# Patient Record
Sex: Female | Born: 1970 | Race: White | Hispanic: No | Marital: Single | Smoking: Never smoker
Health system: Southern US, Community
[De-identification: ages and names within clinical notes are randomized; demographics above are authoritative.]

## PROBLEM LIST (undated history)

## (undated) DIAGNOSIS — F419 Anxiety disorder, unspecified: Secondary | ICD-10-CM

## (undated) DIAGNOSIS — I739 Peripheral vascular disease, unspecified: Secondary | ICD-10-CM

## (undated) DIAGNOSIS — Z9289 Personal history of other medical treatment: Secondary | ICD-10-CM

## (undated) DIAGNOSIS — E781 Pure hyperglyceridemia: Secondary | ICD-10-CM

## (undated) DIAGNOSIS — F329 Major depressive disorder, single episode, unspecified: Secondary | ICD-10-CM

## (undated) DIAGNOSIS — N200 Calculus of kidney: Secondary | ICD-10-CM

## (undated) DIAGNOSIS — E7800 Pure hypercholesterolemia, unspecified: Secondary | ICD-10-CM

## (undated) DIAGNOSIS — K760 Fatty (change of) liver, not elsewhere classified: Secondary | ICD-10-CM

## (undated) HISTORY — DX: Peripheral vascular disease, unspecified: I73.9

## (undated) HISTORY — DX: Pure hyperglyceridemia: E78.1

## (undated) HISTORY — DX: Pure hypercholesterolemia, unspecified: E78.00

## (undated) HISTORY — DX: Calculus of kidney: N20.0

## (undated) HISTORY — DX: Anxiety disorder, unspecified: F41.9

## (undated) HISTORY — DX: Major depressive disorder, single episode, unspecified: F32.9

## (undated) HISTORY — DX: Personal history of other medical treatment: Z92.89

---

## 2006-08-16 ENCOUNTER — Ambulatory Visit: Payer: Self-pay | Admitting: Internal Medicine

## 2008-09-08 ENCOUNTER — Ambulatory Visit: Payer: Self-pay

## 2014-01-07 DIAGNOSIS — Z9289 Personal history of other medical treatment: Secondary | ICD-10-CM

## 2014-01-07 HISTORY — DX: Personal history of other medical treatment: Z92.89

## 2014-01-07 LAB — HM PAP SMEAR: HM Pap smear: NEGATIVE

## 2014-02-08 DIAGNOSIS — F32A Depression, unspecified: Secondary | ICD-10-CM

## 2014-02-08 HISTORY — DX: Depression, unspecified: F32.A

## 2015-02-17 DIAGNOSIS — N200 Calculus of kidney: Secondary | ICD-10-CM

## 2015-02-17 HISTORY — DX: Calculus of kidney: N20.0

## 2015-02-21 ENCOUNTER — Ambulatory Visit (INDEPENDENT_AMBULATORY_CARE_PROVIDER_SITE_OTHER): Payer: 59

## 2015-02-21 ENCOUNTER — Ambulatory Visit
Admission: EM | Admit: 2015-02-21 | Discharge: 2015-02-21 | Disposition: A | Discharge status: Home / Self Care | Payer: 59 | Attending: Family Medicine | Admitting: Family Medicine

## 2015-02-21 DIAGNOSIS — N200 Calculus of kidney: Secondary | ICD-10-CM

## 2015-02-21 DIAGNOSIS — R109 Unspecified abdominal pain: Secondary | ICD-10-CM

## 2015-02-21 LAB — CBC WITH DIFFERENTIAL/PLATELET
BASOS ABS: 0.1 10*3/uL (ref 0–0.1)
Basophils Relative: 1 %
Eosinophils Absolute: 0.1 10*3/uL (ref 0–0.7)
Eosinophils Relative: 1 %
HEMATOCRIT: 39.3 % (ref 35.0–47.0)
Hemoglobin: 13.8 g/dL (ref 12.0–16.0)
LYMPHS ABS: 2.5 10*3/uL (ref 1.0–3.6)
LYMPHS PCT: 18 %
MCH: 29.7 pg (ref 26.0–34.0)
MCHC: 35 g/dL (ref 32.0–36.0)
MCV: 85.1 fL (ref 80.0–100.0)
MONO ABS: 0.5 10*3/uL (ref 0.2–0.9)
Monocytes Relative: 4 %
NEUTROS ABS: 10.7 10*3/uL — AB (ref 1.4–6.5)
Neutrophils Relative %: 76 %
Platelets: 271 10*3/uL (ref 150–440)
RBC: 4.63 MIL/uL (ref 3.80–5.20)
RDW: 13.5 % (ref 11.5–14.5)
WBC: 13.9 10*3/uL — ABNORMAL HIGH (ref 3.6–11.0)

## 2015-02-21 LAB — URINALYSIS COMPLETE WITH MICROSCOPIC (ARMC ONLY)
BACTERIA UA: NONE SEEN
BILIRUBIN URINE: NEGATIVE
GLUCOSE, UA: NEGATIVE mg/dL
Ketones, ur: NEGATIVE mg/dL
LEUKOCYTES UA: NEGATIVE
NITRITE: NEGATIVE
Protein, ur: NEGATIVE mg/dL
SPECIFIC GRAVITY, URINE: 1.02 (ref 1.005–1.030)
WBC, UA: NONE SEEN WBC/hpf (ref 0–5)
pH: 5.5 (ref 5.0–8.0)

## 2015-02-21 LAB — BASIC METABOLIC PANEL
ANION GAP: 10 (ref 5–15)
BUN: 13 mg/dL (ref 6–20)
CO2: 26 mmol/L (ref 22–32)
Calcium: 9.7 mg/dL (ref 8.9–10.3)
Chloride: 98 mmol/L — ABNORMAL LOW (ref 101–111)
Creatinine, Ser: 0.77 mg/dL (ref 0.44–1.00)
GFR calc Af Amer: >60 mL/min (ref >60)
GFR calc non Af Amer: >60 mL/min (ref >60)
GLUCOSE: 105 mg/dL — AB (ref 65–99)
POTASSIUM: 4.4 mmol/L (ref 3.5–5.1)
Sodium: 134 mmol/L — ABNORMAL LOW (ref 135–145)

## 2015-02-21 LAB — PREGNANCY, URINE: PREG TEST UR: NEGATIVE

## 2015-02-21 MED ORDER — KETOROLAC TROMETHAMINE 60 MG/2ML IM SOLN
60.0000 mg | Freq: Once | INTRAMUSCULAR | Status: AC
  Administered 2015-02-21: 60 mg via INTRAMUSCULAR

## 2015-02-21 MED ORDER — TAMSULOSIN HCL 0.4 MG PO CAPS: 0.4000 mg | ORAL_CAPSULE | Freq: Every day | ORAL | Status: DC

## 2015-02-21 MED ORDER — OXYCODONE-ACETAMINOPHEN 5-325 MG PO TABS: 1.0000 | ORAL_TABLET | Freq: Three times a day (TID) | ORAL | Status: DC | PRN

## 2015-02-21 NOTE — Discharge Instructions (Signed)
Take medication as prescribed. Rest. Drink plenty of fluids. Use urine strainer.  Follow-up the primary care physician tomorrow as scheduled.  Return to urgent care proceed to ER for fever, inability to eat or drink, increased pain, difficulty urinating, new or worsening concerns.  Flank Pain Flank pain refers to pain that is located on the side of the body between the upper abdomen and the back. The pain may occur over a short period of time (acute) or may be long-term or reoccurring (chronic). It may be mild or severe. Flank pain can be caused by many things. CAUSES  Some of the more common causes of flank pain include:  Muscle strains.   Muscle spasms.   A disease of your spine (vertebral disk disease).   A lung infection (pneumonia).   Fluid around your lungs (pulmonary edema).   A kidney infection.   Kidney stones.   A very painful skin rash caused by the chickenpox virus (shingles).   Gallbladder disease.  HOME CARE INSTRUCTIONS  Home care will depend on the cause of your pain. In general,  Rest as directed by your caregiver.  Drink enough fluids to keep your urine clear or pale yellow.  Only take over-the-counter or prescription medicines as directed by your caregiver. Some medicines may help relieve the pain.  Tell your caregiver about any changes in your pain.  Follow up with your caregiver as directed. SEEK IMMEDIATE MEDICAL CARE IF:   Your pain is not controlled with medicine.   You have new or worsening symptoms.  Your pain increases.   You have abdominal pain.   You have shortness of breath.   You have persistent nausea or vomiting.   You have swelling in your abdomen.   You feel faint or pass out.   You have blood in your urine.  You have a fever or persistent symptoms for more than 2-3 days.  You have a fever and your symptoms suddenly get worse. MAKE SURE YOU:   Understand these instructions.  Will watch your  condition.  Will get help right away if you are not doing well or get worse.   This information is not intended to replace advice given to you by your health care provider. Make sure you discuss any questions you have with your health care provider.   Document Released: 04/26/2005 Document Revised: 11/28/2011 Document Reviewed: 10/18/2011 Elsevier Interactive Patient Education 2016 Elsevier Inc.  Kidney Stones Kidney stones (urolithiasis) are deposits that form inside your kidneys. The intense pain is caused by the stone moving through the urinary tract. When the stone moves, the ureter goes into spasm around the stone. The stone is usually passed in the urine.  CAUSES   A disorder that makes certain neck glands produce too much parathyroid hormone (primary hyperparathyroidism).  A buildup of uric acid crystals, similar to gout in your joints.  Narrowing (stricture) of the ureter.  A kidney obstruction present at birth (congenital obstruction).  Previous surgery on the kidney or ureters.  Numerous kidney infections. SYMPTOMS   Feeling sick to your stomach (nauseous).  Throwing up (vomiting).  Blood in the urine (hematuria).  Pain that usually spreads (radiates) to the groin.  Frequency or urgency of urination. DIAGNOSIS   Taking a history and physical exam.  Blood or urine tests.  CT scan.  Occasionally, an examination of the inside of the urinary bladder (cystoscopy) is performed. TREATMENT   Observation.  Increasing your fluid intake.  Extracorporeal shock wave lithotripsy--This is a  noninvasive procedure that uses shock waves to break up kidney stones.  Surgery may be needed if you have severe pain or persistent obstruction. There are various surgical procedures. Most of the procedures are performed with the use of small instruments. Only small incisions are needed to accommodate these instruments, so recovery time is minimized. The size, location, and  chemical composition are all important variables that will determine the proper choice of action for you. Talk to your health care provider to better understand your situation so that you will minimize the risk of injury to yourself and your kidney.  HOME CARE INSTRUCTIONS   Drink enough water and fluids to keep your urine clear or pale yellow. This will help you to pass the stone or stone fragments.  Strain all urine through the provided strainer. Keep all particulate matter and stones for your health care provider to see. The stone causing the pain may be as small as a grain of salt. It is very important to use the strainer each and every time you pass your urine. The collection of your stone will allow your health care provider to analyze it and verify that a stone has actually passed. The stone analysis will often identify what you can do to reduce the incidence of recurrences.  Only take over-the-counter or prescription medicines for pain, discomfort, or fever as directed by your health care provider.  Keep all follow-up visits as told by your health care provider. This is important.  Get follow-up X-rays if required. The absence of pain does not always mean that the stone has passed. It may have only stopped moving. If the urine remains completely obstructed, it can cause loss of kidney function or even complete destruction of the kidney. It is your responsibility to make sure X-rays and follow-ups are completed. Ultrasounds of the kidney can show blockages and the status of the kidney. Ultrasounds are not associated with any radiation and can be performed easily in a matter of minutes.  Make changes to your daily diet as told by your health care provider. You may be told to:  Limit the amount of salt that you eat.  Eat 5 or more servings of fruits and vegetables each day.  Limit the amount of meat, poultry, fish, and eggs that you eat.  Collect a 24-hour urine sample as told by your  health care provider.You may need to collect another urine sample every 6-12 months. SEEK MEDICAL CARE IF:  You experience pain that is progressive and unresponsive to any pain medicine you have been prescribed. SEEK IMMEDIATE MEDICAL CARE IF:   Pain cannot be controlled with the prescribed medicine.  You have a fever or shaking chills.  The severity or intensity of pain increases over 18 hours and is not relieved by pain medicine.  You develop a new onset of abdominal pain.  You feel faint or pass out.  You are unable to urinate.   This information is not intended to replace advice given to you by your health care provider. Make sure you discuss any questions you have with your health care provider.   Document Released: 03/05/2005 Document Revised: 11/24/2014 Document Reviewed: 08/06/2012 Elsevier Interactive Patient Education [employer]2016 Elsevier Inc.

## 2015-02-21 NOTE — ED Provider Notes (Signed)
[facility]  ____________________________________________  Time seen: Approximately 2035 PM  I have reviewed the triage vital signs and the nursing notes.   HISTORY  Chief Complaint Urinary Frequency   HPI Monica Long is a 44 y.o. femalepresents for the complaint of one day of urinary frequency. Patient reports that she's been feeling that she frequently needs to void, and states that when she does urinate she feels like it's smaller amounts. Patient states that this afternoon she then went to the drugstore to get over-the-counter AZO and states prior to taking that, on the way out of the store she began to have sharp stabbing right flank pain. States that that lasted for about 30 minutes to an hour and then eased up. Patient states that she does have some mild right flank pain at this time at 4 out of 10. States it is more of an aching pain at this time. Denies aggravating factors except for increased pain when palpated in that area. Denies changes in pain with range of motion or movements. Denies fall or trauma.  Denies vaginal discharge, vaginal complaints. Denies blood in urine. Denies burning with urination. Patient reports a history of one urinary tract infection many many years ago. Patient denies history of kidney stones. States that she did have some nausea with increased pain but denies nausea at this time. Denies vomiting or diarrhea. Denies other changes or other complaints.   No past medical history on file.  There are no active problems to display for this patient.   Past Surgical History  Procedure Laterality Date  . No past surgeries      Current Outpatient Rx  Name  Route  Sig  Dispense  Refill  . amoxicillin (AMOXIL) 500 MG capsule   Oral   Take 500 mg by mouth 3 (three) times daily.         .           .             Allergies Review of patient's allergies indicates no known allergies.  No family history on file.  Social History Social  History  Substance Use Topics  . Smoking status: Never Smoker   . Smokeless tobacco: None  . Alcohol Use: No    Review of Systems Constitutional: No fever/chills Eyes: No visual changes. ENT: No sore throat. Cardiovascular: Denies chest pain. Respiratory: Denies shortness of breath. Gastrointestinal: No abdominal pain.  No nausea, no vomiting.  No diarrhea.  No constipation. Genitourinary: positive for dysuria. Musculoskeletal: Negative for back pain. Right flank pain.  Skin: Negative for rash. Neurological: Negative for headaches, focal weakness or numbness.  10-point ROS otherwise negative.  ____________________________________________   PHYSICAL EXAM:  VITAL SIGNS: ED Triage Vitals  Enc Vitals Group     BP 02/21/15 1944 131/79 mmHg     Pulse Rate 02/21/15 1944 74     Resp 02/21/15 1944 16     Temp 02/21/15 1944 97.4 F (36.3 C)     Temp Source 02/21/15 1944 Oral     SpO2 02/21/15 1944 100 %     Weight 02/21/15 1944 138 lb (62.596 kg)     Height 02/21/15 1944  (1.702 m)     Head Cir --      Peak Flow --      Pain Score 02/21/15 1947 10     Pain Loc --      Pain Edu? --      Excl. in GC? --  Constitutional: Alert and oriented. Well appearing and in no acute distress. Eyes: Conjunctivae are normal. PERRL. EOMI. Head: Atraumatic.  Ears: no erythema, normal TMs bilaterally.   Nose: No congestion/rhinnorhea.  Mouth/Throat: Mucous membranes are moist.  Oropharynx non-erythematous. Neck: No stridor.  No cervical spine tenderness to palpation. Hematological/Lymphatic/Immunilogical: No cervical lymphadenopathy. Cardiovascular: Normal rate, regular rhythm. Grossly normal heart sounds.  Good peripheral circulation. Respiratory: Normal respiratory effort.  No retractions. Lungs CTAB. Gastrointestinal: Soft and nontender. No distention. Normal Bowel sounds. No left CVA tenderness. Moderate tenderness to palpation right CVA. Musculoskeletal: No lower or upper  extremity tenderness nor edema.  . Bilateral pedal pulses equal and easily palpated. No pain with straight leg raises.  Neurologic:  Normal speech and language. No gross focal neurologic deficits are appreciated. No gait instability. Skin:  Skin is warm, dry and intact. No rash noted. Psychiatric: Mood and affect are normal. Speech and behavior are normal.  ____________________________________________   LABS (all labs ordered are listed, but only abnormal results are displayed)  Labs Reviewed  URINALYSIS COMPLETEWITH MICROSCOPIC ([facility] ONLY) - Abnormal; Notable for the following:    Hgb urine dipstick 3+ (*)    Squamous Epithelial / LPF 0-5 (*)    All other components within normal limits  CBC WITH DIFFERENTIAL/PLATELET - Abnormal; Notable for the following:    WBC 13.9 (*)    Neutro Abs 10.7 (*)    All other components within normal limits  BASIC METABOLIC PANEL - Abnormal; Notable for the following:    Sodium 134 (*)    Chloride 98 (*)    Glucose, Bld 105 (*)    All other components within normal limits  URINE CULTURE  PREGNANCY, URINE    RADIOLOGY  EXAM: ABDOMEN - 1 VIEW  COMPARISON: None.  FINDINGS: Scattered large and small bowel gas is noted. Calcifications are noted within the pelvis. These likely represent phleboliths although a larger right-sided pelvic stone is noted which could represent a distal ureteral stone. No other focal abnormality is seen. No bony abnormality is noted.  IMPRESSION: Calcifications within the pelvis, 1 of which could represent a distal right ureteral stone.   Electronically Signed By: Alcide CleverMark Lukens M.D. On: 02/21/2015 20:45  I, Renford DillsLindsey Kimyetta Flott, personally viewed and evaluated these images (plain radiographs) as part of my medical decision making.   ____________________________________________  INITIAL IMPRESSION / ASSESSMENT AND PLAN / ED COURSE  Pertinent labs & imaging results that were available during my care of the  patient were reviewed by me and considered in my medical decision making (see chart for details).  Very well-appearing patient. No acute distress. Sitting comfortably on exam table. Changes position from lying to sitting without distress or discomfort. Patient presents for 2 days history of urinary frequency as well as some right flank pain. Positive right CVA tenderness. Will evaluate urine. Suspect urinary tract infection versus right nephrolithiasis. Will also evaluate KUB.  Urinalysis positive for 3+ hemoglobin and 6-30 RBCs, negative for bacteria, will culture urine. Patient KUB reviewed.per radiologist, KUB with calcifications within the pelvis, one of which could represent a distal right ureteral stone. Labs reviewed. Patient tolerating oral fluids well in room. 60 mg IM Toradol 1 in urgent care.  Patient reports that post IM Toradol pain is now 2 out of 10. Continues to tolerate fluids well. Suspect right nephrolithiasis. Urine strainer given to patient. Will treat with oral when necessary Percocet as well as Flomax. Patient reports that she has a yearly physical with her primary care physician  in the morning and will follow up with PCP tomorrow. Patient stable and well appearing and will have close PCP follow up.   Discussed follow up with Primary care physician this week. Discussed follow up and return parameters including fever, increased pain, inability to tolerate food or fluids, no resolution or any worsening concerns. Patient verbalized understanding and agreed to plan.   ____________________________________________   FINAL CLINICAL IMPRESSION(S) / ED DIAGNOSES  Final diagnoses:  Kidney stone  Right flank pain       Renford Dills, NP 02/21/15 2207

## 2015-02-21 NOTE — ED Notes (Signed)
Patient complains of urinary frequency that started around 10am. She states that she went to [facility] to get AZO then she had a sharp stabbing pain that happened in her right flank that radiated to her back. She states that pain has subsided now but, is still having the frequency with decreased output.

## 2015-02-23 LAB — URINE CULTURE
CULTURE: NO GROWTH
Special Requests: NORMAL

## 2015-02-24 NOTE — ED Notes (Signed)
Final report of urine C&S negative for pathogens

## 2015-05-09 LAB — HM MAMMOGRAPHY

## 2016-06-19 ENCOUNTER — Ambulatory Visit (INDEPENDENT_AMBULATORY_CARE_PROVIDER_SITE_OTHER): Payer: 59 | Admitting: Vascular Surgery

## 2016-06-19 ENCOUNTER — Encounter (INDEPENDENT_AMBULATORY_CARE_PROVIDER_SITE_OTHER): Payer: Self-pay | Admitting: Vascular Surgery

## 2016-06-19 VITALS — BP 123/72 | HR 91 | Resp 16 | Ht 67.0 in | Wt 147.0 lb

## 2016-06-19 DIAGNOSIS — I83813 Varicose veins of bilateral lower extremities with pain: Secondary | ICD-10-CM | POA: Insufficient documentation

## 2016-06-19 DIAGNOSIS — M7989 Other specified soft tissue disorders: Secondary | ICD-10-CM | POA: Diagnosis not present

## 2016-06-19 NOTE — Progress Notes (Signed)
Patient ID: Monica Long, female   DOB: 02/21/1971, 46 y.o.   MRN: [id number]  Chief Complaint  Patient presents with  . New Evaluation    Varicose veins    HPI Monica Long is a 46 y.o. female. The patient presents with complaints of symptomatic varicosities of the lower extremities. The patient reports a long standing history of varicosities and they have become painful over time. There was no clear inciting event or causative factor that started the symptoms.  The right leg is slightly more severly affected. The patient elevates the legs for relief. The pain is described as heaviness and aching in the legs. The symptoms are generally most severe in the evening, particularly when they have been on their feet for long periods of time. Elevation has been used to try to improve the symptoms with some success. The patient complains of nighttime swelling as an associated symptom. The patient has no previous history of deep venous thrombosis or superficial thrombophlebitis to their knowledge.     Past Medical History:  Diagnosis Date  . Peripheral vascular disease [facility])     Past Surgical History:  Procedure Laterality Date  . NO PAST SURGERIES      Family History No bleeding disorders, clotting disorders, autoimmune diseases, or porphyrias  Social History Social History  Substance Use Topics  . Smoking status: Never Smoker  . Smokeless tobacco: Never Used  . Alcohol use No  No IV drug use  No Known Allergies  Current Outpatient Prescriptions  Medication Sig Dispense Refill  . drospirenone-ethinyl estradiol (YAZ,GIANVI,LORYNA) 3-0.02 MG tablet Take 1 tablet by mouth daily.    . tamsulosin (FLOMAX) 0.4 MG CAPS capsule Take 1 capsule (0.4 mg total) by mouth daily. 10 capsule 0   No current facility-administered medications for this visit.       REVIEW OF SYSTEMS (Negative unless checked)  Constitutional: Weight loss  Fever  Chills Cardiac: Chest pain    Chest pressure   Palpitations   Shortness of breath when laying flat   Shortness of breath at rest   Shortness of breath with exertion. Vascular:  Pain in legs with walking   Pain in legs at rest   Pain in legs when laying flat   Claudication   Pain in feet when walking  Pain in feet at rest  Pain in feet when laying flat   History of DVT   Phlebitis   Swelling in legs   Varicose veins   Non-healing ulcers Pulmonary:   Uses home oxygen   Productive cough   Hemoptysis   Wheeze  COPD   Asthma Neurologic:  Dizziness  Blackouts   Seizures   History of stroke   History of TIA  Aphasia   Temporary blindness   Dysphagia   Weakness or numbness in arms   Weakness or numbness in legs Musculoskeletal:  Arthritis   Joint swelling   Joint pain   Low back pain Hematologic:  Easy bruising  Easy bleeding   Hypercoagulable state   Anemic  Hepatitis Gastrointestinal:  Blood in stool   Vomiting blood  Gastroesophageal reflux/heartburn   Abdominal pain Genitourinary:  Chronic kidney disease   Difficult urination  Frequent urination  Burning with urination   Hematuria Skin:  Rashes   Ulcers   Wounds Psychological:  History of anxiety    History of major depression.    Physical Exam BP 123/72 (BP Location: Right Arm)   Pulse 91   Resp 16  Ht  (1.702 m)   Wt 147 lb (66.7 kg)   BMI 23.02 kg/m  Gen:  WD/WN, NAD Head: [place]/AT, No temporalis wasting.  Ear/Nose/Throat: Hearing grossly intact, dentition good Eyes: Sclera non-icteric. Conjunctiva clear Neck: Supple, no nuchal rigidity. Trachea midline Pulmonary:  Good air movement, no use of accessory muscles, respirations not labored.  Cardiac: RRR, No JVD Vascular: Varicosities scattered and measuring up to 2 mm in the right lower extremity        Varicosities scattered and measuring up to 2 mm in the left lower extremity Vessel Right Left  Radial  Palpable Palpable  Ulnar Palpable Palpable  Brachial Palpable Palpable  Carotid Palpable, without bruit Palpable, without bruit  Aorta Not palpable N/A  Femoral Palpable Palpable  Popliteal Palpable Palpable  PT Palpable Palpable  DP Palpable Palpable   Gastrointestinal: soft, non-tender/non-distended. No guarding/reflex. No masses, surgical incisions, or scars. Musculoskeletal: M/S 5/5 throughout. No LE edema today Neurologic: Sensation grossly intact in extremities.  Symmetrical.  Speech is fluent.  Psychiatric: Judgment intact, Mood & affect appropriate for pt's clinical situation. Dermatologic: No rashes or ulcers noted.  No cellulitis or open wounds. Lymph : No Cervical, Axillary, or Inguinal lymphadenopathy.   Radiology No results found.  Labs No results found for this or any previous visit (from the past 2160 hour(s)).  Assessment/Plan:  Varicose veins of leg with pain, bilateral See below  Swelling of limb Possibly from venous disease. See treatment plan as below.    The patient has symptoms consistent with chronic venous insufficiency. We discussed the natural history and treatment options for venous disease. I recommended the regular use of 20 - 30 mm Hg compression stockings, and prescribed these today. I recommended leg elevation and anti-inflammatories as needed for pain. I have also recommended a complete venous duplex to assess the venous system for reflux or thrombotic issues. This can be done at the patient's convenience. I will see the patient back in 3 months to assess the response to conservative management, and determine further treatment options.     Festus Barren 06/19/2016, 9:56 AM   This note was created with Dragon medical transcription system.  Any errors from dictation are unintentional.

## 2016-06-19 NOTE — Assessment & Plan Note (Signed)
Possibly from venous disease. See treatment plan as below.

## 2016-06-19 NOTE — Assessment & Plan Note (Signed)
See below

## 2016-07-10 ENCOUNTER — Ambulatory Visit (INDEPENDENT_AMBULATORY_CARE_PROVIDER_SITE_OTHER): Payer: 59 | Admitting: Obstetrics and Gynecology

## 2016-07-10 ENCOUNTER — Encounter: Payer: Self-pay | Admitting: Obstetrics and Gynecology

## 2016-07-10 VITALS — BP 110/70 | HR 79 | Ht 67.0 in | Wt 143.0 lb

## 2016-07-10 DIAGNOSIS — F419 Anxiety disorder, unspecified: Secondary | ICD-10-CM

## 2016-07-10 DIAGNOSIS — F329 Major depressive disorder, single episode, unspecified: Secondary | ICD-10-CM | POA: Diagnosis not present

## 2016-07-10 DIAGNOSIS — Z1239 Encounter for other screening for malignant neoplasm of breast: Secondary | ICD-10-CM

## 2016-07-10 DIAGNOSIS — Z1151 Encounter for screening for human papillomavirus (HPV): Secondary | ICD-10-CM

## 2016-07-10 DIAGNOSIS — Z1231 Encounter for screening mammogram for malignant neoplasm of breast: Secondary | ICD-10-CM | POA: Diagnosis not present

## 2016-07-10 DIAGNOSIS — Z124 Encounter for screening for malignant neoplasm of cervix: Secondary | ICD-10-CM

## 2016-07-10 DIAGNOSIS — Z01419 Encounter for gynecological examination (general) (routine) without abnormal findings: Secondary | ICD-10-CM | POA: Diagnosis not present

## 2016-07-10 DIAGNOSIS — Z3041 Encounter for surveillance of contraceptive pills: Secondary | ICD-10-CM

## 2016-07-10 MED ORDER — BUPROPION HCL ER (SR) 150 MG PO TB12: ORAL_TABLET | ORAL | 1 refills | Status: DC

## 2016-07-10 MED ORDER — ALPRAZOLAM 0.25 MG PO TABS: 0.2500 mg | ORAL_TABLET | Freq: Two times a day (BID) | ORAL | 0 refills | Status: DC | PRN

## 2016-07-10 MED ORDER — DROSPIRENONE-ETHINYL ESTRADIOL 3-0.02 MG PO TABS: 1.0000 | ORAL_TABLET | Freq: Every day | ORAL | 0 refills | Status: DC

## 2016-07-10 MED ORDER — DROSPIRENONE-ETHINYL ESTRADIOL 3-0.02 MG PO TABS: 1.0000 | ORAL_TABLET | Freq: Every day | ORAL | 3 refills | Status: DC

## 2016-07-10 NOTE — Progress Notes (Signed)
Chief Complaint  Patient presents with  . Gynecologic Exam     HPI:      [name] is a 46 y.o. G1P0101 who LMP was Patient's last menstrual period was 07/06/2016 (exact date)., presents today for her annual examination.  Her menses are every 1-2 months on OCPs, lasting 2 days.  Dysmenorrhea none. She does not have intermenstrual bleeding.  Sex activity: not sexually active.  Last Pap: January 07, 2014  Results were: no abnormalities /neg HPV DNA  Hx of STDs: none  Last mammogram: May 09, 2015  Results were: normal--routine follow-up in 12 months There is no FH of breast cancer. There is no FH of ovarian cancer. The patient does do self-breast exams.  Tobacco use: The patient denies current or previous tobacco use. Alcohol use: none Exercise: moderately active  She does not get adequate calcium and Vitamin D in her diet.  She has a hx of anxeity and depression for which she took wellbutrin with occas xanax use with good sx control. She had increased life stressors at the time. She stopped the meds about 6 months ago but the anxiety/depression sx have recurrered. She changed jobs about 3 months ago and this has increased her worry/feeling overwhelmed/anxiety and depression sx. She denies any SI. She would like to restart meds.   Past Medical History:  Diagnosis Date  . Anxiety   . Depression   . Hypercholesteremia   . Hypertriglyceridemia   . Peripheral vascular disease [facility])     Past Surgical History:  Procedure Laterality Date  . CESAREAN SECTION  2003    Family History  Problem Relation Age of Onset  . Hypertension Father   . Colon cancer Maternal Grandmother 55    Social History   Social History  . Marital status: Single    Spouse name: N/A  . Number of children: N/A  . Years of education: N/A   Occupational History  . Not on file.   Social History Main Topics  . Smoking status: Never Smoker  . Smokeless tobacco: Never Used  . Alcohol use  No  . Drug use: No  . Sexual activity: Not Currently    Birth control/ protection: Pill   Other Topics Concern  . Not on file   Social History Narrative  . No narrative on file     Current Outpatient Prescriptions:  .  ALPRAZolam (XANAX) 0.25 MG tablet, Take 1 tablet (0.25 mg total) by mouth 2 (two) times daily as needed for anxiety., Disp: 30 tablet, Rfl: 0 .  buPROPion (WELLBUTRIN SR) 150 MG 12 hr tablet, Take 1 tablet daily in AM for 3 days, then 1 tablet BID, Disp: 60 tablet, Rfl: 1 .  drospirenone-ethinyl estradiol (YAZ) 3-0.02 MG tablet, Take 1 tablet by mouth daily., Disp: 3 Package, Rfl: 3 .  drospirenone-ethinyl estradiol (YAZ,GIANVI,LORYNA) 3-0.02 MG tablet, Take 1 tablet by mouth daily., Disp: 1 Package, Rfl: 0  ROS:  Review of Systems  Constitutional: Negative for fever, malaise/fatigue and weight loss.  HENT: Negative for congestion, ear pain and sinus pain.   Respiratory: Negative for cough, shortness of breath and wheezing.   Cardiovascular: Negative for chest pain, orthopnea and leg swelling.  Gastrointestinal: Negative for constipation, diarrhea, nausea and vomiting.  Genitourinary: Negative for dysuria, frequency, hematuria and urgency.       Breast ROS: negative   Musculoskeletal: Negative for back pain, joint pain and myalgias.  Skin: Negative for itching and rash.  Neurological: Negative for dizziness,  tingling, focal weakness and headaches.  Endo/Heme/Allergies: Negative for environmental allergies. Does not bruise/bleed easily.  Psychiatric/Behavioral: Positive for depression. Negative for suicidal ideas. The patient is nervous/anxious. The patient does not have insomnia.     Objective: BP 110/70   Pulse 79   Ht  (1.702 m)   Wt 143 lb (64.9 kg)   LMP 07/06/2016 (Exact Date)   BMI 22.40 kg/m    Physical Exam  Constitutional: She is oriented to person, place, and time. She appears well-developed and well-nourished.  Genitourinary: Vagina  normal and uterus normal. No erythema or tenderness in the vagina. No vaginal discharge found. Right adnexum does not display mass and does not display tenderness. Left adnexum does not display mass and does not display tenderness. Cervix does not exhibit motion tenderness or polyp. Uterus is not enlarged or tender.  Neck: Normal range of motion. No thyromegaly present.  Cardiovascular: Normal rate, regular rhythm and normal heart sounds.   No murmur heard. Pulmonary/Chest: Effort normal and breath sounds normal. Right breast exhibits no mass, no nipple discharge, no skin change and no tenderness. Left breast exhibits no mass, no nipple discharge, no skin change and no tenderness.  Abdominal: Soft. There is no tenderness. There is no guarding.  Musculoskeletal: Normal range of motion.  Neurological: She is alert and oriented to person, place, and time. No cranial nerve deficit.  Psychiatric: She has a normal mood and affect. Her behavior is normal.  Vitals reviewed.  Assessment/Plan: Encounter for annual routine gynecological examination  Cervical cancer screening - Plan: IGP, Aptima HPV  Screening for HPV (human papillomavirus) - Plan: IGP, Aptima HPV  Screening for breast cancer - Pt to sched mammo.  - Plan: MM DIGITAL SCREENING BILATERAL  Anxiety and depression - Restart wellbutrin and prn xanax. Rx sent [facility]. Pt to f/u via phone in 2 months with sx since did well with them in the past.   Encounter for surveillance of contraceptive pills - [facility] mo [facility].             GYN counsel mammography screening, adequate intake of calcium and vitamin D, diet and exercise     F/U  Return in about 1 year (around 07/10/2017).  Aylen Stradford B. Jae Skeet, PA-C 07/10/2016 4:32 PM

## 2016-07-13 LAB — IGP, APTIMA HPV
HPV Aptima: NEGATIVE
PAP Smear Comment: 0

## 2016-08-09 ENCOUNTER — Other Ambulatory Visit: Payer: Self-pay | Admitting: Obstetrics and Gynecology

## 2016-08-21 ENCOUNTER — Telehealth: Payer: Self-pay

## 2016-08-21 NOTE — Telephone Encounter (Signed)
Wellbutrin isn't that good for anxiety sx. I put her on zoloft in 2015 but can't see who/why she changed to wellbutrin. Pt needs appt. RN to discuss with pt.

## 2016-08-21 NOTE — Telephone Encounter (Signed)
Pt calling about welbutrin.  She needs something else b/c her panic attacks are worse and she takes 2-3 xanax a day and doesn't really want to continue to do that.  [phone]

## 2016-08-22 NOTE — Telephone Encounter (Signed)
Pt aware and will call at lunch to sched appt.

## 2016-09-08 ENCOUNTER — Other Ambulatory Visit: Payer: Self-pay | Admitting: Obstetrics and Gynecology

## 2016-09-09 ENCOUNTER — Other Ambulatory Visit: Payer: Self-pay | Admitting: Obstetrics and Gynecology

## 2016-09-12 ENCOUNTER — Telehealth: Payer: Self-pay

## 2016-09-12 NOTE — Telephone Encounter (Signed)
Adv pt on 07/11/16 bc rx was sent to [facility].

## 2016-09-12 NOTE — Telephone Encounter (Signed)
Pt calling to adv bc needs to be sent thru [facility] not local pharm.  Called to adv pt [facility] but mailbox was full.

## 2016-09-13 ENCOUNTER — Other Ambulatory Visit: Payer: Self-pay | Admitting: Obstetrics and Gynecology

## 2016-09-18 ENCOUNTER — Encounter (INDEPENDENT_AMBULATORY_CARE_PROVIDER_SITE_OTHER): Payer: 59

## 2016-09-18 ENCOUNTER — Ambulatory Visit (INDEPENDENT_AMBULATORY_CARE_PROVIDER_SITE_OTHER): Payer: 59 | Admitting: Vascular Surgery

## 2016-10-15 ENCOUNTER — Ambulatory Visit (INDEPENDENT_AMBULATORY_CARE_PROVIDER_SITE_OTHER): Payer: 59 | Admitting: Obstetrics and Gynecology

## 2016-10-15 ENCOUNTER — Encounter: Payer: Self-pay | Admitting: Obstetrics and Gynecology

## 2016-10-15 VITALS — BP 120/80 | HR 84 | Ht 67.0 in | Wt 147.0 lb

## 2016-10-15 DIAGNOSIS — F419 Anxiety disorder, unspecified: Secondary | ICD-10-CM | POA: Diagnosis not present

## 2016-10-15 DIAGNOSIS — F329 Major depressive disorder, single episode, unspecified: Secondary | ICD-10-CM

## 2016-10-15 MED ORDER — ALPRAZOLAM 0.25 MG PO TABS: 0.2500 mg | ORAL_TABLET | Freq: Two times a day (BID) | ORAL | 0 refills | Status: DC | PRN

## 2016-10-15 MED ORDER — SERTRALINE HCL 50 MG PO TABS: 50.0000 mg | ORAL_TABLET | Freq: Every day | ORAL | 1 refills | Status: DC

## 2016-10-15 NOTE — Progress Notes (Signed)
Chief Complaint  Patient presents with  . Follow-up    HPI:      [name] is a 46 y.o. G1P0101 who LMP was Patient's last menstrual period was 09/20/2016., presents today for anxiety/depression f/u. She was started on wellbutrin and prn xanax at her 4/18 annual for anxiety/depression sx. Pt had been on wellbutrin in the past for similar sx and did well. Pt states wellbutrin is not controlling anxiety sx and she is having 2-3 panic attacks daily, particularly triggered by work stress/job duties. She takes xanax with sx relief of panic attacks. She has talked to her supervisor who is going to try to get her into a new role at work. She has depression sx, little energy, excessive worry, trouble relaxing, and irritability. She denies SI. She did zoloft in the past but pt doesn't know why it was stopped. She denies any known side effects with it.      Past Medical History:  Diagnosis Date  . Anxiety    panic attack  . Depression 02/08/2014  . History of mammogram 01/07/2014; 05-09-15   birad 2; neg  . History of Papanicolaou smear of cervix 01/07/2014   -/-  . Hypercholesteremia   . Hypertriglyceridemia   . Kidney stone 02/2015  . Peripheral vascular disease [facility])     Past Surgical History:  Procedure Laterality Date  . CESAREAN SECTION  05/08/2001   x1    Family History  Problem Relation Age of Onset  . Hypertension Father   . Colon cancer Maternal Grandmother [id number]    Social History   Social History  . Marital status: Single    Spouse name: N/A  . Number of children: 1  . Years of education: 7412   Occupational History  .  Lab [employer]   Social History Main Topics  . Smoking status: Never Smoker  . Smokeless tobacco: Never Used  . Alcohol use No  . Drug use: No  . Sexual activity: Yes    Birth control/ protection: Pill   Other Topics Concern  . Not on file   Social History Narrative  . No narrative on file     Current Outpatient Prescriptions:  .   ALPRAZolam (XANAX) 0.25 MG tablet, Take 1 tablet (0.25 mg total) by mouth 2 (two) times daily as needed for anxiety., Disp: 30 tablet, Rfl: 0 .  drospirenone-ethinyl estradiol (YAZ) 3-0.02 MG tablet, Take 1 tablet by mouth daily., Disp: 3 Package, Rfl: 3 .  ibuprofen (ADVIL,MOTRIN) 800 MG tablet, Take 800 mg by mouth every 8 (eight) hours as needed., Disp: , Rfl:  .  LORYNA 3-0.02 MG tablet, TAKE 1 TABLET BY MOUTH DAILY., Disp: 28 tablet, Rfl: 0 .  sertraline (ZOLOFT) 50 MG tablet, Take 1 tablet (50 mg total) by mouth daily. Take 1/2 tab daily for 6 days, then 1 tab daily, Disp: 30 tablet, Rfl: 1   ROS:  Review of Systems  Constitutional: Negative for fever.  Gastrointestinal: Negative for blood in stool, constipation, diarrhea, nausea and vomiting.  Genitourinary: Negative for dyspareunia, dysuria, flank pain, frequency, hematuria, urgency, vaginal bleeding, vaginal discharge and vaginal pain.  Musculoskeletal: Negative for back pain.  Skin: Negative for rash.  Psychiatric/Behavioral: Positive for agitation and dysphoric mood. Negative for confusion, hallucinations, self-injury and suicidal ideas.     OBJECTIVE:   Vitals:  BP 120/80   Pulse 84   Ht 5\' 7"  (1.702 m)   Wt 147 lb (66.7 kg)   LMP 09/20/2016  BMI 23.02 kg/m   Physical Exam  Constitutional: She is oriented to person, place, and time and well-developed, well-nourished, and in no distress.  Neurological: She is alert and oriented to person, place, and time.  Psychiatric: Mood, memory, affect and judgment normal.  Vitals reviewed.   Results: GAD-7-13 PHQ-9=9  Assessment/Plan: Anxiety and depression - Wean off wellbutrin. Start zoloft. F/u via phone in 7 wks with sx/sooner prn. Rx RF xanax prn. Knows to use sparingly and need for it should decrease. - Plan: sertraline (ZOLOFT) 50 MG tablet, ALPRAZolam (XANAX) 0.25 MG tablet    Meds ordered this encounter  Medications  . sertraline (ZOLOFT) 50 MG tablet    Sig:  Take 1 tablet (50 mg total) by mouth daily. Take 1/2 tab daily for 6 days, then 1 tab daily    Dispense:  30 tablet    Refill:  1  . ALPRAZolam (XANAX) 0.25 MG tablet    Sig: Take 1 tablet (0.25 mg total) by mouth 2 (two) times daily as needed for anxiety.    Dispense:  30 tablet    Refill:  0      Return if symptoms worsen or fail to improve.  Monica Cline B. Leylanie Woodmansee, PA-C 10/15/2016 5:05 PM

## 2016-10-19 ENCOUNTER — Telehealth: Payer: Self-pay

## 2016-10-19 NOTE — Telephone Encounter (Signed)
ACCOMMODATION MEDICAL ASSESSMENT FORM filled out for [employer] and given to [place] for processing.  This form is neither FMLA or DISABILITY.  It is for pt to get a new role at work d/t anxiety/depression c current role per [employer].

## 2016-11-13 ENCOUNTER — Ambulatory Visit (INDEPENDENT_AMBULATORY_CARE_PROVIDER_SITE_OTHER): Payer: 59 | Admitting: Vascular Surgery

## 2016-11-13 ENCOUNTER — Encounter (INDEPENDENT_AMBULATORY_CARE_PROVIDER_SITE_OTHER): Payer: Self-pay | Admitting: Vascular Surgery

## 2016-11-13 ENCOUNTER — Ambulatory Visit (INDEPENDENT_AMBULATORY_CARE_PROVIDER_SITE_OTHER): Payer: 59

## 2016-11-13 VITALS — BP 126/84 | HR 76 | Resp 17 | Wt 150.0 lb

## 2016-11-13 DIAGNOSIS — M7989 Other specified soft tissue disorders: Secondary | ICD-10-CM

## 2016-11-13 DIAGNOSIS — I83813 Varicose veins of bilateral lower extremities with pain: Secondary | ICD-10-CM

## 2016-11-13 NOTE — Assessment & Plan Note (Signed)
Stocking have helped some

## 2016-11-13 NOTE — Patient Instructions (Signed)
Endovenous Ablation Endovenous ablation is a procedure that seals off an abnormally enlarged leg vein (varicose vein). This procedure uses heat from radiofrequency waves or a laser to close off the affected vein. This procedure may be done if the vein is causing pain, swelling, sores on the skin (ulcers), or skin discoloration. Tell a health care provider about:  Any allergies you have.  All medicines you are taking, including vitamins, herbs, eye drops, creams, and over-the-counter medicines.  Any problems you or family members have had with anesthetic medicines.  Any blood disorders you have.  Any surgeries you have had.  Any medical conditions you have.  Whether you are pregnant or may be pregnant. What are the risks? Generally, this is a safe procedure. However, problems may occur, including:  Infection.  Bleeding.  Allergic reactions to medicines.  Damage to other structures.  Numbness or tingling along the leg. This is uncommon, and it is usually temporary.  Vein swelling. This is usually temporary.  Blood clots that form in a deep vein of the leg (deep vein thrombosis, or DVT) and can travel to the lungs (pulmonary embolism, or PE). This is very rare.  What happens before the procedure?  You may have blood tests to make sure that your blood can clot normally.  Ask your health care provider about: ? Changing or stopping your regular medicines. This is especially important if you are taking diabetes medicines or blood thinners. ? Taking medicines such as aspirin and ibuprofen. These medicines can thin your blood. Do not take these medicines before your procedure if your health care provider instructs you not to.  Follow instructions from your health care provider about eating or drinking restrictions.  Plan to have someone take you home after the procedure. What happens during the procedure?  You will lie on an exam table.  To reduce your risk of  infection: ? Your health care team will wash or sanitize their hands. ? Your skin will be washed with soap.  Hair may be removed from the surgical area.  Your health care provider will use an imaging tool that uses sound waves (ultrasonogram) to show images of your leg veins.  You will be given medicine to numb the area (local anesthetic).  A small cut (incision) will be made near the area that will be treated. A narrow tube (catheter) will be slipped through the incision and into the vein.  Small sensors (electrodes or laser fibers) will be passed through the catheter and into the vein.  Radiofrequency or laser energy will be sent through the sensors to burn the vein. This seals off the vein.  The electrodes, laser fibers, and catheter will be removed from the vein.  A bandage (dressing) will be placed over the incision. The procedure may vary among health care providers and hospitals. What happens after the procedure?  You may have to wear compression stockings. These stockings help to prevent blood clots and reduce swelling in your legs.  You will be encouraged to walk around immediately after the procedure. This information is not intended to replace advice given to you by your health care provider. Make sure you discuss any questions you have with your health care provider. Document Released: 02/22/2011 Document Revised: 08/11/2015 Document Reviewed: 06/08/2014 Elsevier Interactive Patient Education  2018 Elsevier Inc.  

## 2016-11-13 NOTE — Progress Notes (Signed)
Patient ID: Monica Long, female   DOB: February 27, 1971, 46 y.o.   MRN: [id number]  Chief Complaint  Patient presents with  . ultrasound follow up    HPI Monica Long is a 46 y.o. female.  Patient returns in follow up of their venous disease.  They have done their best to comply with the prescribed conservative therapies of compression stockings, leg elevation, exercise, and still requires anti-inflammatories for discomfort and has symptoms that are persistent and bothersome on a daily basis, affecting their activities of daily living and normal activities. She does think the stockings helped the swelling, but not really the pain. The venous reflux study demonstrates bilateral great saphenous vein and small saphenous vein reflux. No DVT or superficial thrombophlebitis was identified..         Past Medical History:  Diagnosis Date  . Peripheral vascular disease [facility])          Past Surgical History:  Procedure Laterality Date  . NO PAST SURGERIES      Family History No bleeding disorders, clotting disorders, autoimmune diseases, or porphyrias  Social History     Social History  Substance Use Topics  . Smoking status: Never Smoker  . Smokeless tobacco: Never Used  . Alcohol use No  No IV drug use  No Known Allergies        Current Outpatient Prescriptions  Medication Sig Dispense Refill  . drospirenone-ethinyl estradiol (YAZ,GIANVI,LORYNA) 3-0.02 MG tablet Take 1 tablet by mouth daily.    . tamsulosin (FLOMAX) 0.4 MG CAPS capsule Take 1 capsule (0.4 mg total) by mouth daily. 10 capsule 0   No current facility-administered medications for this visit.       REVIEW OF SYSTEMS (Negative unless checked)  Constitutional: [] Weight loss  [] Fever  [] Chills Cardiac: [] Chest pain   [] Chest pressure   [] Palpitations   [] Shortness of breath when laying flat   [] Shortness of breath at rest   [] Shortness of breath with exertion. Vascular:  [] Pain in legs with  walking   [] Pain in legs at rest   [] Pain in legs when laying flat   [] Claudication   [] Pain in feet when walking  [] Pain in feet at rest  [] Pain in feet when laying flat   [] History of DVT   [] Phlebitis   [x] Swelling in legs   [x] Varicose veins   [] Non-healing ulcers Pulmonary:   [] Uses home oxygen   [] Productive cough   [] Hemoptysis   [] Wheeze  [] COPD   [] Asthma Neurologic:  [] Dizziness  [] Blackouts   [] Seizures   [] History of stroke   [] History of TIA  [] Aphasia   [] Temporary blindness   [] Dysphagia   [] Weakness or numbness in arms   [] Weakness or numbness in legs Musculoskeletal:  [] Arthritis   [] Joint swelling   [] Joint pain   [] Low back pain Hematologic:  [] Easy bruising  [] Easy bleeding   [] Hypercoagulable state   [] Anemic  [] Hepatitis Gastrointestinal:  [] Blood in stool   [] Vomiting blood  [] Gastroesophageal reflux/heartburn   [] Abdominal pain Genitourinary:  [] Chronic kidney disease   [] Difficult urination  [] Frequent urination  [] Burning with urination   [] Hematuria Skin:  [] Rashes   [] Ulcers   [] Wounds Psychological:  [] History of anxiety   []  History of major depression.     Physical Exam BP 126/84   Pulse 76   Resp 17   Wt 68 kg (150 lb)   BMI 23.49 kg/m  Gen:  WD/WN, NAD Head: [place]/AT, No temporalis wasting.  Ear/Nose/Throat: Hearing grossly intact, dentition good  Eyes: Sclera non-icteric. Conjunctiva clear Neck: Supple. Trachea midline Pulmonary:  Good air movement, no use of accessory muscles, respirations not labored.  Cardiac: RRR, No JVD Vascular: Varicosities diffuse and measuring up to 2 mm in the right lower extremity        Varicosities diffuse and measuring up to 2-3 mm in the left lower extremity Vessel Right Left  Radial Palpable Palpable                          PT Palpable Palpable  DP Palpable Palpable    Musculoskeletal: M/S 5/5 throughout.   No LE edema seen today Neurologic: Sensation grossly intact in extremities.  Symmetrical.  Speech is  fluent.  Psychiatric: Judgment intact, Mood & affect appropriate for pt's clinical situation. Dermatologic: No rashes or ulcers noted.  No cellulitis or open wounds.    Radiology No results found.  Labs No results found for this or any previous visit (from the past 2160 hour(s)).  Assessment/Plan:  Swelling of limb Stocking have helped some  Varicose veins of leg with pain, bilateral     The patient has done their best to comply with conservative therapy of 20-30 mm Hg compression stockings, leg elevation, exercise, and anti-inflammatories as needed for discomfort.  Despite this, they continue to have daily and persistent symptoms from their venous disease.  A venous reflux study demonstrates bilateral great saphenous vein and small saphenous vein reflux. No DVT or superficial thrombophlebitis was identified.  As such, the patient is likely to benefit from endovenous laser ablation of the great and small saphenous vein bilaterally.  Risks and benefits of the procedure including bleeding, infection, recanalization, DVT, and need for further therapy for residual varicosities were discussed.  The patient voices their understanding and is agreeable to proceed with bilateral great and small saphenous vein laser ablation.     Festus Barren 11/13/2016, 1:19 PM

## 2016-11-16 ENCOUNTER — Ambulatory Visit (INDEPENDENT_AMBULATORY_CARE_PROVIDER_SITE_OTHER): Payer: 59 | Admitting: Vascular Surgery

## 2016-11-16 ENCOUNTER — Encounter (INDEPENDENT_AMBULATORY_CARE_PROVIDER_SITE_OTHER): Payer: 59

## 2016-12-16 ENCOUNTER — Other Ambulatory Visit: Payer: Self-pay | Admitting: Obstetrics and Gynecology

## 2016-12-16 DIAGNOSIS — F419 Anxiety disorder, unspecified: Principal | ICD-10-CM

## 2016-12-16 DIAGNOSIS — F329 Major depressive disorder, single episode, unspecified: Secondary | ICD-10-CM

## 2016-12-17 NOTE — Telephone Encounter (Signed)
RN to find out from pt how she is doing on sertraline. She was supposed to call me back re: sx, side effects. If doing better, will send in RF. Thx.

## 2016-12-17 NOTE — Telephone Encounter (Signed)
Pt states she is still doing about the same but the medication is working fine and dosage is good where it is. Reports no side effects. Requests refills.

## 2016-12-17 NOTE — Telephone Encounter (Signed)
Please advise for refill. Thank you.  

## 2016-12-26 ENCOUNTER — Ambulatory Visit (INDEPENDENT_AMBULATORY_CARE_PROVIDER_SITE_OTHER): Payer: 59 | Admitting: Obstetrics and Gynecology

## 2016-12-26 ENCOUNTER — Encounter: Payer: Self-pay | Admitting: Obstetrics and Gynecology

## 2016-12-26 DIAGNOSIS — F419 Anxiety disorder, unspecified: Secondary | ICD-10-CM | POA: Diagnosis not present

## 2016-12-26 DIAGNOSIS — F329 Major depressive disorder, single episode, unspecified: Secondary | ICD-10-CM | POA: Diagnosis not present

## 2016-12-26 MED ORDER — ALPRAZOLAM 0.25 MG PO TABS: 0.2500 mg | ORAL_TABLET | Freq: Two times a day (BID) | ORAL | 0 refills | Status: DC | PRN

## 2016-12-26 MED ORDER — SERTRALINE HCL 100 MG PO TABS: 100.0000 mg | ORAL_TABLET | Freq: Every day | ORAL | 0 refills | Status: DC

## 2016-12-26 NOTE — Progress Notes (Signed)
Chief Complaint  Patient presents with  . Follow-up    Medication/Bad Headaches (SE?)    HPI:      Monica Long is a 46 y.o. G1P0101 who LMP was Patient's last menstrual period was 12/12/2016., presents today for anxiety/depression f/u from 7/18. She had been on wellbutrin but it wasn't controlling anxiety sx. We stopped wellbutrin and started zoloft and prn xanax. Pt states depression sx are fine but still having anxiety/panic attacks 2-3 times wkly. No side effects of zoloft. Pt takes xanax for panic attacks with relief. Anxiety triggered by work stress and we completed paperwork so pt could have job change, but it wasn't approved. Pt now wants to take 4 wk leave of absence. She is going to try to find new job. No SI  She also notes almost daily tension headaches for the past 3 wks. Sx start as the day goes on at work, less sx when not at work. Pt takes ibup almost daily for sx with relief of pain. Occas exercise.    Past Medical History:  Diagnosis Date  . Anxiety    panic attack  . Depression 02/08/2014  . History of mammogram 01/07/2014; 05-09-15   birad 2; neg  . History of Papanicolaou smear of cervix 01/07/2014   -/-  . Hypercholesteremia   . Hypertriglyceridemia   . Kidney stone 02/2015  . Peripheral vascular disease [facility])     Past Surgical History:  Procedure Laterality Date  . CESAREAN SECTION  05/08/2001   x1    Family History  Problem Relation Age of Onset  . Hypertension Father   . Colon cancer Maternal Grandmother 41    Social History   Social History  . Marital status: Single    Spouse name: N/A  . Number of children: 1  . Years of education: 38   Occupational History  .  Lab [employer]   Social History Main Topics  . Smoking status: Never Smoker  . Smokeless tobacco: Never Used  . Alcohol use No  . Drug use: No  . Sexual activity: Yes    Birth control/ protection: Pill   Other Topics Concern  . Not on file   Social History Narrative  .  No narrative on file     Current Outpatient Prescriptions:  .  ALPRAZolam (XANAX) 0.25 MG tablet, Take 1 tablet (0.25 mg total) by mouth 2 (two) times daily as needed for anxiety., Disp: 30 tablet, Rfl: 0 .  drospirenone-ethinyl estradiol (YAZ) 3-0.02 MG tablet, Take 1 tablet by mouth daily., Disp: 3 Package, Rfl: 3 .  ibuprofen (ADVIL,MOTRIN) 800 MG tablet, Take 800 mg by mouth every 8 (eight) hours as needed., Disp: , Rfl:  .  LORYNA 3-0.02 MG tablet, TAKE 1 TABLET BY MOUTH DAILY. (Patient not taking: Reported on 11/13/2016), Disp: 28 tablet, Rfl: 0 .  sertraline (ZOLOFT) 100 MG tablet, Take 1 tablet (100 mg total) by mouth daily., Disp: 30 tablet, Rfl: 0   ROS:  Review of Systems  Constitutional: Negative for fever.  Gastrointestinal: Negative for blood in stool, constipation, diarrhea, nausea and vomiting.  Genitourinary: Negative for dyspareunia, dysuria, flank pain, frequency, hematuria, urgency, vaginal bleeding, vaginal discharge and vaginal pain.  Musculoskeletal: Negative for back pain.  Skin: Negative for rash.  Psychiatric/Behavioral: Negative for dysphoric mood, self-injury and suicidal ideas. The patient is not nervous/anxious.      OBJECTIVE:   Vitals:  BP 120/80 (BP Location: Left Arm, Patient Position: Sitting, Cuff Size: Normal)  Pulse 88   Ht  (1.702 m)   Wt 152 lb (68.9 kg)   LMP 12/12/2016   BMI 23.81 kg/m   Physical Exam  Constitutional: She is oriented to person, place, and time and well-developed, well-nourished, and in no distress.  Neurological: She is alert and oriented to person, place, and time.  Psychiatric: Memory, affect and judgment normal.  Vitals reviewed.   Assessment/Plan: Anxiety and depression - Sx improved on zoloft but still panic attacks 2-3 times wkly. Increase zoloft to 100 mg. Rx RF. Rx RF xanax prn. Knows to use sparingly. F/u via phone 4 wks - Plan: ALPRAZolam (XANAX) 0.25 MG tablet, sertraline (ZOLOFT) 100 MG  tablet    Meds ordered this encounter  Medications  . ALPRAZolam (XANAX) 0.25 MG tablet    Sig: Take 1 tablet (0.25 mg total) by mouth 2 (two) times daily as needed for anxiety.    Dispense:  30 tablet    Refill:  0  . sertraline (ZOLOFT) 100 MG tablet    Sig: Take 1 tablet (100 mg total) by mouth daily.    Dispense:  30 tablet    Refill:  0      Return if symptoms worsen or fail to improve.  Alicia B. Copland, PA-C 12/26/2016 5:12 PM

## 2017-01-01 ENCOUNTER — Telehealth: Payer: Self-pay

## 2017-01-01 NOTE — Telephone Encounter (Signed)
FMLA/DISABILITY form for [employer] filled out on 12/28/16 and given to [place] today for processing.

## 2017-01-01 NOTE — Telephone Encounter (Signed)
Second FMLA/DISABILITY form for [employer] filled out and given to [facility] to approve then will give to [place] for processing.

## 2017-01-02 ENCOUNTER — Ambulatory Visit (INDEPENDENT_AMBULATORY_CARE_PROVIDER_SITE_OTHER): Payer: 59

## 2017-01-02 ENCOUNTER — Encounter: Payer: Self-pay | Admitting: *Deleted

## 2017-01-02 ENCOUNTER — Ambulatory Visit
Admission: EM | Admit: 2017-01-02 | Discharge: 2017-01-02 | Disposition: A | Discharge status: Home / Self Care | Payer: 59 | Attending: Family Medicine | Admitting: Family Medicine

## 2017-01-02 DIAGNOSIS — M940 Chondrocostal junction syndrome [Tietze]: Secondary | ICD-10-CM | POA: Insufficient documentation

## 2017-01-02 DIAGNOSIS — Z79899 Other long term (current) drug therapy: Secondary | ICD-10-CM | POA: Insufficient documentation

## 2017-01-02 DIAGNOSIS — R002 Palpitations: Secondary | ICD-10-CM | POA: Diagnosis not present

## 2017-01-02 DIAGNOSIS — R079 Chest pain, unspecified: Secondary | ICD-10-CM | POA: Diagnosis not present

## 2017-01-02 DIAGNOSIS — K219 Gastro-esophageal reflux disease without esophagitis: Secondary | ICD-10-CM | POA: Diagnosis not present

## 2017-01-02 DIAGNOSIS — R0789 Other chest pain: Secondary | ICD-10-CM

## 2017-01-02 NOTE — ED Provider Notes (Signed)
[facility]    CSN: [id number] Arrival date & time: 01/02/17  1606     History   Chief Complaint Chief Complaint  Patient presents with  . Chest Pain    HPI Monica Long is a 46 y.o. female.   The history is provided by the patient.  Chest Pain  Pain location:  Substernal area Pain quality: aching   Pain radiates to:  Does not radiate Pain severity:  Mild Onset quality:  Sudden Duration:  4 days Timing:  Intermittent Progression:  Unchanged Chronicity:  New Relieved by:  Nothing Worsened by:  Certain positions and coughing Ineffective treatments:  Antacids Associated symptoms: cough, heartburn, palpitations and shortness of breath   Associated symptoms: no abdominal pain, no AICD problem, no altered mental status, no anorexia, no anxiety, no back pain, no claudication, no diaphoresis, no dizziness, no dysphagia, no fatigue, no fever, no headache, no lower extremity edema, no nausea, no near-syncope, no numbness, no orthopnea, no PND, no syncope, no vomiting and no weakness   Risk factors: no aortic disease, no birth control, no coronary artery disease, no diabetes mellitus, no Ehlers-Danlos syndrome, no high cholesterol, no hypertension, no immobilization, not female, no Marfan's syndrome, not obese, not pregnant, no prior DVT/PE, no smoking and no surgery     Past Medical History:  Diagnosis Date  . Anxiety    panic attack  . Depression 02/08/2014  . History of mammogram 01/07/2014; 05-09-15   birad 2; neg  . History of Papanicolaou smear of cervix 01/07/2014   -/-  . Hypercholesteremia   . Hypertriglyceridemia   . Kidney stone 02/2015  . Peripheral vascular disease [facility])     Patient Active Problem List   Diagnosis Date Noted  . Varicose veins of leg with pain, bilateral 06/19/2016  . Swelling of limb 06/19/2016    Past Surgical History:  Procedure Laterality Date  . CESAREAN SECTION  05/08/2001   x1    OB History    Gravida Para Term  Preterm AB Living   1 1   1   1    SAB TAB Ectopic Multiple Live Births           1       Home Medications    Prior to Admission medications   Medication Sig Start Date End Date Taking? Authorizing Provider  ALPRAZolam (XANAX) 0.25 MG tablet Take 1 tablet (0.25 mg total) by mouth 2 (two) times daily as needed for anxiety. 12/26/16  Yes Copland, Ilona Sorrel, PA-C  drospirenone-ethinyl estradiol (YAZ) 3-0.02 MG tablet Take 1 tablet by mouth daily. 07/10/16  Yes Copland, Ilona Sorrel, PA-C  ibuprofen (ADVIL,MOTRIN) 800 MG tablet Take 800 mg by mouth every 8 (eight) hours as needed.    [provider]  LORYNA 3-0.02 MG tablet TAKE 1 TABLET BY MOUTH DAILY. Patient not taking: Reported on 11/13/2016 08/09/16   Copland, Helmut Muster B, PA-C  sertraline (ZOLOFT) 100 MG tablet Take 1 tablet (100 mg total) by mouth daily. 12/26/16   Copland, Ilona Sorrel, PA-C    Family History Family History  Problem Relation Age of Onset  . Hypertension Father   . Colon cancer Maternal Grandmother 56    Social History Social History  Substance Use Topics  . Smoking status: Never Smoker  . Smokeless tobacco: Never Used  . Alcohol use No     Allergies   Patient has no known allergies.   Review of Systems Review of Systems  Constitutional: Negative for diaphoresis, fatigue  and fever.  HENT: Negative for trouble swallowing.   Respiratory: Positive for cough and shortness of breath.   Cardiovascular: Positive for chest pain and palpitations. Negative for orthopnea, claudication, syncope, PND and near-syncope.  Gastrointestinal: Positive for heartburn. Negative for abdominal pain, anorexia, nausea and vomiting.  Musculoskeletal: Negative for back pain.  Neurological: Negative for dizziness, weakness, numbness and headaches.     Physical Exam Triage Vital Signs ED Triage Vitals  Enc Vitals Group     BP 01/02/17 1614 127/77     Pulse Rate 01/02/17 1614 79     Resp 01/02/17 1614 16     Temp 01/02/17  1614 98.7 F (37.1 C)     Temp Source 01/02/17 1614 Oral     SpO2 01/02/17 1614 98 %     Weight --      Height --      Head Circumference --      Peak Flow --      Pain Score 01/02/17 1616 8     Pain Loc --      Pain Edu? --      Excl. in GC? --    No data found.   Updated Vital Signs BP 127/77 (BP Location: Left Arm)   Pulse 79   Temp 98.7 F (37.1 C) (Oral)   Resp 16   LMP 12/18/2016   SpO2 98%   Visual Acuity Right Eye Distance:   Left Eye Distance:   Bilateral Distance:    Right Eye Near:   Left Eye Near:    Bilateral Near:     Physical Exam  Constitutional: She appears well-developed and well-nourished. No distress.  HENT:  Head: Normocephalic and atraumatic.  Right Ear: Tympanic membrane, external ear and ear canal normal.  Left Ear: Tympanic membrane, external ear and ear canal normal.  Nose: No mucosal edema, rhinorrhea, nose lacerations, sinus tenderness, nasal deformity, septal deviation or nasal septal hematoma. No epistaxis.  No foreign bodies. Right sinus exhibits no maxillary sinus tenderness and no frontal sinus tenderness. Left sinus exhibits no maxillary sinus tenderness and no frontal sinus tenderness.  Mouth/Throat: Uvula is midline, oropharynx is clear and moist and mucous membranes are normal. No oropharyngeal exudate.  Eyes: Pupils are equal, round, and reactive to light. Conjunctivae and EOM are normal. Right eye exhibits no discharge. Left eye exhibits no discharge. No scleral icterus.  Neck: Normal range of motion. Neck supple. No thyromegaly present.  Cardiovascular: Normal rate, regular rhythm and normal heart sounds.   Pulmonary/Chest: Effort normal and breath sounds normal. No respiratory distress. She has no wheezes. She has no rales. She exhibits tenderness (reproducible mid and lower sternum).  Abdominal: Soft. Bowel sounds are normal. She exhibits no distension and no mass. There is no tenderness. There is no rebound and no guarding. No  hernia.  Lymphadenopathy:    She has no cervical adenopathy.  Skin: She is not diaphoretic.  Nursing note and vitals reviewed.    UC Treatments / Results  Labs (all labs ordered are listed, but only abnormal results are displayed) Labs Reviewed - No data to display  EKG  EKG Interpretation None       Radiology Dg Chest 2 View  Result Date: 01/02/2017 CLINICAL DATA:  Chest pain EXAM: CHEST  2 VIEW COMPARISON:  None. FINDINGS: Lungs are clear. Heart size and pulmonary vascularity are normal. No adenopathy. No pneumothorax. There is midthoracic dextroscoliosis. IMPRESSION: No edema or consolidation. Electronically Signed   By: Bretta Bang  III M.D.   On: 01/02/2017 17:05    Procedures .EKG Date/Time: 01/02/2017 7:35 PM Performed by: Payton MccallumONTY, Kema Santaella Authorized by: Payton MccallumONTY, Kandise Riehle   ECG reviewed by ED Physician in the absence of a cardiologist: yes   Previous ECG:    Previous ECG:  Unavailable Interpretation:    Interpretation: normal   Rate:    ECG rate assessment: normal   Rhythm:    Rhythm: sinus rhythm   Ectopy:    Ectopy: none   QRS:    QRS axis:  Normal   QRS intervals:  Normal Conduction:    Conduction: normal   ST segments:    ST segments:  Normal T waves:    T waves: normal     (including critical care time)  Medications Ordered in UC Medications - No data to display   Initial Impression / Assessment and Plan / UC Course  I have reviewed the triage vital signs and the nursing notes.  Pertinent labs & imaging results that were available during my care of the patient were reviewed by me and considered in my medical decision making (see chart for details).       Final Clinical Impressions(s) / UC Diagnoses   Final diagnoses:  Costochondritis  Gastroesophageal reflux disease, esophagitis presence not specified    New Prescriptions Discharge Medication List as of 01/02/2017  5:17 PM     1. ekg/x-ray results and diagnosis reviewed with  patient 2. rx as per orders above; reviewed possible side effects, interactions, risks and benefits  3. Recommend supportive treatment with otc analgesics, otc omeprazole 4. Follow-up prn if symptoms worsen or don't improve  Controlled Substance Prescriptions [place] Controlled Substance Registry consulted? Not Applicable   Payton Mccallumonty, Kay Ricciuti, MD 01/02/17 [phone]

## 2017-01-02 NOTE — ED Triage Notes (Signed)
Patient started having center chest pain / pressure 2 days ago. Patient reports occasional skip in her heart beat and SOB. No previous cardiac history.

## 2017-01-02 NOTE — Discharge Instructions (Signed)
Over the counter tylenol Over the counter omeprazole 20mg  tab one daily

## 2017-01-07 ENCOUNTER — Telehealth: Payer: Self-pay

## 2017-01-07 NOTE — Telephone Encounter (Signed)
Pt aware.

## 2017-01-07 NOTE — Telephone Encounter (Signed)
Pt was told to call if problems.  [phone].  Pt states her panic attacks and headaches are worse.  Went to Urgent Care for chest pain and dx'd c costochondritis d/t stress.  Adv would let ABC know.

## 2017-01-07 NOTE — Telephone Encounter (Signed)
Pt needs to find PCP to manage anxiety/panic attacks since we have tried several meds without success. Cont 100 mg zoloft use since will take her a few wks to get in with someone.

## 2017-01-08 ENCOUNTER — Telehealth: Payer: Self-pay

## 2017-01-08 NOTE — Telephone Encounter (Signed)
Pt states paper work from [employer] [facility]. Pt inquiring if it has been received. If so, she needs it faxed back ASAP. Cb#[phone]

## 2017-01-14 NOTE — Telephone Encounter (Signed)
[facility]

## 2017-01-14 NOTE — Telephone Encounter (Signed)
Form [facility] in case [facility] calls back. If she doesn't call back tomorrow, I would just go ahead and fax it at the end of the day. Thx.

## 2017-01-17 NOTE — Telephone Encounter (Signed)
FMLA form given to [place] for processing.

## 2017-02-01 ENCOUNTER — Other Ambulatory Visit: Payer: Self-pay | Admitting: Obstetrics and Gynecology

## 2017-02-01 DIAGNOSIS — F419 Anxiety disorder, unspecified: Principal | ICD-10-CM

## 2017-02-01 DIAGNOSIS — F329 Major depressive disorder, single episode, unspecified: Secondary | ICD-10-CM

## 2017-06-10 ENCOUNTER — Ambulatory Visit (INDEPENDENT_AMBULATORY_CARE_PROVIDER_SITE_OTHER): Payer: 59 | Admitting: Vascular Surgery

## 2017-07-14 ENCOUNTER — Other Ambulatory Visit: Payer: Self-pay | Admitting: Obstetrics and Gynecology

## 2017-08-16 ENCOUNTER — Encounter: Payer: Self-pay | Admitting: Physician Assistant

## 2017-08-16 ENCOUNTER — Ambulatory Visit (INDEPENDENT_AMBULATORY_CARE_PROVIDER_SITE_OTHER): Payer: 59 | Admitting: Physician Assistant

## 2017-08-16 VITALS — BP 114/72 | HR 76 | Temp 98.6°F | Resp 16 | Ht 67.0 in | Wt 152.0 lb

## 2017-08-16 DIAGNOSIS — I83893 Varicose veins of bilateral lower extremities with other complications: Secondary | ICD-10-CM | POA: Diagnosis not present

## 2017-08-16 DIAGNOSIS — Z13220 Encounter for screening for lipoid disorders: Secondary | ICD-10-CM

## 2017-08-16 NOTE — Patient Instructions (Addendum)
Varicose Veins Varicose veins are veins that have become enlarged and twisted. They are usually seen in the legs but can occur in other parts of the body as well. What are the causes? This condition is the result of valves in the veins not working properly. Valves in the veins help to return blood from the leg to the heart. If these valves are damaged, blood flows backward and backs up into the veins in the leg near the skin. This causes the veins to become larger. What increases the risk? People who are on their feet a lot, who are pregnant, or who are overweight are more likely to develop varicose veins. What are the signs or symptoms?  Bulging, twisted-appearing, bluish veins, most commonly found on the legs.  Leg pain or a feeling of heaviness. These symptoms may be worse at the end of the day.  Leg swelling.  Changes in skin color. How is this diagnosed? A health care provider can usually diagnose varicose veins by examining your legs. Your health care provider may also recommend an ultrasound of your leg veins. How is this treated? Most varicose veins can be treated at home.However, other treatments are available for people who have persistent symptoms or want to improve the cosmetic appearance of the varicose veins. These treatment options include:  Sclerotherapy. A solution is injected into the vein to close it off.  Laser treatment. A laser is used to heat the vein to close it off.  Radiofrequency vein ablation. An electrical current produced by radio waves is used to close off the vein.  Phlebectomy. The vein is surgically removed through small incisions made over the varicose vein.  Vein ligation and stripping. The vein is surgically removed through incisions made over the varicose vein after the vein has been tied (ligated).  Follow these instructions at home:  Do not stand or sit in one position for long periods of time. Do not sit with your legs crossed. Rest with your  legs raised during the day.  Wear compression stockings as directed by your health care provider. These stockings help to prevent blood clots and reduce swelling in your legs.  Do not wear other tight, encircling garments around your legs, pelvis, or waist.  Walk as much as possible to increase blood flow.  Raise the foot of your bed at night with 2-inch blocks.  If you get a cut in the skin over the vein and the vein bleeds, lie down with your leg raised and press on it with a clean cloth until the bleeding stops. Then place a bandage (dressing) on the cut. See your health care provider if it continues to bleed. Contact a health care provider if:  The skin around your ankle starts to break down.  You have pain, redness, tenderness, or hard swelling in your leg over a vein.  You are uncomfortable because of leg pain. This information is not intended to replace advice given to you by your health care provider. Make sure you discuss any questions you have with your health care provider. Document Released: 12/13/2004 Document Revised: 08/11/2015 Document Reviewed: 09/06/2015 Elsevier Interactive Patient Education  2017 [employer] j

## 2017-08-16 NOTE — Progress Notes (Signed)
Patient: Monica Long Female    DOB: 01/25/1971   47 y.o.   MRN: [id number] Visit Date: 08/21/2017  Today's Provider: Trey Sailors, PA-C   Chief Complaint  Patient presents with  . Establish Care  . Edema    Bilateral feet and legs.    Subjective:    HPI   Monica Long is a 47 y/o woman presenting today to establish care. She is living in [place] with her parents and her only child, a 36 year old daughter. 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 Previously seen at [facility].   She has been struggling with varicose veins for years. She reports varicose veins on both lower extremities that itch and are painful. She also reports lower extremity edema. She has worn compression stockings which help some with edema but not with painful symptoms. Venous reflux study shows bilateral great saphenous vein and small saphenous vein reflux. She was recommended for endovenous laser ablation surgery but this was apparently deemed cosmetic by her insurance. She is wondering what she can do about her symptoms.  Takes zoloft 100 mg daily, PRN xanax for anxiety, prescribed OBGYN. UTD on PAP. Currently taking Yaz OCP.      Edema: Patient complains of edema. The location of the edema is lower leg(s) bilateral.  The edema has been moderate.  Onset of symptoms was 1 year ago, gradually worsening since that time. The edema is present in the evening. The patient states the problem is long-standing.  The swelling has been aggravated by nothing, relieved by support stockings, elevation of involved area, and been associated with nothing. Cardiac risk factors include none.  No Known Allergies   Current Outpatient Medications:  .  drospirenone-ethinyl estradiol (YAZ) 3-0.02 MG tablet, Take 1 tablet by mouth daily., Disp: 3 Package, Rfl: 3 .  ALPRAZolam (XANAX) 0.25 MG tablet, Take 1 tablet (0.25 mg total) by mouth 2 (two) times daily as needed for anxiety., Disp: 30  tablet, Rfl: 0 .  hydrochlorothiazide (MICROZIDE) 12.5 MG capsule, Take 1 capsule (12.5 mg total) by mouth daily., Disp: 30 capsule, Rfl: 0 .  ibuprofen (ADVIL,MOTRIN) 800 MG tablet, Take 800 mg by mouth every 8 (eight) hours as needed., Disp: , Rfl:  .  sertraline (ZOLOFT) 100 MG tablet, Take 1 tablet (100 mg total) by mouth daily., Disp: 30 tablet, Rfl: 0  Review of Systems  Constitutional: Positive for fatigue. Negative for activity change, appetite change, chills, diaphoresis and unexpected weight change.  HENT: Negative.   Eyes: Negative.   Respiratory: Negative.   Cardiovascular: Positive for leg swelling. Negative for chest pain and palpitations.  Gastrointestinal: Negative.   Endocrine: Negative.   Genitourinary: Negative.   Musculoskeletal: Negative.   Skin: Negative.   Allergic/Immunologic: Negative.   Neurological: Negative.   Hematological: Negative.   Psychiatric/Behavioral: Negative.     Social History   Tobacco Use  . Smoking status: Never Smoker  . Smokeless tobacco: Never Used  Substance Use Topics  . Alcohol use: No    Alcohol/week: 0.0 oz   Objective:   BP 114/72 (BP Location: Right Arm, Patient Position: Sitting, Cuff Size: Normal)   Pulse 76   Temp 98.6 F (37 C) (Oral)   Resp 16   Ht 5\' 7"  (1.702 m)   Wt 152 lb (68.9 kg)   LMP 07/17/2017   BMI 23.81 kg/m  Vitals:   08/16/17 1521  BP: 114/72  Pulse: 76  Resp: 16  Temp: 98.6 F (37 C)  TempSrc: Oral  Weight: 152 lb (68.9 kg)  Height: 5\' 7"  (1.702 m)     Physical Exam  Constitutional: She appears well-developed and well-nourished.  Cardiovascular: Normal rate and regular rhythm.  Pulmonary/Chest: Effort normal and breath sounds normal.  Abdominal: Soft. Bowel sounds are normal.  Musculoskeletal: She exhibits edema.  Mild 1+ nonpitting edema of both lower extremities. Varicose veins visible bilateral lower extremities.   Skin: Skin is warm and dry.        Assessment & Plan:     1.  Lipid screening  - Lipid Profile - TSH - HgB A1c - Comprehensive Metabolic Panel (CMET) - HIV antibody (with reflex) - CBC with Differential  2. Varicose veins of both legs with edema  Needs to see vascular again and try to resubmit procedure or file appeal. Will try HCTZ in mean time for LE edema but cautioned that this may not be very effective for painful symptoms. See her back 1 mo.  Return in about 1 month (around 09/15/2017) for HCTZ.  The entirety of the information documented in the History of Present Illness, Review of Systems and Physical Exam were personally obtained by me. Portions of this information were initially documented by Kavin LeechLaura Walsh, CMA and reviewed by me for thoroughness and accuracy.          Trey SailorsAdriana M Amberli Ruegg, PA-C  [facility]

## 2017-08-17 LAB — HEMOGLOBIN A1C
Est. average glucose Bld gHb Est-mCnc: 97 mg/dL
Hgb A1c MFr Bld: 5 % (ref 4.8–5.6)

## 2017-08-17 LAB — COMPREHENSIVE METABOLIC PANEL
ALT: 32 IU/L (ref 0–32)
AST: 27 IU/L (ref 0–40)
Albumin/Globulin Ratio: 1.5 (ref 1.2–2.2)
Albumin: 4.5 g/dL (ref 3.5–5.5)
Alkaline Phosphatase: 65 IU/L (ref 39–117)
BUN/Creatinine Ratio: 22 (ref 9–23)
BUN: 16 mg/dL (ref 6–24)
Bilirubin Total: 0.3 mg/dL (ref 0.0–1.2)
CO2: 26 mmol/L (ref 20–29)
Calcium: 10.3 mg/dL — ABNORMAL HIGH (ref 8.7–10.2)
Chloride: 98 mmol/L (ref 96–106)
Creatinine, Ser: 0.74 mg/dL (ref 0.57–1.00)
GFR calc Af Amer: 112 mL/min/{1.73_m2} (ref >59)
GFR calc non Af Amer: 97 mL/min/{1.73_m2} (ref >59)
Globulin, Total: 3 g/dL (ref 1.5–4.5)
Glucose: 83 mg/dL (ref 65–99)
Potassium: 4.4 mmol/L (ref 3.5–5.2)
Sodium: 140 mmol/L (ref 134–144)
Total Protein: 7.5 g/dL (ref 6.0–8.5)

## 2017-08-17 LAB — LIPID PANEL
Chol/HDL Ratio: 5.8 ratio — ABNORMAL HIGH (ref 0.0–4.4)
Cholesterol, Total: 232 mg/dL — ABNORMAL HIGH (ref 100–199)
HDL: 40 mg/dL (ref >39)
LDL Calculated: 115 mg/dL — ABNORMAL HIGH (ref 0–99)
Triglycerides: 387 mg/dL — ABNORMAL HIGH (ref 0–149)
VLDL Cholesterol Cal: 77 mg/dL — ABNORMAL HIGH (ref 5–40)

## 2017-08-17 LAB — CBC WITH DIFFERENTIAL/PLATELET
Basophils Absolute: 0.1 10*3/uL (ref 0.0–0.2)
Basos: 1 %
EOS (ABSOLUTE): 0.3 10*3/uL (ref 0.0–0.4)
Eos: 3 %
Hematocrit: 37.8 % (ref 34.0–46.6)
Hemoglobin: 13.2 g/dL (ref 11.1–15.9)
Immature Grans (Abs): 0 10*3/uL (ref 0.0–0.1)
Immature Granulocytes: 0 %
Lymphocytes Absolute: 2.9 10*3/uL (ref 0.7–3.1)
Lymphs: 31 %
MCH: 28.9 pg (ref 26.6–33.0)
MCHC: 34.9 g/dL (ref 31.5–35.7)
MCV: 83 fL (ref 79–97)
Monocytes Absolute: 0.6 10*3/uL (ref 0.1–0.9)
Monocytes: 7 %
Neutrophils Absolute: 5.4 10*3/uL (ref 1.4–7.0)
Neutrophils: 58 %
Platelets: 291 10*3/uL (ref 150–450)
RBC: 4.56 x10E6/uL (ref 3.77–5.28)
RDW: 14.4 % (ref 12.3–15.4)
WBC: 9.2 10*3/uL (ref 3.4–10.8)

## 2017-08-17 LAB — TSH: TSH: 3.15 u[IU]/mL (ref 0.450–4.500)

## 2017-08-17 LAB — HIV ANTIBODY (ROUTINE TESTING W REFLEX): HIV Screen 4th Generation wRfx: NONREACTIVE

## 2017-08-19 ENCOUNTER — Telehealth: Payer: Self-pay

## 2017-08-19 ENCOUNTER — Other Ambulatory Visit: Payer: Self-pay | Admitting: Physician Assistant

## 2017-08-19 DIAGNOSIS — M7989 Other specified soft tissue disorders: Secondary | ICD-10-CM

## 2017-08-19 MED ORDER — HYDROCHLOROTHIAZIDE 12.5 MG PO CAPS: 12.5000 mg | ORAL_CAPSULE | Freq: Every day | ORAL | 0 refills | Status: DC

## 2017-08-19 NOTE — Progress Notes (Signed)
Sent HCTZ to [facility].

## 2017-08-19 NOTE — Telephone Encounter (Signed)
-----   Message from Trey SailorsAdriana M Long, [place]PA-C sent at 08/19/2017  3:10 PM EDT ----- Cholesterol elevated but not requiring treatment. TSH normal. A1C shows no diabetes. CMET normal. HIV negative. CBC normal. Can send in 12.5 mg HCTZ for lower extremity edema and see her back in one month for follow up.

## 2017-08-19 NOTE — Telephone Encounter (Signed)
LMTCB 08/19/2017  Thanks,   -Nikeya Maxim  

## 2017-08-19 NOTE — Telephone Encounter (Signed)
Pt advised; [facility].  Thanks,   -Vernona RiegerLaura

## 2017-09-10 ENCOUNTER — Other Ambulatory Visit: Payer: Self-pay | Admitting: Physician Assistant

## 2017-09-10 DIAGNOSIS — M7989 Other specified soft tissue disorders: Secondary | ICD-10-CM

## 2017-10-01 ENCOUNTER — Ambulatory Visit (INDEPENDENT_AMBULATORY_CARE_PROVIDER_SITE_OTHER): Payer: 59 | Admitting: Physician Assistant

## 2017-10-01 ENCOUNTER — Encounter: Payer: Self-pay | Admitting: Physician Assistant

## 2017-10-01 VITALS — BP 110/80 | HR 71 | Temp 98.2°F | Resp 16 | Wt 147.4 lb

## 2017-10-01 DIAGNOSIS — Z79899 Other long term (current) drug therapy: Secondary | ICD-10-CM | POA: Diagnosis not present

## 2017-10-01 DIAGNOSIS — M7989 Other specified soft tissue disorders: Secondary | ICD-10-CM

## 2017-10-01 DIAGNOSIS — I83813 Varicose veins of bilateral lower extremities with pain: Secondary | ICD-10-CM

## 2017-10-01 DIAGNOSIS — H9202 Otalgia, left ear: Secondary | ICD-10-CM

## 2017-10-01 MED ORDER — HYDROCHLOROTHIAZIDE 12.5 MG PO CAPS: 12.5000 mg | ORAL_CAPSULE | Freq: Every day | ORAL | 0 refills | Status: DC

## 2017-10-01 NOTE — Progress Notes (Signed)
Patient: Monica Long Female    DOB: 07/31/1970   47 y.o.   MRN: [id number] Visit Date: 10/03/2017  Today's Provider: Trey Sailors, PA-C   Chief Complaint  Patient presents with  . Follow-up    Varicose veins of both legs with edema.   Subjective:    HPI Varicose veins of both legs with edema: Last office visit HCTZ was prescribed for LE edema. Patient reports that the HCTZ is helping.Patient reports feeling better on the medication. Reports less swelling   Ear Pain: Patient presents with left ear pain.  Symptoms include headache. Symptoms began 1 week ago and are gradually worsening since that time. Patient denies chills, fever, nasal congestion, productive cough, sneezing, sore throat and wheezing. She reports pain with chewing but denies headache and eye pain. She does chew [place] kids frequently on that side.        No Known Allergies   Current Outpatient Medications:  .  drospirenone-ethinyl estradiol (YAZ) 3-0.02 MG tablet, Take 1 tablet by mouth daily., Disp: 3 Package, Rfl: 3 .  hydrochlorothiazide (MICROZIDE) 12.5 MG capsule, Take 1 capsule (12.5 mg total) by mouth daily., Disp: 90 capsule, Rfl: 0 .  ALPRAZolam (XANAX) 0.25 MG tablet, Take 1 tablet (0.25 mg total) by mouth 2 (two) times daily as needed for anxiety. (Patient not taking: Reported on 10/01/2017), Disp: 30 tablet, Rfl: 0 .  sertraline (ZOLOFT) 100 MG tablet, Take 1 tablet (100 mg total) by mouth daily. (Patient not taking: Reported on 10/01/2017), Disp: 30 tablet, Rfl: 0  Review of Systems  HENT: Positive for ear pain.   Cardiovascular: Negative for chest pain and palpitations.  Neurological: Positive for headaches.    Social History   Tobacco Use  . Smoking status: Never Smoker  . Smokeless tobacco: Never Used  Substance Use Topics  . Alcohol use: No    Alcohol/week: 0.0 oz   Objective:   BP 110/80 (BP Location: Right Arm, Patient Position: Sitting, Cuff Size: Normal)   Pulse 71    Temp 98.2 F (36.8 C) (Oral)   Resp 16   Wt 147 lb 6.4 oz (66.9 kg)   LMP 08/05/2017 (Approximate)   SpO2 99%   BMI 23.09 kg/m  Vitals:   10/01/17 1621  BP: 110/80  Pulse: 71  Resp: 16  Temp: 98.2 F (36.8 C)  TempSrc: Oral  SpO2: 99%  Weight: 147 lb 6.4 oz (66.9 kg)     Physical Exam  Constitutional: She is oriented to person, place, and time.  HENT:  Right Ear: External ear normal.  Left Ear: External ear normal.  Mouth/Throat: Oropharynx is clear and moist. No oropharyngeal exudate.  Some pain along her left TMJ  Neck: Neck supple.  Musculoskeletal:  Bilateral lower extremity edema improved.   Lymphadenopathy:    She has no cervical adenopathy.  Neurological: She is alert and oriented to person, place, and time.  Skin: Skin is warm and dry.  Psychiatric: She has a normal mood and affect. Her behavior is normal.        Assessment & Plan:     1. Varicose veins of leg with pain, bilateral   2. Long term current use of diuretic  - Comprehensive Metabolic Panel (CMET)  3. Swelling of limb  Improved, patient wishes to continue HCTZ. Check lack to make sure kidney function and electrolytes are stable.  - hydrochlorothiazide (MICROZIDE) 12.5 MG capsule; Take 1 capsule (12.5 mg total) by mouth daily.  Dispense: 90 capsule; Refill: 0  4. Left ear pain  Suspect this is some TMJ dysfunction from chewing gummies frequently. Advised to switch sides when chewing, anti-inflammatories, etc.  Return in about 1 month (around 11/01/2017) for CPE .  The entirety of the information documented in the History of Present Illness, Review of Systems and Physical Exam were personally obtained by me. Portions of this information were initially documented by Sheliah HatchKathleen Wolford, CMA and reviewed by me for thoroughness and accuracy.          Trey SailorsAdriana M Therman Hughlett, PA-C  [facility]

## 2017-10-01 NOTE — Patient Instructions (Signed)
Varicose Veins Varicose veins are veins that have become enlarged and twisted. They are usually seen in the legs but can occur in other parts of the body as well. What are the causes? This condition is the result of valves in the veins not working properly. Valves in the veins help to return blood from the leg to the heart. If these valves are damaged, blood flows backward and backs up into the veins in the leg near the skin. This causes the veins to become larger. What increases the risk? People who are on their feet a lot, who are pregnant, or who are overweight are more likely to develop varicose veins. What are the signs or symptoms?  Bulging, twisted-appearing, bluish veins, most commonly found on the legs.  Leg pain or a feeling of heaviness. These symptoms may be worse at the end of the day.  Leg swelling.  Changes in skin color. How is this diagnosed? A health care provider can usually diagnose varicose veins by examining your legs. Your health care provider may also recommend an ultrasound of your leg veins. How is this treated? Most varicose veins can be treated at home.However, other treatments are available for people who have persistent symptoms or want to improve the cosmetic appearance of the varicose veins. These treatment options include:  Sclerotherapy. A solution is injected into the vein to close it off.  Laser treatment. A laser is used to heat the vein to close it off.  Radiofrequency vein ablation. An electrical current produced by radio waves is used to close off the vein.  Phlebectomy. The vein is surgically removed through small incisions made over the varicose vein.  Vein ligation and stripping. The vein is surgically removed through incisions made over the varicose vein after the vein has been tied (ligated). Follow these instructions at home:   Do not stand or sit in one position for long periods of time. Do not sit with your legs crossed. Rest with your  legs raised during the day.  Wear compression stockings as directed by your health care provider. These stockings help to prevent blood clots and reduce swelling in your legs.  Do not wear other tight, encircling garments around your legs, pelvis, or waist.  Walk as much as possible to increase blood flow.  Raise the foot of your bed at night with 2-inch blocks.  If you get a cut in the skin over the vein and the vein bleeds, lie down with your leg raised and press on it with a clean cloth until the bleeding stops. Then place a bandage (dressing) on the cut. See your health care provider if it continues to bleed. Contact a health care provider if:  The skin around your ankle starts to break down.  You have pain, redness, tenderness, or hard swelling in your leg over a vein.  You are uncomfortable because of leg pain. This information is not intended to replace advice given to you by your health care provider. Make sure you discuss any questions you have with your health care provider. Document Released: 12/13/2004 Document Revised: 08/11/2015 Document Reviewed: 09/06/2015 Elsevier Interactive Patient Education  2017 Elsevier Inc.  

## 2017-11-01 ENCOUNTER — Encounter: Payer: Self-pay | Admitting: Physician Assistant

## 2017-11-01 ENCOUNTER — Ambulatory Visit (INDEPENDENT_AMBULATORY_CARE_PROVIDER_SITE_OTHER): Payer: 59 | Admitting: Physician Assistant

## 2017-11-01 VITALS — BP 102/80 | HR 81 | Resp 16 | Wt 145.8 lb

## 2017-11-01 DIAGNOSIS — R51 Headache: Secondary | ICD-10-CM | POA: Diagnosis not present

## 2017-11-01 DIAGNOSIS — N912 Amenorrhea, unspecified: Secondary | ICD-10-CM

## 2017-11-01 DIAGNOSIS — R519 Headache, unspecified: Secondary | ICD-10-CM

## 2017-11-01 DIAGNOSIS — Z0000 Encounter for general adult medical examination without abnormal findings: Secondary | ICD-10-CM

## 2017-11-01 DIAGNOSIS — M7989 Other specified soft tissue disorders: Secondary | ICD-10-CM | POA: Diagnosis not present

## 2017-11-01 MED ORDER — DROSPIRENONE-ETHINYL ESTRADIOL 3-0.02 MG PO TABS: 1.0000 | ORAL_TABLET | Freq: Every day | ORAL | 3 refills | Status: DC

## 2017-11-01 MED ORDER — HYDROCHLOROTHIAZIDE 12.5 MG PO CAPS: 12.5000 mg | ORAL_CAPSULE | Freq: Every day | ORAL | 1 refills | Status: DC

## 2017-11-01 NOTE — Progress Notes (Deleted)
Patient: Monica Long, Female    DOB: Mar 14, 1971, 47 y.o.   MRN: [id number] Visit Date: 11/01/2017  Today's Provider: Trey SailorsAdriana M Pollak, PA-C   Chief Complaint  Patient presents with  . Annual Exam   Subjective:    Annual physical exam Monica Long is a 47 y.o. female who presents today for health maintenance and complete physical. She feels well. She reports exercising yes/walking. She reports she is sleeping well.  Family history of colon cancer in grandmother at age 46. No other affected family members. No family history of breast cancer. PAP smear 07/10/2016 normal and HPV negative. Last mammogram 2017.  She is currently taking Yaz. She reports not having periods for 3 months. She has some bloating, cramping, but denies vasomotor symptoms. She wonders if she is going through menopause. Her mother went through men  ---------------------------------------------------------------   Review of Systems  Constitutional: Positive for fatigue.  Neurological: Positive for headaches.  All other systems reviewed and are negative.   Social History      She  reports that she has never smoked. She has never used smokeless tobacco. She reports that she does not drink alcohol or use drugs.       Social History   Socioeconomic History  . Marital status: Single    Spouse name: Not on file  . Number of children: 1  . Years of education: 5312  . Highest education level: Not on file  Occupational History    Employer: [employer]  Social Needs  . Financial resource strain: Not on file  . Food insecurity:    Worry: Not on file    Inability: Not on file  . Transportation needs:    Medical: Not on file    Non-medical: Not on file  Tobacco Use  . Smoking status: Never Smoker  . Smokeless tobacco: Never Used  Substance and Sexual Activity  . Alcohol use: No    Alcohol/week: 0.0 standard drinks  . Drug use: No  . Sexual activity: Yes    Birth control/protection: Pill    Lifestyle  . Physical activity:    Days per week: Not on file    Minutes per session: Not on file  . Stress: Not on file  Relationships  . Social connections:    Talks on phone: Not on file    Gets together: Not on file    Attends religious service: Not on file    Active member of club or organization: Not on file    Attends meetings of clubs or organizations: Not on file    Relationship status: Not on file  Other Topics Concern  . Not on file  Social History Narrative  . Not on file    Past Medical History:  Diagnosis Date  . Anxiety    panic attack  . Depression 02/08/2014  . History of mammogram 01/07/2014; 05-09-15   birad 2; neg  . History of Papanicolaou smear of cervix 01/07/2014   -/-  . Hypercholesteremia   . Hypertriglyceridemia   . Kidney stone 02/2015  . Peripheral vascular disease [facility])      Patient Active Problem List   Diagnosis Date Noted  . Varicose veins of leg with pain, bilateral 06/19/2016  . Swelling of limb 06/19/2016    Past Surgical History:  Procedure Laterality Date  . CESAREAN SECTION  05/08/2001   x1    Family History        Family Status  Relation Name Status  . Mother  Alive  . Father  Alive  . MGM  Alive        Her family history includes Colon cancer (age of onset: 7054) in her maternal grandmother; Healthy in her mother; Hypertension in her father.      No Known Allergies   Current Outpatient Medications:  .  drospirenone-ethinyl estradiol (YAZ) 3-0.02 MG tablet, Take 1 tablet by mouth daily., Disp: 3 Package, Rfl: 3 .  hydrochlorothiazide (MICROZIDE) 12.5 MG capsule, Take 1 capsule (12.5 mg total) by mouth daily., Disp: 90 capsule, Rfl: 0 .  ALPRAZolam (XANAX) 0.25 MG tablet, Take 1 tablet (0.25 mg total) by mouth 2 (two) times daily as needed for anxiety. (Patient not taking: Reported on 10/01/2017), Disp: 30 tablet, Rfl: 0 .  sertraline (ZOLOFT) 100 MG tablet, Take 1 tablet (100 mg total) by mouth daily. (Patient not  taking: Reported on 10/01/2017), Disp: 30 tablet, Rfl: 0   Patient Care Team: Maryella ShiversPollak, Adriana M, PA-C as PCP - General (Physician Assistant)      Objective:   Vitals: BP 102/80 (BP Location: Right Arm, Patient Position: Sitting, Cuff Size: Normal)   Pulse 81   Resp 16   Wt 145 lb 12.8 oz (66.1 kg)   LMP 07/17/2017   SpO2 99%   BMI 22.84 kg/m    Vitals:   11/01/17 1020  BP: 102/80  Pulse: 81  Resp: 16  SpO2: 99%  Weight: 145 lb 12.8 oz (66.1 kg)     Physical Exam   Depression Screen PHQ 2/9 Scores 11/01/2017 08/19/2017  PHQ - 2 Score 0 0  PHQ- 9 Score 2 3      Assessment & Plan:     Routine Health Maintenance and Physical Exam  Exercise Activities and Dietary recommendations Goals   None     Immunization History  Administered Date(s) Administered  . Tdap 01/28/2008    Health Maintenance  Topic Date Due  . INFLUENZA VACCINE  10/17/2017  . TETANUS/TDAP  01/27/2018  . PAP SMEAR  07/11/2019  . HIV Screening  Completed     Discussed health benefits of physical activity, and encouraged her to engage in regular exercise appropriate for her age and condition.    1. Annual physical exam   2. Swelling of limb  Continue, doing well.  - hydrochlorothiazide (MICROZIDE) 12.5 MG capsule; Take 1 capsule (12.5 mg total) by mouth daily.  Dispense: 90 capsule; Refill: 1 - drospirenone-ethinyl estradiol (YAZ) 3-0.02 MG tablet; Take 1 tablet by mouth daily.  Dispense: 3 Package; Refill: 3  3. Chronic nonintractable headache, unspecified headache type  Wonders if amenorrhea is menopause, she is not sexually active x several years. May be light menses due to OCP, suggest trial off OCP to see if menses return.  - drospirenone-ethinyl estradiol (YAZ) 3-0.02 MG tablet; Take 1 tablet by mouth daily.  Dispense: 3 Package; Refill: 3  4. Amenorrhea  See #3.   Return in about 6 months (around 05/04/2018).  The entirety of the information documented in the History of  Present Illness, Review of Systems and Physical Exam were personally obtained by me. Portions of this information were initially documented by Kavin LeechLaura Walsh, CMA and reviewed by me for thoroughness and accuracy.    --------------------------------------------------------------------    Trey SailorsAdriana M Pollak, PA-C  [facility]

## 2018-01-06 NOTE — Progress Notes (Signed)
Subjective:    Patient ID: Monica Long, female    DOB: [birth year], 47 y.o.   MRN: [id number]  Monica Long is a 47 y.o. female presenting on 11/01/2017 for Annual Exam   HPI   PAP: 07/10/2016 normal, HPV negative Mammogram: no family history, not indicated  Lower Extremity Edema: Has varicosities. Doing well on HCTZ, feels this has improved her symptoms of lower extremity cramping.  Amenorrhea: She is currently taking Yaz and has been for many years. Hasn't had menstrual cycle in several months. She has not been sexually active in some years. Continued taking Yaz for menstrual migraines.   Anxiety/Depression: Doing well on zoloft 100 mg, takes this daily without issues.    Past Medical History:  Diagnosis Date  . Anxiety    panic attack  . Depression 02/08/2014  . History of mammogram 01/07/2014; 05-09-15   birad 2; neg  . History of Papanicolaou smear of cervix 01/07/2014   -/-  . Hypercholesteremia   . Hypertriglyceridemia   . Kidney stone 02/2015  . Peripheral vascular disease [facility])    Past Surgical History:  Procedure Laterality Date  . CESAREAN SECTION  05/08/2001   x1   Social History   Socioeconomic History  . Marital status: Single    Spouse name: Not on file  . Number of children: 1  . Years of education: 68  . Highest education level: Not on file  Occupational History    Employer: [employer]  Social Needs  . Financial resource strain: Not on file  . Food insecurity:    Worry: Not on file    Inability: Not on file  . Transportation needs:    Medical: Not on file    Non-medical: Not on file  Tobacco Use  . Smoking status: Never Smoker  . Smokeless tobacco: Never Used  Substance and Sexual Activity  . Alcohol use: No    Alcohol/week: 0.0 standard drinks  . Drug use: No  . Sexual activity: Yes    Birth control/protection: Pill  Lifestyle  . Physical activity:    Days per week: Not on file    Minutes per session: Not on file  . Stress:  Not on file  Relationships  . Social connections:    Talks on phone: Not on file    Gets together: Not on file    Attends religious service: Not on file    Active member of club or organization: Not on file    Attends meetings of clubs or organizations: Not on file    Relationship status: Not on file  . Intimate partner violence:    Fear of current or ex partner: Not on file    Emotionally abused: Not on file    Physically abused: Not on file    Forced sexual activity: Not on file  Other Topics Concern  . Not on file  Social History Narrative  . Not on file   Family History  Problem Relation Age of Onset  . Healthy Mother   . Hypertension Father   . Colon cancer Maternal Grandmother 53   Current Outpatient Medications on File Prior to Visit  Medication Sig  . ALPRAZolam (XANAX) 0.25 MG tablet Take 1 tablet (0.25 mg total) by mouth 2 (two) times daily as needed for anxiety. (Patient not taking: Reported on 10/01/2017)  . sertraline (ZOLOFT) 100 MG tablet Take 1 tablet (100 mg total) by mouth daily. (Patient not taking: Reported on 10/01/2017)   No current  facility-administered medications on file prior to visit.     Review of Systems Per HPI unless specifically indicated above     Objective:    BP 102/80 (BP Location: Right Arm, Patient Position: Sitting, Cuff Size: Normal)   Pulse 81   Resp 16   Wt 145 lb 12.8 oz (66.1 kg)   LMP 07/17/2017   SpO2 99%   BMI 22.84 kg/m   Wt Readings from Last 3 Encounters:  11/01/17 145 lb 12.8 oz (66.1 kg)  10/01/17 147 lb 6.4 oz (66.9 kg)  08/16/17 152 lb (68.9 kg)    Physical Exam  Constitutional: She is oriented to person, place, and time. She appears well-developed and well-nourished.  Cardiovascular: Normal rate and regular rhythm.  Pulmonary/Chest: Effort normal and breath sounds normal. Right breast exhibits no inverted nipple, no mass, no nipple discharge, no skin change and no tenderness. Left breast exhibits no inverted  nipple, no mass, no nipple discharge, no skin change and no tenderness. No breast swelling, tenderness, discharge or bleeding. Breasts are symmetrical.  Abdominal: Soft. Bowel sounds are normal.  Neurological: She is alert and oriented to person, place, and time.  Skin: Skin is warm and dry.  Psychiatric: She has a normal mood and affect. Her behavior is normal.   Results for orders placed or performed in visit on 08/16/17  Lipid Profile  Result Value Ref Range   Cholesterol, Total 232 (H) 100 - 199 mg/dL   Triglycerides 188 (H) 0 - 149 mg/dL   HDL 40 >41 mg/dL   VLDL Cholesterol Cal 77 (H) 5 - 40 mg/dL   LDL Calculated 660 (H) 0 - 99 mg/dL   Chol/HDL Ratio 5.8 (H) 0.0 - 4.4 ratio  TSH  Result Value Ref Range   TSH 3.150 0.450 - 4.500 uIU/mL  HgB A1c  Result Value Ref Range   Hgb A1c MFr Bld 5.0 4.8 - 5.6 %   Est. average glucose Bld gHb Est-mCnc 97 mg/dL  Comprehensive Metabolic Panel (CMET)  Result Value Ref Range   Glucose 83 65 - 99 mg/dL   BUN 16 6 - 24 mg/dL   Creatinine, Ser 6.30 0.57 - 1.00 mg/dL   GFR calc non Af Amer 97 >59 mL/min/1.73   GFR calc Af Amer 112 >59 mL/min/1.73   BUN/Creatinine Ratio 22 9 - 23   Sodium 140 134 - 144 mmol/L   Potassium 4.4 3.5 - 5.2 mmol/L   Chloride 98 96 - 106 mmol/L   CO2 26 20 - 29 mmol/L   Calcium 10.3 (H) 8.7 - 10.2 mg/dL   Total Protein 7.5 6.0 - 8.5 g/dL   Albumin 4.5 3.5 - 5.5 g/dL   Globulin, Total 3.0 1.5 - 4.5 g/dL   Albumin/Globulin Ratio 1.5 1.2 - 2.2   Bilirubin Total 0.3 0.0 - 1.2 mg/dL   Alkaline Phosphatase 65 39 - 117 IU/L   AST 27 0 - 40 IU/L   ALT 32 0 - 32 IU/L  HIV antibody (with reflex)  Result Value Ref Range   HIV Screen 4th Generation wRfx Non Reactive Non Reactive  CBC with Differential/Platelet  Result Value Ref Range   WBC 9.2 3.4 - 10.8 x10E3/uL   RBC 4.56 3.77 - 5.28 x10E6/uL   Hemoglobin 13.2 11.1 - 15.9 g/dL   Hematocrit 16.0 10.9 - 46.6 %   MCV 83 79 - 97 fL   MCH 28.9 26.6 - 33.0 pg   MCHC  34.9 31.5 - 35.7 g/dL   RDW  14.4 12.3 - 15.4 %   Platelets 291 150 - 450 x10E3/uL   Neutrophils 58 Not Estab. %   Lymphs 31 Not Estab. %   Monocytes 7 Not Estab. %   Eos 3 Not Estab. %   Basos 1 Not Estab. %   Neutrophils Absolute 5.4 1.4 - 7.0 x10E3/uL   Lymphocytes Absolute 2.9 0.7 - 3.1 x10E3/uL   Monocytes Absolute 0.6 0.1 - 0.9 x10E3/uL   EOS (ABSOLUTE) 0.3 0.0 - 0.4 x10E3/uL   Basophils Absolute 0.1 0.0 - 0.2 x10E3/uL   Immature Granulocytes 0 Not Estab. %   Immature Grans (Abs) 0.0 0.0 - 0.1 x10E3/uL      Assessment & Plan:  1. Annual physical exam  Screenings up to date.  2. Swelling of limb  Continue HCTZ.  - hydrochlorothiazide (MICROZIDE) 12.5 MG capsule; Take 1 capsule (12.5 mg total) by mouth daily.  Dispense: 90 capsule; Refill: 1 - drospirenone-ethinyl estradiol (YAZ) 3-0.02 MG tablet; Take 1 tablet by mouth daily.  Dispense: 3 Package; Refill: 3  3. Chronic nonintractable headache, unspecified headache type  She is currently taking Yaz. May be amenorrhea due to Yaz or perimenopause. Suggest trial off of Yaz to see if menstrual cycle returns.   - drospirenone-ethinyl estradiol (YAZ) 3-0.02 MG tablet; Take 1 tablet by mouth daily.  Dispense: 3 Package; Refill: 3  4. Amenorrhea  See #3.    Follow up plan: Return in about 6 months (around 05/04/2018).  Osvaldo Angst, PA-C [facility] 01/15/2018, 9:00 AM

## 2018-01-22 ENCOUNTER — Ambulatory Visit: Payer: 59 | Admitting: Physician Assistant

## 2018-03-18 ENCOUNTER — Encounter: Payer: Self-pay | Admitting: Physician Assistant

## 2018-04-20 ENCOUNTER — Encounter: Payer: Self-pay | Admitting: Physician Assistant

## 2018-04-22 ENCOUNTER — Encounter: Payer: Self-pay | Admitting: Physician Assistant

## 2018-04-22 ENCOUNTER — Ambulatory Visit: Payer: Managed Care, Other (non HMO) | Admitting: Physician Assistant

## 2018-04-22 VITALS — BP 122/80 | HR 66 | Temp 98.4°F | Wt 155.8 lb

## 2018-04-22 DIAGNOSIS — J0110 Acute frontal sinusitis, unspecified: Secondary | ICD-10-CM

## 2018-04-22 DIAGNOSIS — J00 Acute nasopharyngitis [common cold]: Secondary | ICD-10-CM | POA: Diagnosis not present

## 2018-04-22 MED ORDER — AMOXICILLIN-POT CLAVULANATE 875-125 MG PO TABS: 1.0000 | ORAL_TABLET | Freq: Two times a day (BID) | ORAL | 0 refills | Status: AC

## 2018-04-22 MED ORDER — AZELASTINE-FLUTICASONE 137-50 MCG/ACT NA SUSP: 1.0000 | Freq: Two times a day (BID) | NASAL | 3 refills | Status: DC

## 2018-04-22 NOTE — Patient Instructions (Addendum)
Allegra/Claritin/Zyrtec or Xyzal DAILY ( I tend to find xyzal works best for people) Dymista nasal spray   Eustachian Tube Dysfunction  Eustachian tube dysfunction refers to a condition in which a blockage develops in the narrow passage that connects the middle ear to the back of the nose (eustachian tube). The eustachian tube regulates air pressure in the middle ear by letting air move between the ear and nose. It also helps to drain fluid from the middle ear space. Eustachian tube dysfunction can affect one or both ears. When the eustachian tube does not function properly, air pressure, fluid, or both can build up in the middle ear. What are the causes? This condition occurs when the eustachian tube becomes blocked or cannot open normally. Common causes of this condition include:  Ear infections.  Colds and other infections that affect the nose, mouth, and throat (upper respiratory tract).  Allergies.  Irritation from cigarette smoke.  Irritation from stomach acid coming up into the esophagus (gastroesophageal reflux). The esophagus is the tube that carries food from the mouth to the stomach.  Sudden changes in air pressure, such as from descending in an airplane or scuba diving.  Abnormal growths in the nose or throat, such as: ? Growths that line the nose (nasal polyps). ? Abnormal growth of cells (tumors). ? Enlarged tissue at the back of the throat (adenoids). What increases the risk? You are more likely to develop this condition if:  You smoke.  You are overweight.  You are a child who has: ? Certain birth defects of the mouth, such as cleft palate. ? Large tonsils or adenoids. What are the signs or symptoms? Common symptoms of this condition include:  A feeling of fullness in the ear.  Ear pain.  Clicking or popping noises in the ear.  Ringing in the ear.  Hearing loss.  Loss of balance.  Dizziness. Symptoms may get worse when the air pressure around you  changes, such as when you travel to an area of high elevation, fly on an airplane, or go scuba diving. How is this diagnosed? This condition may be diagnosed based on:  Your symptoms.  A physical exam of your ears, nose, and throat.  Tests, such as those that measure: ? The movement of your eardrum (tympanogram). ? Your hearing (audiometry). How is this treated? Treatment depends on the cause and severity of your condition.  In mild cases, you may relieve your symptoms by moving air into your ears. This is called "popping the ears."  In more severe cases, or if you have symptoms of fluid in your ears, treatment may include: ? Medicines to relieve congestion (decongestants). ? Medicines that treat allergies (antihistamines). ? Nasal sprays or ear drops that contain medicines that reduce swelling (steroids). ? A procedure to drain the fluid in your eardrum (myringotomy). In this procedure, a small tube is placed in the eardrum to:  Drain the fluid.  Restore the air in the middle ear space. ? A procedure to insert a balloon device through the nose to inflate the opening of the eustachian tube (balloon dilation). Follow these instructions at home: Lifestyle  Do not do any of the following until your health care provider approves: ? Travel to high altitudes. ? Fly in airplanes. ? Work in a Estate agent or room. ? Scuba dive.  Do not use any products that contain nicotine or tobacco, such as cigarettes and e-cigarettes. If you need help quitting, ask your health care provider.  Keep your  ears dry. Wear fitted earplugs during showering and bathing. Dry your ears completely after. General instructions  Take over-the-counter and prescription medicines only as told by your health care provider.  Use techniques to help pop your ears as recommended by your health care provider. These may include: ? Chewing gum. ? Yawning. ? Frequent, forceful swallowing. ? Closing your mouth,  holding your nose closed, and gently blowing as if you are trying to blow air out of your nose.  Keep all follow-up visits as told by your health care provider. This is important. Contact a health care provider if:  Your symptoms do not go away after treatment.  Your symptoms come back after treatment.  You are unable to pop your ears.  You have: ? A fever. ? Pain in your ear. ? Pain in your head or neck. ? Fluid draining from your ear.  Your hearing suddenly changes.  You become very dizzy.  You lose your balance. Summary  Eustachian tube dysfunction refers to a condition in which a blockage develops in the eustachian tube.  It can be caused by ear infections, allergies, inhaled irritants, or abnormal growths in the nose or throat.  Symptoms include ear pain, hearing loss, or ringing in the ears.  Mild cases are treated with maneuvers to unblock the ears, such as yawning or ear popping.  Severe cases are treated with medicines. Surgery may also be done (rare). This information is not intended to replace advice given to you by your health care provider. Make sure you discuss any questions you have with your health care provider. Document Released: 04/01/2015 Document Revised: 06/25/2017 Document Reviewed: 06/25/2017 Elsevier Interactive Patient Education  2019 [employer].

## 2018-04-22 NOTE — Progress Notes (Signed)
Patient: Monica MaesSherry L Metivier Female    DOB: July 02, 1970   48 y.o.   MRN: [id number] Visit Date: 04/24/2018  Today's Provider: Trey SailorsAdriana M Jojuan Champney, PA-C   Chief Complaint  Patient presents with  . URI   Subjective:     HPI  Upper Respiratory Infection: Patient complains of symptoms of a URI. Symptoms include congestion, plugged sensation in both ears and sore throat. Onset of symptoms was 2 weeks ago, gradually worsening since that time. She also c/o facial pain, headache described as pressure , nasal congestion, no  fever, post nasal drip, sinus pressure and sneezing for the past 2 weeks .  She is drinking plenty of fluids. Evaluation to date: nonegt to date: decongestants Mucinex Sudafed. She describes a lot of pain and pressure behind her ears.     No Known Allergies   Current Outpatient Medications:  .  drospirenone-ethinyl estradiol (YAZ) 3-0.02 MG tablet, Take 1 tablet by mouth daily., Disp: 3 Package, Rfl: 3 .  ALPRAZolam (XANAX) 0.25 MG tablet, Take 1 tablet (0.25 mg total) by mouth 2 (two) times daily as needed for anxiety. (Patient not taking: Reported on 10/01/2017), Disp: 30 tablet, Rfl: 0 .  amoxicillin-clavulanate (AUGMENTIN) 875-125 MG tablet, Take 1 tablet by mouth 2 (two) times daily for 7 days., Disp: 14 tablet, Rfl: 0 .  azelastine (ASTELIN) 0.1 % nasal spray, Place 1 spray into both nostrils 2 (two) times daily. Use in each nostril as directed, Disp: 30 mL, Rfl: 12 .  Azelastine-Fluticasone 137-50 MCG/ACT SUSP, Place 1 spray into the nose 2 (two) times daily., Disp: 23 g, Rfl: 3 .  fluticasone (FLONASE) 50 MCG/ACT nasal spray, Place 2 sprays into both nostrils daily., Disp: 16 g, Rfl: 6 .  hydrochlorothiazide (MICROZIDE) 12.5 MG capsule, Take 1 capsule (12.5 mg total) by mouth daily., Disp: 90 capsule, Rfl: 1 .  sertraline (ZOLOFT) 100 MG tablet, Take 1 tablet (100 mg total) by mouth daily. (Patient not taking: Reported on 10/01/2017), Disp: 30 tablet, Rfl: 0  Review  of Systems  HENT: Positive for congestion, ear pain, postnasal drip, sinus pressure, sinus pain and sneezing.   Neurological: Positive for headaches.    Social History   Tobacco Use  . Smoking status: Never Smoker  . Smokeless tobacco: Never Used  Substance Use Topics  . Alcohol use: No    Alcohol/week: 0.0 standard drinks      Objective:   BP 122/80 (BP Location: Left Arm, Patient Position: Sitting, Cuff Size: Normal)   Pulse 66   Temp 98.4 F (36.9 C) (Oral)   Wt 155 lb 12.8 oz (70.7 kg)   SpO2 97%   BMI 24.40 kg/m  Vitals:   04/22/18 1233  BP: 122/80  Pulse: 66  Temp: 98.4 F (36.9 C)  TempSrc: Oral  SpO2: 97%  Weight: 155 lb 12.8 oz (70.7 kg)     Physical Exam Constitutional:      Appearance: She is well-developed.  HENT:     Head:     Comments: TMs opaque bilaterally .    Right Ear: External ear normal.     Left Ear: External ear normal.     Mouth/Throat:     Pharynx: No oropharyngeal exudate.  Eyes:     General:        Right eye: Discharge present.        Left eye: Discharge present. Neck:     Musculoskeletal: Neck supple.  Cardiovascular:     Rate  and Rhythm: Normal rate and regular rhythm.     Heart sounds: Normal heart sounds.  Pulmonary:     Effort: Pulmonary effort is normal. No respiratory distress.     Breath sounds: Normal breath sounds. No wheezing or rales.  Lymphadenopathy:     Cervical: No cervical adenopathy.  Skin:    General: Skin is warm and dry.  Psychiatric:        Behavior: Behavior normal.         Assessment & Plan    1. Acute rhinitis  Exam benign. Some fluid behind her ears though not infected. Needs to take daily 2nd gen antihistamine and nasal spray as below. Do think her symptoms are more related to congestion rather than bacterial infection. Antibiotic given if symptoms worsen.   - Azelastine-Fluticasone 137-50 MCG/ACT SUSP; Place 1 spray into the nose 2 (two) times daily.  Dispense: 23 g; Refill: 3  2. Acute  non-recurrent frontal sinusitis  - amoxicillin-clavulanate (AUGMENTIN) 875-125 MG tablet; Take 1 tablet by mouth 2 (two) times daily for 7 days.  Dispense: 14 tablet; Refill: 0  The entirety of the information documented in the History of Present Illness, Review of Systems and Physical Exam were personally obtained by me. Portions of this information were initially documented by Rondel BatonSulibeya Dimas, CMA and reviewed by me for thoroughness and accuracy.   Return if symptoms worsen or fail to improve.      Trey SailorsAdriana M Alizzon Dioguardi, PA-C  [facility]

## 2018-04-24 ENCOUNTER — Ambulatory Visit: Payer: 59 | Admitting: Physician Assistant

## 2018-04-24 MED ORDER — AZELASTINE HCL 0.1 % NA SOLN: 1.0000 | Freq: Two times a day (BID) | NASAL | 12 refills | Status: DC

## 2018-04-24 MED ORDER — FLUTICASONE PROPIONATE 50 MCG/ACT NA SUSP: 2.0000 | Freq: Every day | NASAL | 6 refills | Status: DC

## 2018-05-05 ENCOUNTER — Ambulatory Visit: Payer: Self-pay | Admitting: Physician Assistant

## 2018-05-26 ENCOUNTER — Other Ambulatory Visit: Payer: Self-pay

## 2018-05-26 ENCOUNTER — Ambulatory Visit
Admission: EM | Admit: 2018-05-26 | Discharge: 2018-05-26 | Disposition: A | Discharge status: Home / Self Care | Payer: Managed Care, Other (non HMO) | Attending: Family Medicine | Admitting: Family Medicine

## 2018-05-26 ENCOUNTER — Ambulatory Visit: Payer: Managed Care, Other (non HMO) | Admitting: Physician Assistant

## 2018-05-26 DIAGNOSIS — J019 Acute sinusitis, unspecified: Secondary | ICD-10-CM | POA: Diagnosis not present

## 2018-05-26 DIAGNOSIS — J069 Acute upper respiratory infection, unspecified: Secondary | ICD-10-CM

## 2018-05-26 DIAGNOSIS — B9789 Other viral agents as the cause of diseases classified elsewhere: Secondary | ICD-10-CM | POA: Diagnosis not present

## 2018-05-26 MED ORDER — PREDNISONE 20 MG PO TABS: 20.0000 mg | ORAL_TABLET | Freq: Every day | ORAL | 0 refills | Status: DC

## 2018-05-26 NOTE — ED Provider Notes (Signed)
[facility]    CSN: [id number] Arrival date & time: 05/26/18  6269     History   Chief Complaint Chief Complaint  Patient presents with  . Sinus Problem    HPI Monica Long is a 48 y.o. female.   The history is provided by the patient.  Sinus Problem   URI  Presenting symptoms: congestion, facial pain and rhinorrhea   Severity:  Moderate Onset quality:  Sudden Duration:  4 days Timing:  Constant Progression:  Unchanged Chronicity:  New Relieved by:  Nothing Ineffective treatments:  OTC medications Associated symptoms: sinus pain   Associated symptoms: no wheezing   Risk factors: sick contacts     Past Medical History:  Diagnosis Date  . Anxiety    panic attack  . Depression 02/08/2014  . History of mammogram 01/07/2014; 05-09-15   birad 2; neg  . History of Papanicolaou smear of cervix 01/07/2014   -/-  . Hypercholesteremia   . Hypertriglyceridemia   . Kidney stone 02/2015  . Peripheral vascular disease [facility])     Patient Active Problem List   Diagnosis Date Noted  . Varicose veins of leg with pain, bilateral 06/19/2016  . Swelling of limb 06/19/2016    Past Surgical History:  Procedure Laterality Date  . CESAREAN SECTION  05/08/2001   x1    OB History    Gravida  1   Para  1   Term      Preterm  1   AB      Living  1     SAB      TAB      Ectopic      Multiple      Live Births  1            Home Medications    Prior to Admission medications   Medication Sig Start Date End Date Taking? Authorizing Provider  Azelastine-Fluticasone 137-50 MCG/ACT SUSP Place 1 spray into the nose 2 (two) times daily. 04/22/18  Yes Osvaldo Angst M, PA-C  drospirenone-ethinyl estradiol (YAZ) 3-0.02 MG tablet Take 1 tablet by mouth daily. 11/01/17  Yes Osvaldo Angst M, PA-C  ALPRAZolam Prudy Feeler) 0.25 MG tablet Take 1 tablet (0.25 mg total) by mouth 2 (two) times daily as needed for anxiety. Patient not taking: Reported on  10/01/2017 12/26/16   Copland, Alicia B, PA-C  azelastine (ASTELIN) 0.1 % nasal spray Place 1 spray into both nostrils 2 (two) times daily. Use in each nostril as directed 04/24/18   Trey Sailors, PA-C  fluticasone [facility]) 50 MCG/ACT nasal spray Place 2 sprays into both nostrils daily. 04/24/18   Trey Sailors, PA-C  hydrochlorothiazide (MICROZIDE) 12.5 MG capsule Take 1 capsule (12.5 mg total) by mouth daily. 11/01/17 01/30/18  Trey Sailors, PA-C  predniSONE (DELTASONE) 20 MG tablet Take 1 tablet (20 mg total) by mouth daily. 05/26/18   Payton Mccallum, MD  sertraline (ZOLOFT) 100 MG tablet Take 1 tablet (100 mg total) by mouth daily. Patient not taking: Reported on 10/01/2017 12/26/16   Copland, Ilona Sorrel, PA-C    Family History Family History  Problem Relation Age of Onset  . Healthy Mother   . Hypertension Father   . Colon cancer Maternal Grandmother 5    Social History Social History   Tobacco Use  . Smoking status: Never Smoker  . Smokeless tobacco: Never Used  Substance Use Topics  . Alcohol use: No    Alcohol/week: 0.0 standard drinks  .  Drug use: No     Allergies   Amoxicillin   Review of Systems Review of Systems  HENT: Positive for congestion, rhinorrhea and sinus pain.   Respiratory: Negative for wheezing.      Physical Exam Triage Vital Signs ED Triage Vitals  Enc Vitals Group     BP 05/26/18 0855 132/82     Pulse Rate 05/26/18 0855 90     Resp 05/26/18 0855 16     Temp 05/26/18 0855 98.2 F (36.8 C)     Temp Source 05/26/18 0855 Oral     SpO2 05/26/18 0855 100 %     Weight 05/26/18 0853 155 lb (70.3 kg)     Height 05/26/18 0853  (1.702 m)     Head Circumference --      Peak Flow --      Pain Score 05/26/18 0853 7     Pain Loc --      Pain Edu? --      Excl. in GC? --    No data found.  Updated Vital Signs BP 132/82 (BP Location: Right Arm)   Pulse 90   Temp 98.2 F (36.8 C) (Oral)   Resp 16   Ht  (1.702 m)   Wt 70.3  kg   LMP 04/28/2018   SpO2 100%   BMI 24.28 kg/m   Visual Acuity Right Eye Distance:   Left Eye Distance:   Bilateral Distance:    Right Eye Near:   Left Eye Near:    Bilateral Near:     Physical Exam Vitals signs and nursing note reviewed.  Constitutional:      General: She is not in acute distress.    Appearance: She is well-developed. She is not toxic-appearing or diaphoretic.  HENT:     Head: Normocephalic and atraumatic.     Right Ear: Tympanic membrane, ear canal and external ear normal.     Left Ear: Tympanic membrane, ear canal and external ear normal.     Nose: Mucosal edema and rhinorrhea present. No nasal deformity, septal deviation or laceration.     Mouth/Throat:     Pharynx: Uvula midline. No oropharyngeal exudate.  Eyes:     General: No scleral icterus.       Right eye: No discharge.        Left eye: No discharge.  Neck:     Musculoskeletal: Normal range of motion and neck supple.     Thyroid: No thyromegaly.  Cardiovascular:     Rate and Rhythm: Normal rate and regular rhythm.     Heart sounds: Normal heart sounds.  Pulmonary:     Effort: Pulmonary effort is normal. No respiratory distress.     Breath sounds: Normal breath sounds. No stridor. No wheezing, rhonchi or rales.  Lymphadenopathy:     Cervical: No cervical adenopathy.  Neurological:     Mental Status: She is alert.      UC Treatments / Results  Labs (all labs ordered are listed, but only abnormal results are displayed) Labs Reviewed - No data to display  EKG None  Radiology No results found.  Procedures Procedures (including critical care time)  Medications Ordered in UC Medications - No data to display  Initial Impression / Assessment and Plan / UC Course  I have reviewed the triage vital signs and the nursing notes.  Pertinent labs & imaging results that were available during my care of the patient were reviewed by me and considered in  my medical decision making (see  chart for details).      Final Clinical Impressions(s) / UC Diagnoses   Final diagnoses:  Viral URI  Acute viral sinusitis     Discharge Instructions     Sudafed 12 hour twice daily Afrin nasal spray 15-20 prior to [facility]     ED Prescriptions    Medication Sig Dispense Auth. Provider   predniSONE (DELTASONE) 20 MG tablet Take 1 tablet (20 mg total) by mouth daily. 5 tablet Payton Mccallum, MD     1.  diagnosis reviewed with patient 2. rx as per orders above; reviewed possible side effects, interactions, risks and benefits  3. Recommend supportive treatment as above 4. Follow-up prn if symptoms worsen or don't improve  Controlled Substance Prescriptions [place] Controlled Substance Registry consulted? Not Applicable   Payton Mccallum, MD 05/26/18 1200

## 2018-05-26 NOTE — ED Triage Notes (Signed)
Patient complains of sinus pain and pressure, drainage that started on Friday and has remained constant. Patient states that she has tried multiple OTC without relief.

## 2018-05-26 NOTE — Progress Notes (Deleted)
       Patient: Monica Long Female    DOB: March 03, 1971   48 y.o.   MRN: [id number] Visit Date: 05/26/2018  Today's Provider: Trey Sailors, PA-C   No chief complaint on file.  Subjective:     URI      Allergies  Allergen Reactions  . Amoxicillin     Severe vomiting      Current Outpatient Medications:  .  ALPRAZolam (XANAX) 0.25 MG tablet, Take 1 tablet (0.25 mg total) by mouth 2 (two) times daily as needed for anxiety. (Patient not taking: Reported on 10/01/2017), Disp: 30 tablet, Rfl: 0 .  azelastine (ASTELIN) 0.1 % nasal spray, Place 1 spray into both nostrils 2 (two) times daily. Use in each nostril as directed, Disp: 30 mL, Rfl: 12 .  Azelastine-Fluticasone 137-50 MCG/ACT SUSP, Place 1 spray into the nose 2 (two) times daily., Disp: 23 g, Rfl: 3 .  drospirenone-ethinyl estradiol (YAZ) 3-0.02 MG tablet, Take 1 tablet by mouth daily., Disp: 3 Package, Rfl: 3 .  fluticasone (FLONASE) 50 MCG/ACT nasal spray, Place 2 sprays into both nostrils daily., Disp: 16 g, Rfl: 6 .  hydrochlorothiazide (MICROZIDE) 12.5 MG capsule, Take 1 capsule (12.5 mg total) by mouth daily., Disp: 90 capsule, Rfl: 1 .  predniSONE (DELTASONE) 20 MG tablet, Take 1 tablet (20 mg total) by mouth daily., Disp: 5 tablet, Rfl: 0 .  sertraline (ZOLOFT) 100 MG tablet, Take 1 tablet (100 mg total) by mouth daily. (Patient not taking: Reported on 10/01/2017), Disp: 30 tablet, Rfl: 0  Review of Systems  Social History   Tobacco Use  . Smoking status: Never Smoker  . Smokeless tobacco: Never Used  Substance Use Topics  . Alcohol use: No    Alcohol/week: 0.0 standard drinks      Objective:   LMP 04/28/2018  There were no vitals filed for this visit.   Physical Exam      Assessment & Plan        Trey Sailors, PA-C  [facility]

## 2018-05-26 NOTE — Discharge Instructions (Signed)
Sudafed 12 hour twice daily Afrin nasal spray 15-20 prior to [facility]

## 2018-08-14 ENCOUNTER — Other Ambulatory Visit: Payer: Self-pay | Admitting: Physician Assistant

## 2018-08-14 DIAGNOSIS — M7989 Other specified soft tissue disorders: Secondary | ICD-10-CM

## 2018-08-14 DIAGNOSIS — G8929 Other chronic pain: Secondary | ICD-10-CM

## 2018-08-19 ENCOUNTER — Other Ambulatory Visit: Payer: Self-pay | Admitting: Physician Assistant

## 2018-08-19 DIAGNOSIS — G8929 Other chronic pain: Secondary | ICD-10-CM

## 2018-08-19 DIAGNOSIS — M7989 Other specified soft tissue disorders: Secondary | ICD-10-CM

## 2018-08-19 MED ORDER — DROSPIRENONE-ETHINYL ESTRADIOL 3-0.02 MG PO TABS: 1.0000 | ORAL_TABLET | Freq: Every day | ORAL | 3 refills | Status: DC

## 2018-08-19 NOTE — Telephone Encounter (Signed)
[facility] faxed refill request for the following medications:  drospirenone-ethinyl estradiol (YAZ) 3-0.02 MG tablet    Please advise.

## 2018-08-31 IMAGING — CR DG CHEST 2V
2 series · 2 of 2 positions shown · non-contrast
Comparison: None.

CLINICAL DATA: Chest pain

EXAM:
CHEST  2 VIEW

[chest pa]
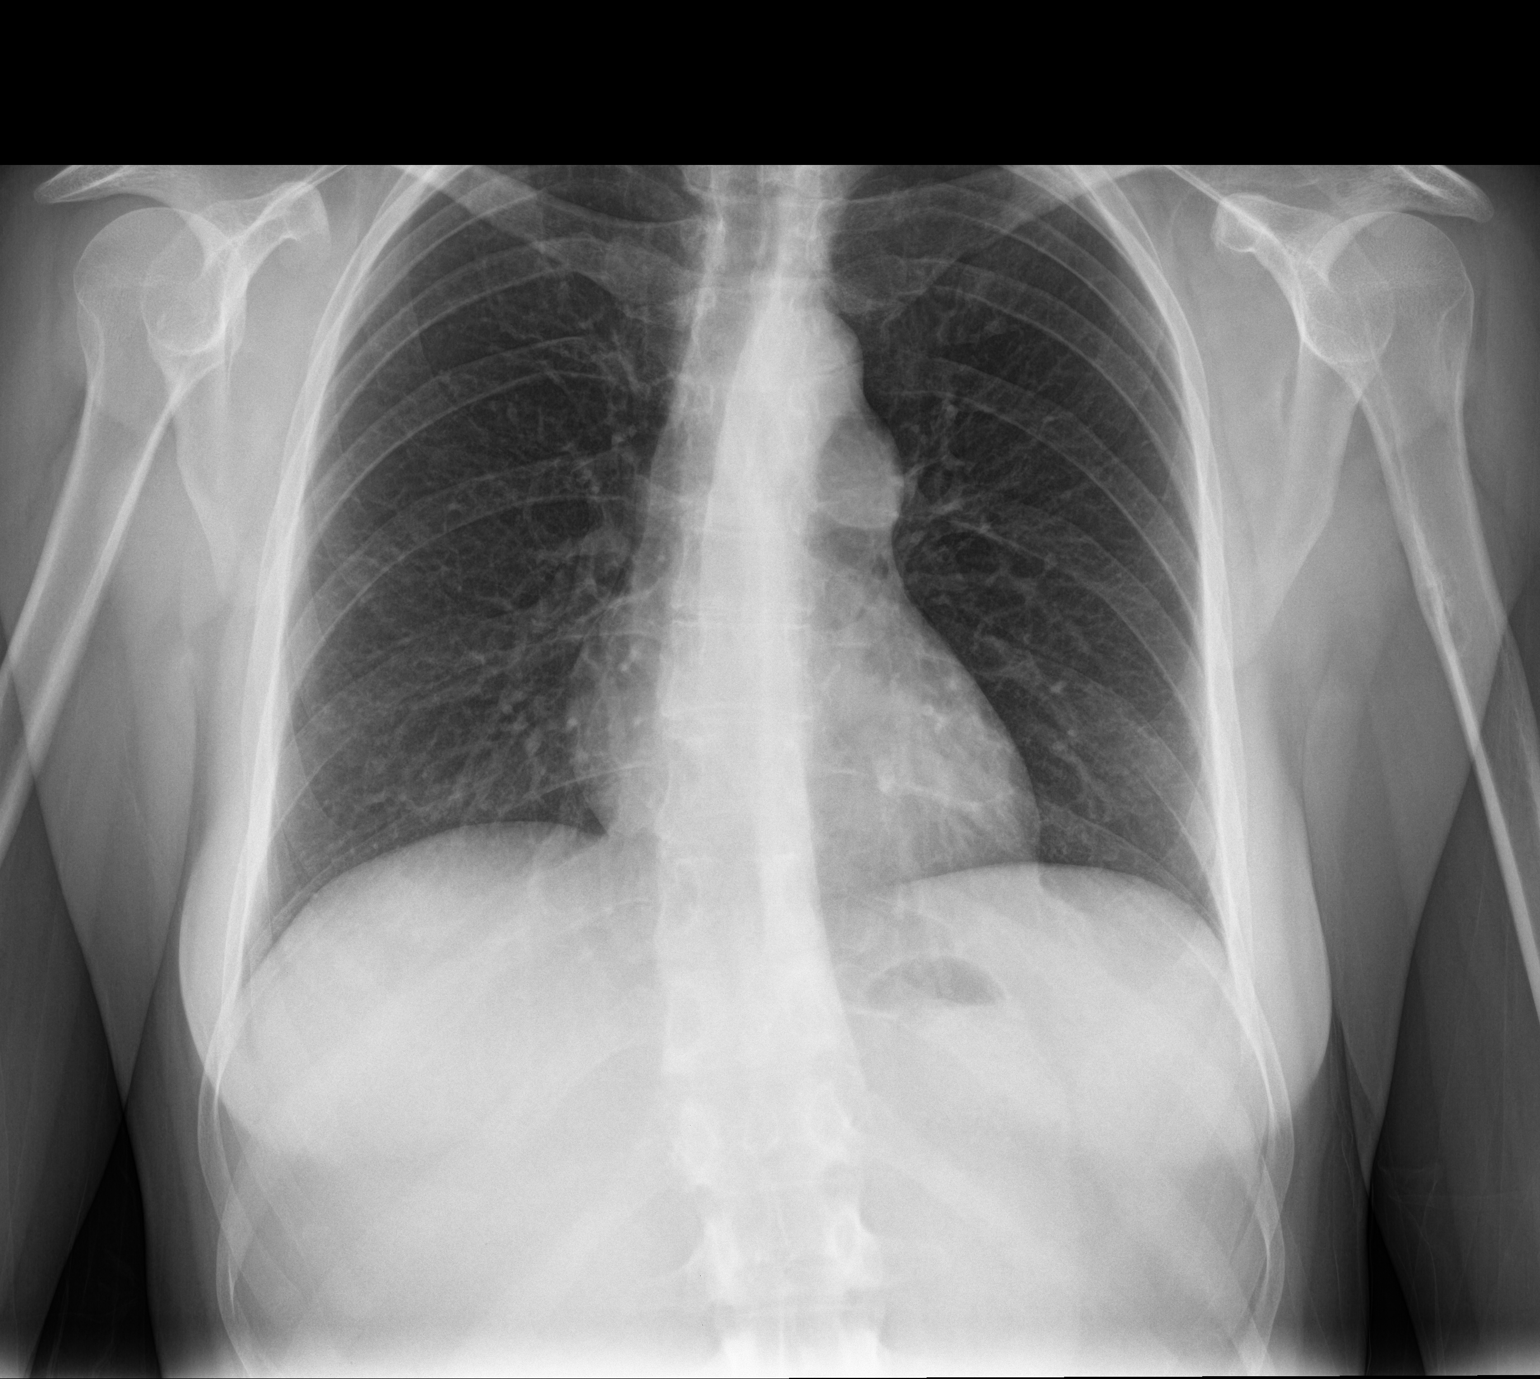

[chest lat]
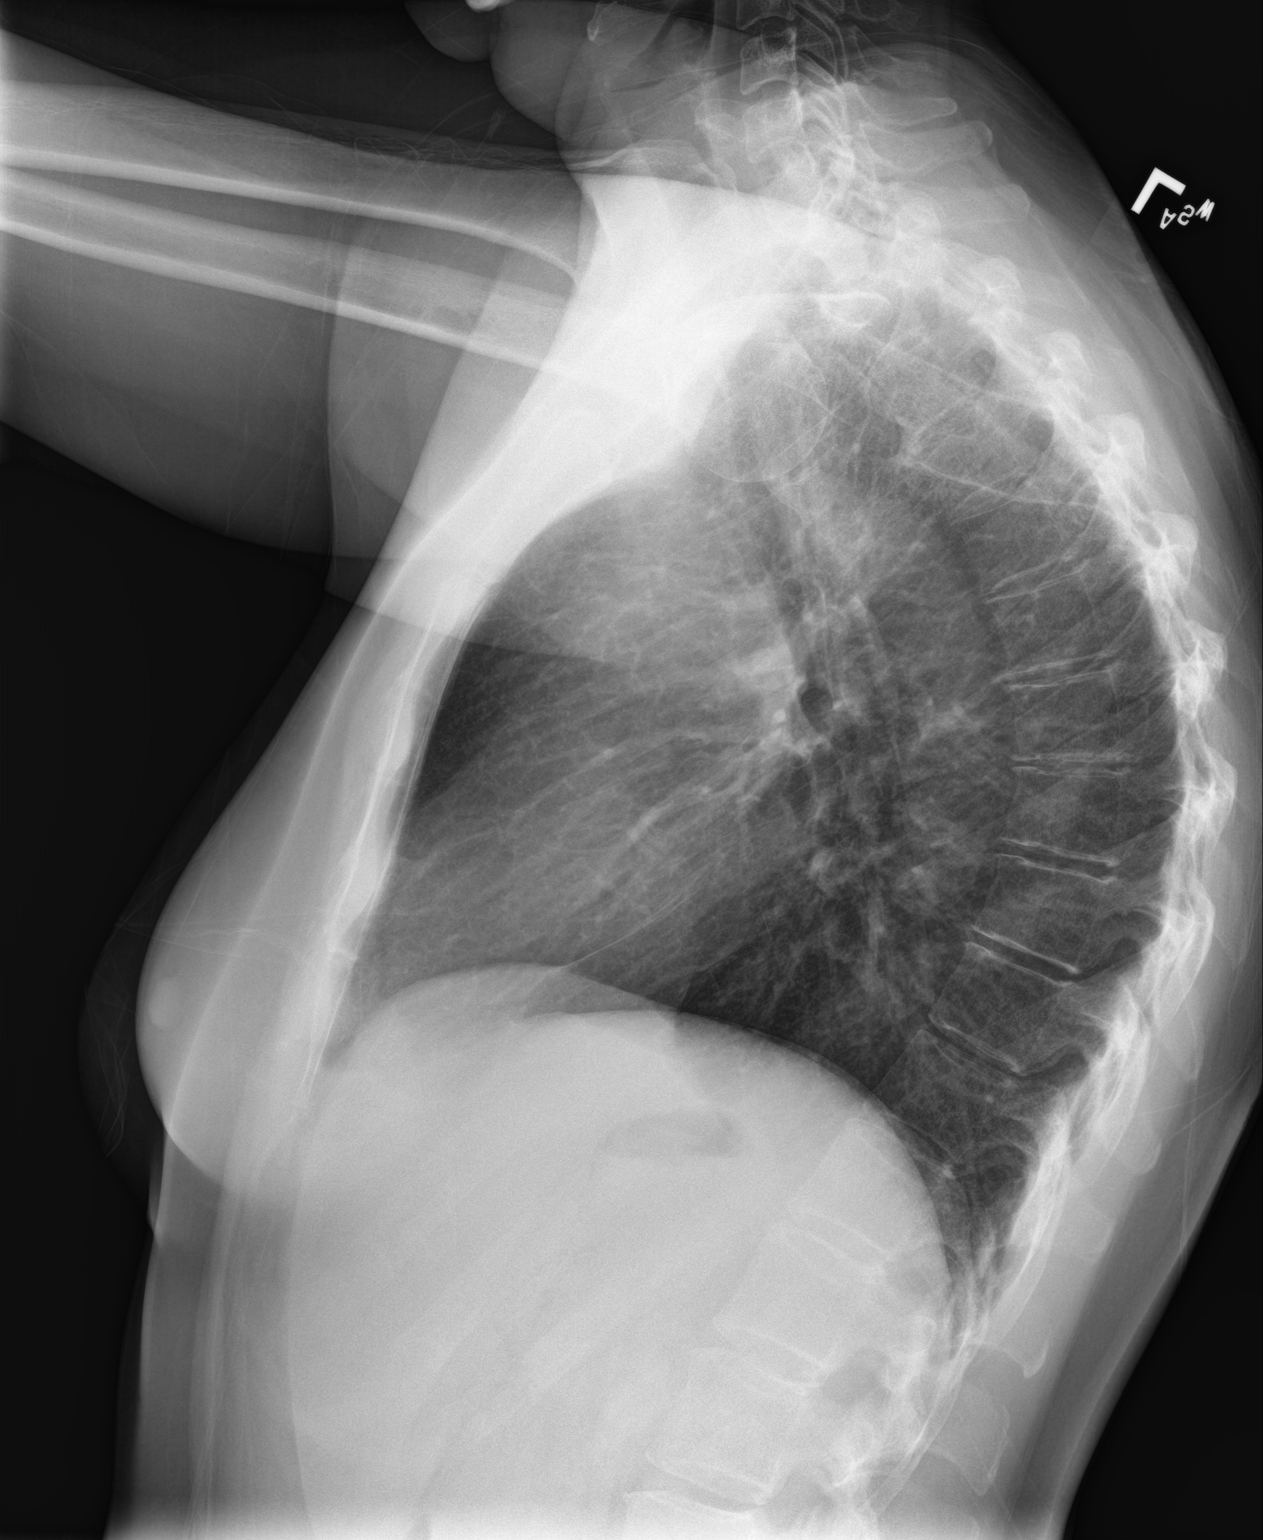

[2 of 2 positions shown; findings below may reference images not displayed]

FINDINGS: Lungs are clear. Heart size and pulmonary vascularity are normal. No
adenopathy. No pneumothorax. There is midthoracic dextroscoliosis.
IMPRESSION: No edema or consolidation.

## 2019-03-08 ENCOUNTER — Telehealth: Payer: Managed Care, Other (non HMO) | Admitting: Family

## 2019-03-08 DIAGNOSIS — J069 Acute upper respiratory infection, unspecified: Secondary | ICD-10-CM

## 2019-03-08 MED ORDER — FLUTICASONE PROPIONATE 50 MCG/ACT NA SUSP: 2.0000 | Freq: Every day | NASAL | 6 refills | Status: DC

## 2019-03-08 NOTE — Progress Notes (Signed)
E-Visit for Corona Virus Screening   Your current symptoms could be consistent with the coronavirus.  Many health care providers can now test patients at their office but not all are.  [facility] has multiple testing sites. For information on our COVID testing locations and hours go to [email or web address]  We are enrolling you in our MyChart Home Monitoring for COVID19 . Daily you will receive a questionnaire within the MyChart website. Our COVID 19 response team will be monitoring your responses daily.  Testing Information: The COVID-19 Community Testing sites will begin testing BY APPOINTMENT ONLY.  You can schedule online at [email or web address]  If you do not have access to a smart phone or computer you may call [phone] for an appointment.  Testing Locations: Appointment schedule is 8 am to 3:30 pm at all sites  [facility] indoors at [address] [place] [address] [facility]  indoors at [facility] [address], [place], [place] [address] [place] indoors at [address], [place] [address]  Additional testing sites in the Community:  . For [facility] Testing sites in [facility]  FarmerBuys.com.au  . For Pop-up testing sites in [place]  https://morgan-vargas.[email or web address]  . For Testing sites with regular hours https://onsms.org/[email or web address]  . For [facility] [email or web address]  . For [facility] EternalVitamin.dk  . For [facility] testing in [place] and [employer] EternalVitamin.dk  . For [facility] testing in [facility]   [email or web address].care/[email or web address]  For  more  information about community testing call [phone]   We are enrolling you in our MyChart Home Monitoring for COVID19 . Daily you will receive a questionnaire within the MyChart website. Our COVID 19 response team will be monitoring your responses daily.  Please quarantine yourself while awaiting your test results. If you develop fever/cough/breathlessness, please stay home for 10 days with improving symptoms and until you have had 24 hours of no fever (without taking a fever reducer).  You should wear a mask or cloth face covering over your nose and mouth if you must be around other people or animals, including pets (even at home). Try to stay at least 6 feet away from other people. This will protect the people around you.  Please continue good preventive care measures, including:  frequent hand-washing, avoid touching your face, cover coughs/sneezes, stay out of crowds and keep a 6 foot distance from others.  COVID-19 is a respiratory illness with symptoms that are similar to the flu. Symptoms are typically mild to moderate, but there have been cases of severe illness and death due to the virus.   The following symptoms may appear 2-14 days after exposure: . Fever . Cough . Shortness of breath or difficulty breathing . Chills . Repeated shaking with chills . Muscle pain . Headache . Sore throat . New loss of taste or smell . Fatigue . Congestion or runny nose . Nausea or vomiting . Diarrhea  Go to the nearest hospital ED for assessment if fever/cough/breathlessness are severe or illness seems like a threat to life.  It is vitally important that if you feel that you have an infection such as this virus or any other virus that you stay home and away from places where you may spread it to others.  You should avoid contact with people age 28 and older.   You can use medication such as For your congestion, I have prescribed Fluticasone nasal spray one spray in each nostril twice a day.  You  may also take acetaminophen (Tylenol) as needed for fever.  Reduce your risk of any infection by using the same precautions used for avoiding the common cold or flu:  Marland Kitchen Wash your hands often with soap and warm water for at least 20 seconds.  If soap and water are not readily available, use an alcohol-based hand sanitizer with at least 60% alcohol.  . If coughing or sneezing, cover your mouth and nose by coughing or sneezing into the elbow areas of your shirt or coat, into a tissue or into your sleeve (not your hands). . Avoid shaking hands with others and consider head nods or verbal greetings only. . Avoid touching your eyes, nose, or mouth with unwashed hands.  . Avoid close contact with people who are sick. . Avoid places or events with large numbers of people in one location, like concerts or sporting events. . Carefully consider travel plans you have or are making. . If you are planning any travel outside or inside the [place], visit the CDC's Travelers' Health webpage for the latest health notices. . If you have some symptoms but not all symptoms, continue to monitor at home and seek medical attention if your symptoms worsen. . If you are having a medical emergency, call 911.  HOME CARE . Only take medications as instructed by your medical team. . Drink plenty of fluids and get plenty of rest. . A steam or ultrasonic humidifier can help if you have congestion.   GET HELP RIGHT AWAY IF YOU HAVE EMERGENCY WARNING SIGNS** FOR COVID-19. If you or someone is showing any of these signs seek emergency medical care immediately. Call 911 or proceed to your closest emergency facility if: . You develop worsening high fever. . Trouble breathing . Bluish lips or face . Persistent pain or pressure in the chest . New confusion . Inability to wake or stay awake . You cough up blood. . Your symptoms become more severe  **This list is not all possible symptoms. Contact your medical provider for any  symptoms that are sever or concerning to you.  MAKE SURE YOU   Understand these instructions.  Will watch your condition.  Will get help right away if you are not doing well or get worse.  Your e-visit answers were reviewed by a board certified advanced clinical practitioner to complete your personal care plan.  Depending on the condition, your plan could have included both over the counter or prescription medications.  If there is a problem please reply once you have received a response from your provider.  Your safety is important to [place].  If you have drug allergies check your prescription carefully.    You can use MyChart to ask questions about today's visit, request a non-urgent call back, or ask for a work or school excuse for 24 hours related to this e-Visit. If it has been greater than 24 hours you will need to follow up with your provider, or enter a new e-Visit to address those concerns. You will get an e-mail in the next two days asking about your experience.  I hope that your e-visit has been valuable and will speed your recovery. Thank you for using e-visits.   Approximately 5 minutes was spent documenting and reviewing patient's chart.

## 2019-03-09 ENCOUNTER — Ambulatory Visit
Admission: EM | Admit: 2019-03-09 | Discharge: 2019-03-09 | Disposition: A | Discharge status: Home / Self Care | Payer: Managed Care, Other (non HMO) | Attending: Family Medicine | Admitting: Family Medicine

## 2019-03-09 ENCOUNTER — Other Ambulatory Visit: Payer: Self-pay

## 2019-03-09 DIAGNOSIS — Z20828 Contact with and (suspected) exposure to other viral communicable diseases: Secondary | ICD-10-CM | POA: Diagnosis not present

## 2019-03-09 DIAGNOSIS — J069 Acute upper respiratory infection, unspecified: Secondary | ICD-10-CM

## 2019-03-09 DIAGNOSIS — Z20822 Contact with and (suspected) exposure to covid-19: Secondary | ICD-10-CM

## 2019-03-09 MED ORDER — IPRATROPIUM BROMIDE 0.06 % NA SOLN: 2.0000 | Freq: Four times a day (QID) | NASAL | 0 refills | Status: DC | PRN

## 2019-03-09 MED ORDER — CETIRIZINE-PSEUDOEPHEDRINE ER 5-120 MG PO TB12: 1.0000 | ORAL_TABLET | Freq: Two times a day (BID) | ORAL | 0 refills | Status: DC

## 2019-03-09 NOTE — ED Triage Notes (Signed)
Pt reports sinus pain and pressure, upper teeth pain, mild headache and scratchy throat. Feels like previous sinus infections.

## 2019-03-09 NOTE — ED Provider Notes (Signed)
[facility]    CSN: [id number] Arrival date & time: 03/09/19  0815   History   Chief Complaint Chief Complaint  Patient presents with  . Facial Pain   HPI  48 year old female presents with URI symptoms.  Patient reports that she has not been feeling well since Saturday.  She reports sinus pain and pressure.  Also reports scratchy throat, headache.  Denies fever.  No cough.  No reported sick contacts.  No known exposures to COVID-19.  She has been using Motrin and Flonase without resolution.  Additionally, patient reports some upper dental pain.  Patient believes that she has a sinus infection.  No known exacerbating factors.  No other associated symptoms.  No other complaints.  PMH, Surgical Hx, Family Hx, Social History reviewed and updated as below.  Past Medical History:  Diagnosis Date  . Anxiety    panic attack  . Depression 02/08/2014  . History of mammogram 01/07/2014; 05-09-15   birad 2; neg  . History of Papanicolaou smear of cervix 01/07/2014   -/-  . Hypercholesteremia   . Hypertriglyceridemia   . Kidney stone 02/2015  . Peripheral vascular disease [facility](HCC)     Patient Active Problem List   Diagnosis Date Noted  . Varicose veins of leg with pain, bilateral 06/19/2016  . Swelling of limb 06/19/2016    Past Surgical History:  Procedure Laterality Date  . CESAREAN SECTION  05/08/2001   x1    OB History    Gravida  1   Para  1   Term      Preterm  1   AB      Living  1     SAB      TAB      Ectopic      Multiple      Live Births  1            Home Medications    Prior to Admission medications   Medication Sig Start Date End Date Taking? Authorizing Provider  cetirizine-pseudoephedrine (ZYRTEC-D) 5-120 MG tablet Take 1 tablet by mouth 2 (two) times daily. 03/09/19   Tommie Samsook, Ailanie Ruttan G, DO  drospirenone-ethinyl estradiol (YAZ) 3-0.02 MG tablet Take 1 tablet by mouth daily. 08/19/18   Trey SailorsPollak, Adriana M, PA-C  ipratropium  (ATROVENT) 0.06 % nasal spray Place 2 sprays into both nostrils 4 (four) times daily as needed for rhinitis. 03/09/19   Tommie Samsook, Shakur Lembo G, DO  azelastine (ASTELIN) 0.1 % nasal spray Place 1 spray into both nostrils 2 (two) times daily. Use in each nostril as directed 04/24/18 03/09/19  Trey SailorsPollak, Adriana M, PA-C  Azelastine-Fluticasone 137-50 MCG/ACT SUSP Place 1 spray into the nose 2 (two) times daily. 04/22/18 03/09/19  Trey SailorsPollak, Adriana M, PA-C  fluticasone (FLONASE) 50 MCG/ACT nasal spray Place 2 sprays into both nostrils daily. 03/08/19 03/09/19  Jannifer RodneyHawks, Christy A, FNP  hydrochlorothiazide (MICROZIDE) 12.5 MG capsule Take 1 capsule (12.5 mg total) by mouth daily. 11/01/17 03/09/19  Trey SailorsPollak, Adriana M, PA-C  sertraline (ZOLOFT) 100 MG tablet Take 1 tablet (100 mg total) by mouth daily. Patient not taking: Reported on 10/01/2017 12/26/16 03/09/19  Copland, Ilona SorrelAlicia B, PA-C    Family History Family History  Problem Relation Age of Onset  . Healthy Mother   . Hypertension Father   . Colon cancer Maternal Grandmother 3654    Social History Social History   Tobacco Use  . Smoking status: Never Smoker  . Smokeless tobacco: Never Used  Substance  Use Topics  . Alcohol use: No    Alcohol/week: 0.0 standard drinks  . Drug use: No     Allergies   Amoxicillin   Review of Systems Review of Systems  Constitutional: Negative for fever.  HENT: Positive for sinus pressure, sinus pain and sore throat.   Respiratory: Negative for cough.    Physical Exam Triage Vital Signs ED Triage Vitals  Enc Vitals Group     BP 03/09/19 0832 (!) 143/72     Pulse Rate 03/09/19 0832 (!) 108     Resp 03/09/19 0832 17     Temp 03/09/19 0832 98.1 F (36.7 C)     Temp src --      SpO2 03/09/19 0832 100 %     Weight 03/09/19 0834 150 lb (68 kg)     Height 03/09/19 0834 5\' 7"  (1.702 m)     Head Circumference --      Peak Flow --      Pain Score 03/09/19 0834 7     Pain Loc --      Pain Edu? --      Excl. in GC? --      Updated Vital Signs BP (!) 143/72 (BP Location: Left Arm)   Pulse (!) 108   Temp 98.1 F (36.7 C)   Resp 17   Ht 5\' 7"  (1.702 m)   Wt 68 kg   LMP 02/19/2019   SpO2 100%   BMI 23.49 kg/m   Visual Acuity Right Eye Distance:   Left Eye Distance:   Bilateral Distance:    Right Eye Near:   Left Eye Near:    Bilateral Near:     Physical Exam Vitals and nursing note reviewed.  Constitutional:      General: She is not in acute distress.    Appearance: Normal appearance. She is not ill-appearing.  HENT:     Head: Normocephalic and atraumatic.     Right Ear: Tympanic membrane normal.     Left Ear: Tympanic membrane normal.     Nose:     Comments: Mucus noted in the nose. Maxillary sinus tenderness to palpation bilaterally. Eyes:     General:        Right eye: No discharge.        Left eye: No discharge.     Conjunctiva/sclera: Conjunctivae normal.  Cardiovascular:     Rate and Rhythm: Regular rhythm. Tachycardia present.     Heart sounds: No murmur.  Pulmonary:     Effort: Pulmonary effort is normal.     Breath sounds: Normal breath sounds. No wheezing, rhonchi or rales.  Musculoskeletal:     Cervical back: Neck supple.  Lymphadenopathy:     Cervical: No cervical adenopathy.  Neurological:     Mental Status: She is alert.  Psychiatric:        Mood and Affect: Mood normal.        Behavior: Behavior normal.    UC Treatments / Results  Labs (all labs ordered are listed, but only abnormal results are displayed) Labs Reviewed  NOVEL CORONAVIRUS, NAA (HOSP ORDER, SEND-OUT TO REF LAB; TAT 18-24 HRS)    EKG   Radiology No results found.  Procedures Procedures (including critical care time)  Medications Ordered in UC Medications - No data to display  Initial Impression / Assessment and Plan / UC Course  I have reviewed the triage vital signs and the nursing notes.  Pertinent labs & imaging results that were available  during my care of the patient were  reviewed by me and considered in my medical decision making (see chart for details).    48 year old female presents with a viral URI.  Possible COVID-19.  Awaiting test results.  Treating with Atrovent nasal spray and Zyrtec-D.  Too early to diagnose bacterial sinusitis given only 2 days of illness.  Final Clinical Impressions(s) / UC Diagnoses   Final diagnoses:  Viral URI  Encounter for laboratory testing for COVID-19 virus     Discharge Instructions     Rest.  Fluids.  Medications as needed.  Awaiting COVID test results - 24-48 hours.  Take care  Dr. Lacinda Axon    ED Prescriptions    Medication Sig Dispense Auth. Provider   ipratropium (ATROVENT) 0.06 % nasal spray Place 2 sprays into both nostrils 4 (four) times daily as needed for rhinitis. 15 mL Whitney Bingaman G, DO   cetirizine-pseudoephedrine (ZYRTEC-D) 5-120 MG tablet Take 1 tablet by mouth 2 (two) times daily. 30 tablet Coral Spikes, DO     PDMP not reviewed this encounter.   Thersa Salt [place], [place] 03/09/19 [phone]

## 2019-03-09 NOTE — Discharge Instructions (Signed)
Rest.  Fluids.  Medications as needed.  Awaiting COVID test results - 24-48 hours.  Take care  Dr. Lacinda Axon

## 2019-03-10 ENCOUNTER — Encounter: Payer: Self-pay | Admitting: Physician Assistant

## 2019-03-10 LAB — NOVEL CORONAVIRUS, NAA (HOSP ORDER, SEND-OUT TO REF LAB; TAT 18-24 HRS): SARS-CoV-2, NAA: NOT DETECTED

## 2019-03-31 ENCOUNTER — Other Ambulatory Visit: Payer: Self-pay | Admitting: Family Medicine

## 2019-04-05 ENCOUNTER — Other Ambulatory Visit: Payer: Self-pay | Admitting: Family Medicine

## 2019-04-11 ENCOUNTER — Telehealth: Payer: Managed Care, Other (non HMO) | Admitting: Nurse Practitioner

## 2019-04-11 DIAGNOSIS — R079 Chest pain, unspecified: Secondary | ICD-10-CM

## 2019-04-11 NOTE — Progress Notes (Signed)
Based on what you shared with me it looks like you have chest pain,that should be evaluated in a face to face office visit. Even though it seems like costochondritis, you need to be evaluated to make sure your heart is okay before the costochondritis can be treated.    NOTE: If you entered your credit card information for this eVisit, you will not be charged. You may see a "hold" on your card for the $35 but that hold will drop off and you will not have a charge processed.  If you are having a true medical emergency please call 911.     For an urgent face to face visit, [facility] has four urgent care centers for your convenience:   . [facility]    [phone]                  Get Driving Directions  [address] [place], [place] [address] . 10 am to 8 pm Monday-Friday . 12 pm to 8 pm Saturday-Sunday   . [facility] at [facility]  [phone]                  Get Driving Directions  [address] [place], [place] [address] . 8 am to 8 pm Monday-Friday . 9 am to 6 pm Saturday . 11 am to 6 pm Sunday   . [facility] at [facility]  [phone]                  Get Driving Directions   [address] [place], [place] [address] . 8 am to 8 pm Monday-Friday . 8 am to 4 pm Saturday-Sunday    . [facility] at [facility] Directions  [phone]  [address], [address] [place], [place] [address]  . Monday-Friday, 12 PM to 6 PM    Your e-visit answers were reviewed by a board certified advanced clinical practitioner to complete your personal care plan.  Thank you for using e-Visits.

## 2019-04-12 ENCOUNTER — Encounter: Payer: Self-pay | Admitting: Physician Assistant

## 2019-04-13 ENCOUNTER — Encounter: Payer: Self-pay | Admitting: Physician Assistant

## 2019-04-13 ENCOUNTER — Ambulatory Visit (INDEPENDENT_AMBULATORY_CARE_PROVIDER_SITE_OTHER): Payer: Managed Care, Other (non HMO) | Admitting: Physician Assistant

## 2019-04-13 ENCOUNTER — Other Ambulatory Visit: Payer: Self-pay

## 2019-04-13 VITALS — BP 122/76 | HR 84 | Temp 96.3°F | Resp 16 | Wt 154.2 lb

## 2019-04-13 DIAGNOSIS — M940 Chondrocostal junction syndrome [Tietze]: Secondary | ICD-10-CM

## 2019-04-13 DIAGNOSIS — F419 Anxiety disorder, unspecified: Secondary | ICD-10-CM

## 2019-04-13 MED ORDER — MELOXICAM 15 MG PO TABS: 15.0000 mg | ORAL_TABLET | Freq: Every day | ORAL | 0 refills | Status: DC

## 2019-04-13 MED ORDER — SERTRALINE HCL 50 MG PO TABS: 50.0000 mg | ORAL_TABLET | Freq: Every day | ORAL | 0 refills | Status: DC

## 2019-04-13 MED ORDER — CYCLOBENZAPRINE HCL 10 MG PO TABS: 10.0000 mg | ORAL_TABLET | Freq: Three times a day (TID) | ORAL | 0 refills | Status: DC | PRN

## 2019-04-13 NOTE — Telephone Encounter (Signed)
Switched patient appointment from 04/17/2019 to 04/13/2019.

## 2019-04-13 NOTE — Patient Instructions (Signed)
Costochondritis Costochondritis is swelling and irritation (inflammation) of the tissue (cartilage) that connects your ribs to your breastbone (sternum). This causes pain in the front of your chest. Usually, the pain:  Starts gradually.  Is in more than one rib. This condition usually goes away on its own over time. Follow these instructions at home:  Do not do anything that makes your pain worse.  If directed, put ice on the painful area: ? Put ice in a plastic bag. ? Place a towel between your skin and the bag. ? Leave the ice on for 20 minutes, 2-3 times a day.  If directed, put heat on the affected area as often as told by your doctor. Use the heat source that your doctor tells you to use, such as a moist heat pack or a heating pad. ? Place a towel between your skin and the heat source. ? Leave the heat on for 20-30 minutes. ? Take off the heat if your skin turns bright red. This is very important if you cannot feel pain, heat, or cold. You may have a greater risk of getting burned.  Take over-the-counter and prescription medicines only as told by your doctor.  Return to your normal activities as told by your doctor. Ask your doctor what activities are safe for you.  Keep all follow-up visits as told by your doctor. This is important. Contact a doctor if:  You have chills or a fever.  Your pain does not go away or it gets worse.  You have a cough that does not go away. Get help right away if:  You are short of breath. This information is not intended to replace advice given to you by your health care provider. Make sure you discuss any questions you have with your health care provider. Document Revised: 03/20/2017 Document Reviewed: 06/29/2015 Elsevier Patient Education  2020 Elsevier Inc.  

## 2019-04-13 NOTE — Progress Notes (Signed)
Patient: Monica Long Female    DOB: [birth year]   49 y.o.   MRN: [id number] Visit Date: 04/13/2019  Today's Provider: Trinna Post, PA-C   Chief Complaint  Patient presents with  . Chest Pain   Subjective:     Chest Pain  This is a recurrent problem. The current episode started 1 to 4 weeks ago. The onset quality is sudden. The problem occurs daily. The problem has been gradually worsening. The pain is present in the epigastric region. The quality of the pain is described as pressure. The pain does not radiate. Associated symptoms include malaise/fatigue. Pertinent negatives include no abdominal pain, back pain, claudication, cough, diaphoresis, dizziness, exertional chest pressure, fever, headaches, hemoptysis, irregular heartbeat, leg pain, lower extremity edema, nausea, near-syncope, numbness, orthopnea, palpitations, PND, shortness of breath, sputum production, syncope, vomiting or weakness. The pain is aggravated by nothing.   Reports her anxiety has been increasing and cites work stressors that are contributing to her overall mood.  Allergies  Allergen Reactions  . Amoxicillin     Severe vomiting      Current Outpatient Medications:  .  cetirizine-pseudoephedrine (ZYRTEC-D) 5-120 MG tablet, Take 1 tablet by mouth 2 (two) times daily., Disp: 30 tablet, Rfl: 0 .  drospirenone-ethinyl estradiol (YAZ) 3-0.02 MG tablet, Take 1 tablet by mouth daily., Disp: 3 Package, Rfl: 3 .  ipratropium (ATROVENT) 0.06 % nasal spray, Place 2 sprays into both nostrils 4 (four) times daily as needed for rhinitis., Disp: 15 mL, Rfl: 0  Review of Systems  Constitutional: Positive for malaise/fatigue. Negative for diaphoresis and fever.  Respiratory: Negative for cough, hemoptysis, sputum production and shortness of breath.   Cardiovascular: Positive for chest pain. Negative for palpitations, orthopnea, claudication, syncope, PND and near-syncope.  Gastrointestinal: Negative for  abdominal pain, nausea and vomiting.  Musculoskeletal: Negative for back pain.  Neurological: Negative for dizziness, weakness, numbness and headaches.    Social History   Tobacco Use  . Smoking status: Never Smoker  . Smokeless tobacco: Never Used  Substance Use Topics  . Alcohol use: No    Alcohol/week: 0.0 standard drinks      Objective:   There were no vitals taken for this visit. There were no vitals filed for this visit.There is no height or weight on file to calculate BMI.   Physical Exam Constitutional:      Appearance: She is well-developed.  Cardiovascular:     Rate and Rhythm: Normal rate and regular rhythm.     Heart sounds: Normal heart sounds.  Pulmonary:     Effort: Pulmonary effort is normal.  Chest:     Chest wall: Tenderness present.  Skin:    General: Skin is warm and dry.  Neurological:     Mental Status: She is alert.  Psychiatric:        Mood and Affect: Mood normal.        Behavior: Behavior normal.      No results found for any visits on 04/13/19.     Assessment & Plan    1. Costochondritis  - cyclobenzaprine (FLEXERIL) 10 MG tablet; Take 1 tablet (10 mg total) by mouth 3 (three) times daily as needed for muscle spasms.  Dispense: 30 tablet; Refill: 0 - meloxicam (MOBIC) 15 MG tablet; Take 1 tablet (15 mg total) by mouth daily.  Dispense: 30 tablet; Refill: 0  2. Anxiety  Restart as below.  - sertraline (ZOLOFT) 50 MG tablet; Take 1 tablet (  50 mg total) by mouth daily.  Dispense: 30 tablet; Refill: 0 - sertraline (ZOLOFT) 50 MG tablet; Take 1 tablet (50 mg total) by mouth daily.  Dispense: 90 tablet; Refill: 0  The entirety of the information documented in the History of Present Illness, Review of Systems and Physical Exam were personally obtained by me. Portions of this information were initially documented by Sheliah Hatch, CMA and reviewed by me for thoroughness and accuracy.         Trey Sailors, PA-C  [facility]

## 2019-04-14 ENCOUNTER — Encounter: Payer: Self-pay | Admitting: Physician Assistant

## 2019-04-17 ENCOUNTER — Ambulatory Visit: Payer: Managed Care, Other (non HMO) | Admitting: Physician Assistant

## 2019-04-20 ENCOUNTER — Encounter: Payer: Self-pay | Admitting: Physician Assistant

## 2019-04-21 ENCOUNTER — Encounter: Payer: Self-pay | Admitting: Physician Assistant

## 2019-04-28 ENCOUNTER — Encounter: Payer: Self-pay | Admitting: Physician Assistant

## 2019-04-29 ENCOUNTER — Encounter (INDEPENDENT_AMBULATORY_CARE_PROVIDER_SITE_OTHER): Payer: Managed Care, Other (non HMO) | Admitting: Physician Assistant

## 2019-04-29 DIAGNOSIS — F329 Major depressive disorder, single episode, unspecified: Secondary | ICD-10-CM

## 2019-04-29 DIAGNOSIS — F419 Anxiety disorder, unspecified: Secondary | ICD-10-CM | POA: Diagnosis not present

## 2019-04-29 DIAGNOSIS — F32A Depression, unspecified: Secondary | ICD-10-CM

## 2019-04-30 MED ORDER — BUPROPION HCL ER (SR) 150 MG PO TB12: ORAL_TABLET | ORAL | 1 refills | Status: DC

## 2019-04-30 NOTE — Telephone Encounter (Signed)
   Subjective:   Patient ID: Monica Long, female    DOB: [birth year], 49 y.o.   MRN: [id number]  Monica Long is a 49 y.o. female presenting on 04/29/2019 for depression.  Virtual Visit via Telephone Note  I connected with Malea Swilling Swarm on 04/30/2019 at 11:20 AM by telephone and verified that I am speaking with the correct person using two identifiers.   I discussed the limitations, risks, security and privacy concerns of performing an evaluation and management service by telephone and the availability of in person appointments. I also discussed with the patient that there may be a patient responsible charge related to this service. The patient expressed understanding and agreed to proceed.   HPI  Patient presents today for follow up of depression and anxiety. She was started on zoloft 50 mg QD one month ago but reports intolerable headaches for the duration of treatment.   Social History   Tobacco Use  . Smoking status: Never Smoker  . Smokeless tobacco: Never Used  Substance Use Topics  . Alcohol use: No    Alcohol/week: 0.0 standard drinks  . Drug use: No    ROS Per HPI unless specifically indicated above   Objective:   Wt Readings from Last 3 Encounters:  04/13/19 154 lb 3.2 oz (69.9 kg)  03/09/19 150 lb (68 kg)  05/26/18 155 lb (70.3 kg)    Physical Exam Results for orders placed or performed during the hospital encounter of 03/09/19  Novel Coronavirus, NAA (Hosp order, Send-out to Ref Lab; TAT 18-24 hrs   Specimen: Nasopharyngeal Swab; Respiratory  Result Value Ref Range   SARS-CoV-2, NAA NOT DETECTED NOT DETECTED   Coronavirus Source NASOPHARYNGEAL     Assessment & Plan:   1. Anxiety and depression  Change as below.  - buPROPion (WELLBUTRIN SR) 150 MG 12 hr tablet; 1 tablet once daily for three days. Then 1 tablet twice daily onward.  Dispense: 180 tablet; Refill: 1'  F/u 6 months  Osvaldo Angst, PA-C [facility] 04/30/2019, 11:37 AM

## 2019-05-07 ENCOUNTER — Other Ambulatory Visit: Payer: Self-pay | Admitting: Physician Assistant

## 2019-05-07 DIAGNOSIS — F419 Anxiety disorder, unspecified: Secondary | ICD-10-CM

## 2019-05-07 NOTE — Telephone Encounter (Signed)
Requested Prescriptions  Pending Prescriptions Disp Refills  . sertraline (ZOLOFT) 50 MG tablet [Pharmacy Med Name: SERTRALINE HCL 50 MG TABLET] 60 tablet 1    Sig: TAKE 1 TABLET BY MOUTH EVERY DAY     Psychiatry:  Antidepressants - SSRI Passed - 05/07/2019  2:31 PM      Passed - Valid encounter within last 6 months    Recent Outpatient Visits          3 weeks ago Costochondritis   [facility] [place], [place], [place]   1 year ago Acute rhinitis   [facility] [place], [place], [place]   1 year ago Annual physical exam   [facility] [place], [place]   1 year ago Varicose veins of leg with pain, bilateral   [facility] [place], [place], [place]   1 year ago Lipid screening   [facility] [place], [place]

## 2019-06-14 ENCOUNTER — Other Ambulatory Visit: Payer: Self-pay | Admitting: Physician Assistant

## 2019-06-14 DIAGNOSIS — M7989 Other specified soft tissue disorders: Secondary | ICD-10-CM

## 2019-06-14 DIAGNOSIS — R519 Headache, unspecified: Secondary | ICD-10-CM

## 2019-06-15 NOTE — Telephone Encounter (Signed)
Requested medication (s) are due for refill today: yes  Requested medication (s) are on the active medication list: no  Last refill: 04/30/19  Future visit scheduled: no  Notes to clinic:  not on active med list    Requested Prescriptions  Pending Prescriptions Disp Refills   NIKKI 3-0.02 MG tablet [Pharmacy Med Name: NIKKI TAB 3-0.02MG ] 84 tablet 3    Sig: TAKE 1 TABLET BY MOUTH  DAILY      OB/GYN:  Contraceptives Passed - 06/14/2019  9:31 PM      Passed - Last BP in normal range    BP Readings from Last 1 Encounters:  04/13/19 122/76          Passed - Valid encounter within last 12 months    Recent Outpatient Visits           2 months ago Costochondritis   [facility] [place], [place], [place]   1 year ago Acute rhinitis   [facility] [place], [place], [place]   1 year ago Annual physical exam   [facility] [place], [place]   1 year ago Varicose veins of leg with pain, bilateral   [facility] [place], [place], [place]   1 year ago Lipid screening   [facility] [place], [place]

## 2019-06-16 NOTE — Telephone Encounter (Signed)
Tired calling patient and no answer left voicemail for patient to call the office back to schedule appointment. If patient calls back ok for [facility] to schedule a physical for patient.

## 2019-06-16 NOTE — Telephone Encounter (Signed)
Patient is due for physical, please schedule. Thank you.

## 2019-07-01 ENCOUNTER — Other Ambulatory Visit: Payer: Self-pay

## 2019-07-01 ENCOUNTER — Ambulatory Visit: Payer: Managed Care, Other (non HMO) | Attending: Internal Medicine

## 2019-07-01 DIAGNOSIS — Z23 Encounter for immunization: Secondary | ICD-10-CM

## 2019-07-01 NOTE — Progress Notes (Signed)
   Covid-19 Vaccination Clinic  Name:  Monica Long    MRN: [id number] DOB: 1970/12/12  07/01/2019  Monica Long was observed post Covid-19 immunization for 15 minutes without incident. She was provided with Vaccine Information Sheet and instruction to access the V-Safe system.   Monica Long was instructed to call 911 with any severe reactions post vaccine: Marland Kitchen Difficulty breathing  . Swelling of face and throat  . A fast heartbeat  . A bad rash all over body  . Dizziness and weakness   Immunizations Administered    Name Date Dose VIS Date Route   Pfizer COVID-19 Vaccine 07/01/2019  8:17 AM 0.3 mL 02/27/2019 Intramuscular   Manufacturer: [employer], [employer]   Lot: [id number]   NDC: [address]-1

## 2019-07-03 ENCOUNTER — Other Ambulatory Visit: Payer: Self-pay

## 2019-07-03 ENCOUNTER — Encounter: Payer: Self-pay | Admitting: Physician Assistant

## 2019-07-03 ENCOUNTER — Ambulatory Visit (INDEPENDENT_AMBULATORY_CARE_PROVIDER_SITE_OTHER): Payer: Managed Care, Other (non HMO) | Admitting: Physician Assistant

## 2019-07-03 ENCOUNTER — Ambulatory Visit: Payer: Self-pay | Admitting: Physician Assistant

## 2019-07-03 VITALS — BP 114/75 | HR 79 | Temp 96.9°F | Resp 16 | Ht 67.0 in | Wt 154.2 lb

## 2019-07-03 DIAGNOSIS — R1011 Right upper quadrant pain: Secondary | ICD-10-CM

## 2019-07-03 NOTE — Telephone Encounter (Signed)
Pt reports right sided lower abdominal pain, onset 1 week ago, worsening yesterday. Also reports nausea, no vomiting, no diarrhea. Pain is intermittent 3/10, worsens with palpation.Denies fever, no dysuria, pain does not radiate to flank nor lower back.. States pain gradual onset 1 week ago, did not reoccur until yesterday. 1720 appt was made by agent prior to triage.  Care advise given, advised ED if symptoms  worsen.  Reason for Disposition . [1] MILD pain (e.g., does not interfere with normal activities) AND [2] pain comes and goes (cramps) AND [3] present > 48 hours  Answer Assessment - Initial Assessment Questions 1. LOCATION: "Where does it hurt?"      Lower right abdomen 2. RADIATION: "Does the pain shoot anywhere else?" (e.g., chest, back)     no 3. ONSET: "When did the pain begin?" (e.g., minutes, hours or days ago)      1 week ago 4. SUDDEN: "Gradual or sudden onset?"     Gradually 5. PATTERN "Does the pain come and go, or is it constant?"    - If constant: "Is it getting better, staying the same, or worsening?"      (Note: Constant means the pain never goes away completely; most serious pain is constant and it progresses)     - If intermittent: "How long does it last?" "Do you have pain now?"     (Note: Intermittent means the pain goes away completely between bouts)     Worsening yesterday, intermittent 6. SEVERITY: "How bad is the pain?"  (e.g., Scale 1-10; mild, moderate, or severe)   - MILD (1-3): doesn't interfere with normal activities, abdomen soft and not tender to touch    - MODERATE (4-7): interferes with normal activities or awakens from sleep, tender to touch    - SEVERE (8-10): excruciating pain, doubled over, unable to do any normal activities      3/10, worse with pressure 7. RECURRENT SYMPTOM: "Have you ever had this type of abdominal pain before?" If so, ask: "When was the last time?" and "What happened that time?"      no 8. CAUSE: "What do you think is causing  the abdominal pain?"     Unsure 9. RELIEVING/AGGRAVATING FACTORS: "What makes it better or worse?" (e.g., movement, antacids, bowel movement)     nothing 10. OTHER SYMPTOMS: "Has there been any vomiting, diarrhea, constipation, or urine problems?"       Nausea constant onset yesterday  Protocols used: ABDOMINAL PAIN - [facility]

## 2019-07-03 NOTE — Patient Instructions (Addendum)
Gallbladder Eating Plan If you have a gallbladder condition, you may have trouble digesting fats. Eating a low-fat diet can help reduce your symptoms, and may be helpful before and after having surgery to remove your gallbladder (cholecystectomy). Your health care provider may recommend that you work with a diet and nutrition specialist (dietitian) to help you reduce the amount of fat in your diet. What are tips for following this plan? General guidelines  Limit your fat intake to less than 30% of your total daily calories. If you eat around 1,800 calories each day, this is less than 60 grams (g) of fat per day.  Fat is an important part of a healthy diet. Eating a low-fat diet can make it hard to maintain a healthy body weight. Ask your dietitian how much fat, calories, and other nutrients you need each day.  Eat small, frequent meals throughout the day instead of three large meals.  Drink at least 8-10 cups of fluid a day. Drink enough fluid to keep your urine clear or pale yellow.  Limit alcohol intake to no more than 1 drink a day for nonpregnant women and 2 drinks a day for men. One drink equals 12 oz of beer, 5 oz of wine, or 1 oz of hard liquor. Reading food labels  Check Nutrition Facts on food labels for the amount of fat per serving. Choose foods with less than 3 grams of fat per serving. Shopping  Choose nonfat and low-fat healthy foods. Look for the words "nonfat," "low fat," or "fat free."  Avoid buying processed or prepackaged foods. Cooking  Cook using low-fat methods, such as baking, broiling, grilling, or boiling.  Cook with small amounts of healthy fats, such as olive oil, grapeseed oil, canola oil, or sunflower oil. What foods are recommended?   All fresh, frozen, or canned fruits and vegetables.  Whole grains.  Low-fat or non-fat (skim) milk and yogurt.  Lean meat, skinless poultry, fish, eggs, and beans.  Low-fat protein supplement powders or  drinks.  Spices and herbs. What foods are not recommended?  High-fat foods. These include baked goods, fast food, fatty cuts of meat, ice cream, french toast, sweet rolls, pizza, cheese bread, foods covered with butter, creamy sauces, or cheese.  Fried foods. These include french fries, tempura, battered fish, breaded chicken, fried breads, and sweets.  Foods with strong odors.  Foods that cause bloating and gas. Summary  A low-fat diet can be helpful if you have a gallbladder condition, or before and after gallbladder surgery.  Limit your fat intake to less than 30% of your total daily calories. This is about 60 g of fat if you eat 1,800 calories each day.  Eat small, frequent meals throughout the day instead of three large meals. This information is not intended to replace advice given to you by your health care provider. Make sure you discuss any questions you have with your health care provider. Document Revised: 06/26/2018 Document Reviewed: 04/12/2016 Elsevier Patient Education  2020 Elsevier Inc.   Cholelithiasis  Cholelithiasis is a form of gallbladder disease in which gallstones form in the gallbladder. The gallbladder is an organ that stores bile. Bile is made in the liver, and it helps to digest fats. Gallstones begin as small crystals and slowly grow into stones. They may cause no symptoms until the gallbladder tightens (contracts) and a gallstone is blocking the duct (gallbladder attack), which can cause pain. Cholelithiasis is also referred to as gallstones. There are two main types of gallstones:    Cholesterol stones. These are made of hardened cholesterol and are usually yellow-green in color. They are the most common type of gallstone. Cholesterol is a white, waxy, fat-like substance that is made in the liver.  Pigment stones. These are dark in color and are made of a red-yellow substance that forms when hemoglobin from red blood cells breaks down (bilirubin). What  are the causes? This condition may be caused by an imbalance in the substances that bile is made of. This can happen if the bile:  Has too much bilirubin.  Has too much cholesterol.  Does not have enough bile salts. These salts help the body absorb and digest fats. In some cases, this condition can also be caused by the gallbladder not emptying completely or often enough. What increases the risk? The following factors may make you more likely to develop this condition:  Being female.  Having multiple pregnancies. Health care providers sometimes advise removing diseased gallbladders before future pregnancies.  Eating a diet that is heavy in fried foods, fat, and refined carbohydrates, like white bread and white rice.  Being obese.  Being older than age 40.  Prolonged use of medicines that contain female hormones (estrogen).  Having diabetes mellitus.  Rapidly losing weight.  Having a family history of gallstones.  Being of American Indian or Mexican descent.  Having an intestinal disease such as Crohn disease.  Having metabolic syndrome.  Having cirrhosis.  Having severe types of anemia such as sickle cell anemia. What are the signs or symptoms? In most cases, there are no symptoms. These are known as silent gallstones. If a gallstone blocks the bile ducts, it can cause a gallbladder attack. The main symptom of a gallbladder attack is sudden pain in the upper right abdomen. The pain usually comes at night or after eating a large meal. The pain can last for one or several hours and can spread to the right shoulder or chest. If the bile duct is blocked for more than a few hours, it can cause infection or inflammation of the gallbladder, liver, or pancreas, which may cause:  Nausea.  Vomiting.  Abdominal pain that lasts for 5 hours or more.  Fever or chills.  Yellowing of the skin or the whites of the eyes (jaundice).  Dark urine.  Light-colored stools. How is this  diagnosed? This condition may be diagnosed based on:  A physical exam.  Your medical history.  An ultrasound of your gallbladder.  CT scan.  MRI.  Blood tests to check for signs of infection or inflammation.  A scan of your gallbladder and bile ducts (biliary system) using nonharmful radioactive material and special cameras that can see the radioactive material (cholescintigram). This test checks to see how your gallbladder contracts and whether bile ducts are blocked.  Inserting a small tube with a camera on the end (endoscope) through your mouth to inspect bile ducts and check for blockages (endoscopic retrograde cholangiopancreatogram). How is this treated? Treatment for gallstones depends on the severity of the condition. Silent gallstones do not need treatment. If the gallstones cause a gallbladder attack or other symptoms, treatment may be required. Options for treatment include:  Surgery to remove the gallbladder (cholecystectomy). This is the most common treatment.  Medicines to dissolve gallstones. These are most effective at treating small gallstones. You may need to take medicines for up to 6-12 months.  Shock wave treatment (extracorporeal biliary lithotripsy). In this treatment, an ultrasound machine sends shock waves to the gallbladder to break gallstones   into smaller pieces. These pieces can then be passed into the intestines or be dissolved by medicine. This is rarely used.  Removing gallstones through endoscopic retrograde cholangiopancreatogram. A small basket can be attached to the endoscope and used to capture and remove gallstones. Follow these instructions at home:  Take over-the-counter and prescription medicines only as told by your health care provider.  Maintain a healthy weight and follow a healthy diet. This includes: ? Reducing fatty foods, such as fried food. ? Reducing refined carbohydrates, like white bread and white rice. ? Increasing fiber. Aim for  foods like almonds, fruit, and beans.  Keep all follow-up visits as told by your health care provider. This is important. Contact a health care provider if:  You think you have had a gallbladder attack.  You have been diagnosed with silent gallstones and you develop abdominal pain or indigestion. Get help right away if:  You have pain from a gallbladder attack that lasts for more than 2 hours.  You have abdominal pain that lasts for more than 5 hours.  You have a fever or chills.  You have persistent nausea and vomiting.  You develop jaundice.  You have dark urine or light-colored stools. Summary  Cholelithiasis (also called gallstones) is a form of gallbladder disease in which gallstones form in the gallbladder.  This condition is caused by an imbalance in the substances that make up bile. This can happen if the bile has too much cholesterol, too much bilirubin, or not enough bile salts.  You are more likely to develop this condition if you are female, pregnant, using medicines with estrogen, obese, older than age 40, or have a family history of gallstones. You may also develop gallstones if you have diabetes, an intestinal disease, cirrhosis, or metabolic syndrome.  Treatment for gallstones depends on the severity of the condition. Silent gallstones do not need treatment.  If gallstones cause a gallbladder attack or other symptoms, treatment may be needed. The most common treatment is surgery to remove the gallbladder. This information is not intended to replace advice given to you by your health care provider. Make sure you discuss any questions you have with your health care provider. Document Revised: 02/15/2017 Document Reviewed: 11/20/2015 Elsevier Patient Education  2020 Elsevier Inc.  

## 2019-07-03 NOTE — Progress Notes (Signed)
Established patient visit     Patient: Monica Long   DOB: December 13, 1970   49 y.o. Female  MRN: [id number] Visit Date: 07/03/2019  Today's healthcare provider: Margaretann Loveless, PA-C  Subjective:    Chief Complaint  Patient presents with  . Abdominal Pain   Abdominal Pain This is a new problem. The current episode started yesterday (Reports that she had some pain in the same place the begining of last week and it got beter but last nigth it came back and it was  more painful.). The onset quality is sudden. The problem occurs constantly. The problem has been unchanged. The pain is located in the RLQ. The pain is mild. Quality: "nagging" or "pressure" The abdominal pain does not radiate. Associated symptoms include nausea. Pertinent negatives include no constipation, diarrhea, dysuria, hematuria or vomiting. Nothing aggravates the pain. The pain is relieved by nothing. She has tried acetaminophen for the symptoms. The treatment provided no relief.    Patient Active Problem List   Diagnosis Date Noted  . Varicose veins of leg with pain, bilateral 06/19/2016  . Swelling of limb 06/19/2016   Past Medical History:  Diagnosis Date  . Anxiety    panic attack  . Depression 02/08/2014  . History of mammogram 01/07/2014; 05-09-15   birad 2; neg  . History of Papanicolaou smear of cervix 01/07/2014   -/-  . Hypercholesteremia   . Hypertriglyceridemia   . Kidney stone 02/2015  . Peripheral vascular disease (HCC)    Allergies  Allergen Reactions  . Amoxicillin     Severe vomiting        Medications: Outpatient Medications Prior to Visit  Medication Sig  . NIKKI 3-0.02 MG tablet TAKE 1 TABLET BY MOUTH  DAILY  . buPROPion (WELLBUTRIN SR) 150 MG 12 hr tablet 1 tablet once daily for three days. Then 1 tablet twice daily onward.  . cetirizine-pseudoephedrine (ZYRTEC-D) 5-120 MG tablet Take 1 tablet by mouth 2 (two) times daily.  . cyclobenzaprine (FLEXERIL) 10 MG tablet Take 1  tablet (10 mg total) by mouth 3 (three) times daily as needed for muscle spasms.  Marland Kitchen ipratropium (ATROVENT) 0.06 % nasal spray Place 2 sprays into both nostrils 4 (four) times daily as needed for rhinitis.  . meloxicam (MOBIC) 15 MG tablet Take 1 tablet (15 mg total) by mouth daily.  . sertraline (ZOLOFT) 50 MG tablet Take 1 tablet (50 mg total) by mouth daily.  . sertraline (ZOLOFT) 50 MG tablet TAKE 1 TABLET BY MOUTH EVERY DAY   No facility-administered medications prior to visit.    Review of Systems  Gastrointestinal: Positive for abdominal pain and nausea. Negative for constipation, diarrhea and vomiting.  Genitourinary: Negative for dysuria and hematuria.    Last CBC Lab Results  Component Value Date   WBC 9.2 08/16/2017   HGB 13.2 08/16/2017   HCT 37.8 08/16/2017   MCV 83 08/16/2017   MCH 28.9 08/16/2017   RDW 14.4 08/16/2017   PLT 291 08/16/2017   Last metabolic panel Lab Results  Component Value Date   GLUCOSE 83 08/16/2017   NA 140 08/16/2017   K 4.4 08/16/2017   CL 98 08/16/2017   CO2 26 08/16/2017   BUN 16 08/16/2017   CREATININE 0.74 08/16/2017   GFRNONAA 97 08/16/2017   GFRAA 112 08/16/2017   CALCIUM 10.3 (H) 08/16/2017   PROT 7.5 08/16/2017   ALBUMIN 4.5 08/16/2017   LABGLOB 3.0 08/16/2017   AGRATIO 1.5 08/16/2017  BILITOT 0.3 08/16/2017   ALKPHOS 65 08/16/2017   AST 27 08/16/2017   ALT 32 08/16/2017   ANIONGAP 10 02/21/2015   Last lipids Lab Results  Component Value Date   CHOL 232 (H) 08/16/2017   HDL 40 08/16/2017   LDLCALC 115 (H) 08/16/2017   TRIG 387 (H) 08/16/2017   CHOLHDL 5.8 (H) 08/16/2017   Last hemoglobin A1c Lab Results  Component Value Date   HGBA1C 5.0 08/16/2017   Last thyroid functions Lab Results  Component Value Date   TSH 3.150 08/16/2017        Objective:    BP 114/75 (BP Location: Left Arm, Patient Position: Sitting, Cuff Size: Large)   Pulse 79   Temp (!) 96.9 F (36.1 C) (Temporal)   Resp 16   Ht 5\' 7"   (1.702 m)   Wt 154 lb 3.2 oz (69.9 kg)   BMI 24.15 kg/m  BP Readings from Last 3 Encounters:  07/03/19 114/75  04/13/19 122/76  03/09/19 (!) 143/72   Wt Readings from Last 3 Encounters:  07/03/19 154 lb 3.2 oz (69.9 kg)  04/13/19 154 lb 3.2 oz (69.9 kg)  03/09/19 150 lb (68 kg)      Physical Exam Constitutional:      General: She is not in acute distress.    Appearance: Normal appearance. She is well-developed and normal weight. She is not ill-appearing or diaphoretic.  Cardiovascular:     Rate and Rhythm: Normal rate and regular rhythm.     Heart sounds: Normal heart sounds. No murmur. No friction rub. No gallop.   Pulmonary:     Effort: Pulmonary effort is normal. No respiratory distress.     Breath sounds: Normal breath sounds. No wheezing or rales.  Abdominal:     General: Bowel sounds are normal. There is no distension.     Palpations: Abdomen is soft. There is no mass.     Tenderness: There is abdominal tenderness in the right upper quadrant. There is guarding. There is no right CVA tenderness, left CVA tenderness or rebound. Negative signs include Murphy's sign, Rovsing's sign, McBurney's sign and obturator sign.  Skin:    General: Skin is warm and dry.  Neurological:     Mental Status: She is alert and oriented to person, place, and time.       No results found for any visits on 07/03/19.    Assessment & Plan:    1. Colicky RUQ abdominal pain Will get [place] and labs to r/o cholecystitis or cholelithiasis. Will f/u pending results. Diet for gallbladder disease will be printed on AVS.  - US Abdomen Limited RUQ; Future    No follow-ups on file.     Reynolds Bowl, PA-C, have reviewed all documentation for this visit. The documentation on 07/03/19 for the exam, diagnosis, procedures, and orders are all accurate and complete.   Rubye Beach  [facility] [phone] (phone) [phone] (fax)  [place]

## 2019-07-04 LAB — CBC WITH DIFFERENTIAL/PLATELET
Basophils Absolute: 0.1 10*3/uL (ref 0.0–0.2)
Basos: 1 %
EOS (ABSOLUTE): 0.1 10*3/uL (ref 0.0–0.4)
Eos: 2 %
Hematocrit: 38.7 % (ref 34.0–46.6)
Hemoglobin: 13.3 g/dL (ref 11.1–15.9)
Immature Grans (Abs): 0 10*3/uL (ref 0.0–0.1)
Immature Granulocytes: 0 %
Lymphocytes Absolute: 3.5 10*3/uL — ABNORMAL HIGH (ref 0.7–3.1)
Lymphs: 39 %
MCH: 30 pg (ref 26.6–33.0)
MCHC: 34.4 g/dL (ref 31.5–35.7)
MCV: 87 fL (ref 79–97)
Monocytes Absolute: 0.6 10*3/uL (ref 0.1–0.9)
Monocytes: 7 %
Neutrophils Absolute: 4.6 10*3/uL (ref 1.4–7.0)
Neutrophils: 51 %
Platelets: 267 10*3/uL (ref 150–450)
RBC: 4.44 x10E6/uL (ref 3.77–5.28)
RDW: 13.3 % (ref 11.7–15.4)
WBC: 8.9 10*3/uL (ref 3.4–10.8)

## 2019-07-04 LAB — COMPREHENSIVE METABOLIC PANEL
ALT: 12 IU/L (ref 0–32)
AST: 12 IU/L (ref 0–40)
Albumin/Globulin Ratio: 1.6 (ref 1.2–2.2)
Albumin: 4.5 g/dL (ref 3.8–4.8)
Alkaline Phosphatase: 67 IU/L (ref 39–117)
BUN/Creatinine Ratio: 21 (ref 9–23)
BUN: 16 mg/dL (ref 6–24)
Bilirubin Total: <0.2 mg/dL (ref 0.0–1.2)
CO2: 21 mmol/L (ref 20–29)
Calcium: 9.8 mg/dL (ref 8.7–10.2)
Chloride: 100 mmol/L (ref 96–106)
Creatinine, Ser: 0.76 mg/dL (ref 0.57–1.00)
GFR calc Af Amer: 107 mL/min/{1.73_m2} (ref >59)
GFR calc non Af Amer: 93 mL/min/{1.73_m2} (ref >59)
Globulin, Total: 2.8 g/dL (ref 1.5–4.5)
Glucose: 85 mg/dL (ref 65–99)
Potassium: 4.4 mmol/L (ref 3.5–5.2)
Sodium: 139 mmol/L (ref 134–144)
Total Protein: 7.3 g/dL (ref 6.0–8.5)

## 2019-07-04 LAB — LIPASE: Lipase: 44 U/L (ref 14–72)

## 2019-07-06 ENCOUNTER — Ambulatory Visit
Admission: RE | Admit: 2019-07-06 | Discharge: 2019-07-06 | Disposition: A | Discharge status: Home / Self Care | Payer: Managed Care, Other (non HMO) | Source: Ambulatory Visit | Attending: Physician Assistant | Admitting: Physician Assistant

## 2019-07-06 ENCOUNTER — Other Ambulatory Visit: Payer: Self-pay

## 2019-07-06 ENCOUNTER — Telehealth: Payer: Self-pay

## 2019-07-06 DIAGNOSIS — R1011 Right upper quadrant pain: Secondary | ICD-10-CM | POA: Insufficient documentation

## 2019-07-06 DIAGNOSIS — K2960 Other gastritis without bleeding: Secondary | ICD-10-CM

## 2019-07-06 NOTE — Telephone Encounter (Signed)
-----   Message from Margaretann Loveless, PA-C sent at 07/06/2019  8:23 AM EDT ----- All labs are unremarkabkle. Blood count is normal and stable. Kidney and liver function is normal. Sodium, potassium, and calcium is normal. Sugar is normal. Lipase is normal. Will await [place] results.

## 2019-07-06 NOTE — Telephone Encounter (Signed)
-----   Message from Margaretann Loveless, PA-C sent at 07/06/2019 11:03 AM EDT ----- No gall stones or infection around the gallbladder noted. There is some mild fatty liver disease noted of the liver. There is not much we do about that at this point, except encourage low fat diets. How is your stomach pain?

## 2019-07-06 NOTE — Telephone Encounter (Signed)
LMTCB or to view results in my chart and providers message.

## 2019-07-07 NOTE — Telephone Encounter (Signed)
Patient advised as directed below. She is asking if the fatty liver can be the cause of her pain? She still has some discomfort off and on more to the right side now.

## 2019-07-08 ENCOUNTER — Encounter: Payer: Self-pay | Admitting: Physician Assistant

## 2019-07-08 MED ORDER — OMEPRAZOLE 40 MG PO CPDR: 40.0000 mg | DELAYED_RELEASE_CAPSULE | Freq: Every day | ORAL | 0 refills | Status: DC

## 2019-07-08 NOTE — Telephone Encounter (Signed)
Fatty liver is not normally painful.   It is possible the chronic use of Meloxicam may be causing irritation of her stomach. This could cause similar symptoms.   I would recommend to stop meloxicam use. Try using only tylenol for right now and I will send in omeprazole to see if that starts helping the pain. Try omeprazole and stopping meloxicam x 14 days. Call if pain worsens in the meantime or if not improved after the 14 day treatment.

## 2019-07-08 NOTE — Telephone Encounter (Signed)
LMTCB 07/08/2019.  PEC please advise pt of the message below.   Thanks,   -Vernona Rieger

## 2019-07-09 ENCOUNTER — Encounter: Payer: Self-pay | Admitting: Physician Assistant

## 2019-07-09 ENCOUNTER — Encounter: Payer: Self-pay | Admitting: Emergency Medicine

## 2019-07-09 ENCOUNTER — Other Ambulatory Visit: Payer: Self-pay

## 2019-07-09 ENCOUNTER — Ambulatory Visit
Admission: EM | Admit: 2019-07-09 | Discharge: 2019-07-09 | Disposition: A | Discharge status: Home / Self Care | Payer: Managed Care, Other (non HMO) | Attending: Family Medicine | Admitting: Family Medicine

## 2019-07-09 ENCOUNTER — Ambulatory Visit (INDEPENDENT_AMBULATORY_CARE_PROVIDER_SITE_OTHER): Payer: Managed Care, Other (non HMO)

## 2019-07-09 DIAGNOSIS — R109 Unspecified abdominal pain: Secondary | ICD-10-CM | POA: Diagnosis present

## 2019-07-09 DIAGNOSIS — R911 Solitary pulmonary nodule: Secondary | ICD-10-CM | POA: Insufficient documentation

## 2019-07-09 DIAGNOSIS — R932 Abnormal findings on diagnostic imaging of liver and biliary tract: Secondary | ICD-10-CM

## 2019-07-09 HISTORY — DX: Fatty (change of) liver, not elsewhere classified: K76.0

## 2019-07-09 LAB — URINALYSIS, COMPLETE (UACMP) WITH MICROSCOPIC
Bilirubin Urine: NEGATIVE
Glucose, UA: NEGATIVE mg/dL
Hgb urine dipstick: NEGATIVE
Ketones, ur: NEGATIVE mg/dL
Leukocytes,Ua: NEGATIVE
Nitrite: NEGATIVE
Protein, ur: NEGATIVE mg/dL
RBC / HPF: NONE SEEN RBC/hpf (ref 0–5)
Specific Gravity, Urine: 1.025 (ref 1.005–1.030)
pH: 5.5 (ref 5.0–8.0)

## 2019-07-09 MED ORDER — ONDANSETRON 4 MG PO TBDP: 4.0000 mg | ORAL_TABLET | Freq: Three times a day (TID) | ORAL | 0 refills | Status: DC | PRN

## 2019-07-09 MED ORDER — HYDROCODONE-ACETAMINOPHEN 5-325 MG PO TABS: 1.0000 | ORAL_TABLET | Freq: Three times a day (TID) | ORAL | 0 refills | Status: DC | PRN

## 2019-07-09 NOTE — ED Provider Notes (Signed)
[place], [place]   Name: Monica Long DOB: 02-May-1970 MRN: [id number] CSN: [id number] PCP: Trey Sailors, PA-C  Arrival date and time:  07/09/19 1154  Chief Complaint:  Abdominal Pain (RLQ)  NOTE: Prior to seeing the patient today, I have reviewed the triage nursing documentation and vital signs. Clinical staff has updated patient's PMH/PSHx, current medication list, and drug allergies/intolerances to ensure comprehensive history available to assist in medical decision making.   History:   HPI: Monica Long is a 49 y.o. female who presents today with complaints of pain in the RIGHT side of her lower back with (+) radiation into her flank area that started approximately 3 weeks ago. Patient has been seen recently by her PCP who ordered labs (CBC, CMP, lipase) and a RUQ ultrasound. Results from this testing and provider notes reviewed. Workup to date has been unremarkable. Patient presents today with an acute exacerbation of her pain. She describes the pain as being sharp and stabbing; pain rated 6/10 at present. Patient is experiencing nausea without any episodes of associated vomiting. She denies any changes to her bowel habits. No fevers, chills, or urinary symptoms. Patient is eating and drinking normally. PMH (+) for urolithiasis, which due to the location of her pain, is one of the patient's main concerns today. In efforts to conservatively manage her symptoms at home, the patient notes that she has used APAP and IBU, which has not helped to improve her symptoms.   Past Medical History:  Diagnosis Date  . Anxiety    panic attack  . Depression 02/08/2014  . Hepatic steatosis   . History of mammogram 01/07/2014; 05-09-15   birad 2; neg  . History of Papanicolaou smear of cervix 01/07/2014   -/-  . Hypercholesteremia   . Hypertriglyceridemia   . Kidney stone 02/2015  . Peripheral vascular disease [facility])     Past Surgical History:  Procedure Laterality Date  . CESAREAN  SECTION  05/08/2001   x1    Family History  Problem Relation Age of Onset  . Healthy Mother   . Hypertension Father   . Colon cancer Maternal Grandmother 108    Social History   Tobacco Use  . Smoking status: Never Smoker  . Smokeless tobacco: Never Used  Substance Use Topics  . Alcohol use: No    Alcohol/week: 0.0 standard drinks  . Drug use: No    Patient Active Problem List   Diagnosis Date Noted  . Varicose veins of leg with pain, bilateral 06/19/2016  . Swelling of limb 06/19/2016    Home Medications:    Current Meds  Medication Sig  . ipratropium (ATROVENT) 0.06 % nasal spray Place 2 sprays into both nostrils 4 (four) times daily as needed for rhinitis.  Marland Long NIKKI 3-0.02 MG tablet TAKE 1 TABLET BY MOUTH  DAILY  . [DISCONTINUED] cetirizine-pseudoephedrine (ZYRTEC-D) 5-120 MG tablet Take 1 tablet by mouth 2 (two) times daily.    Allergies:   Amoxicillin  Review of Systems (ROS):  Review of systems NEGATIVE unless otherwise noted in narrative H&P section.   Vital Signs: Today's Vitals   07/09/19 1218 07/09/19 1221 07/09/19 1401  BP:  134/76   Pulse:  76   Resp:  18   Temp:  97.9 F (36.6 C)   TempSrc:  Oral   SpO2:  100%   Weight: 154 lb 1.6 oz (69.9 kg)    Height: 5\' 7"  (1.702 m)    PainSc: 6   6  Physical Exam: Physical Exam  Constitutional: She is oriented to person, place, and time and well-developed, well-nourished, and in no distress.  HENT:  Head: Normocephalic and atraumatic.  Eyes: Pupils are equal, round, and reactive to light.  Cardiovascular: Normal rate, regular rhythm, normal heart sounds and intact distal pulses.  Pulmonary/Chest: Effort normal and breath sounds normal.  Abdominal: Soft. Normal appearance and bowel sounds are normal. She exhibits no distension. There is hepatomegaly. There is abdominal tenderness in the right lower quadrant. There is no rebound, no guarding and no CVA tenderness.  Neurological: She is alert and  oriented to person, place, and time. Gait normal.  Skin: Skin is warm and dry. No rash noted. She is not diaphoretic.  Psychiatric: Memory, affect and judgment normal. Her mood appears anxious.  Nursing note and vitals reviewed.   Urgent Care Treatments / Results:   Orders Placed This Encounter  Procedures  . CT Renal Stone Study  . MR LIVER W WO CONTRAST  . Urinalysis, Complete w Microscopic    LABS: PLEASE NOTE: all labs that were ordered this encounter are listed, however only abnormal results are displayed. Labs Reviewed  URINALYSIS, COMPLETE (UACMP) WITH MICROSCOPIC - Abnormal; Notable for the following components:      Result Value   Color, Urine STRAW (*)    Bacteria, UA RARE (*)    All other components within normal limits    EKG: -None  RADIOLOGY: CT Renal Stone Study  Result Date: 07/09/2019 CLINICAL DATA:  Right lower back pain. EXAM: CT ABDOMEN AND PELVIS WITHOUT CONTRAST TECHNIQUE: Multidetector CT imaging of the abdomen and pelvis was performed following the standard protocol without IV contrast. COMPARISON:  None. FINDINGS: Lower chest: Normal heart size. 5 mm sub solid nodule left lower lobe (image 2; series 15). No pleural effusion. Hepatobiliary: There is heterogeneity of the hepatic parenchyma with regional low-attenuation involving the right hepatic lobe most compatible with steatosis. Additionally, within the posterior right hepatic lobe there is a 3.5 x 3.1 cm dense masslike structure (image 14; series 2). Cyst within the anterior aspect of the liver measuring 4.8 cm. Gallbladder is unremarkable. Pancreas: Unremarkable Spleen: Unremarkable Adrenals/Urinary Tract: Normal adrenal glands. Kidneys are symmetric in size. No nephroureterolithiasis. No hydronephrosis. Urinary bladder is decompressed. Stomach/Bowel: Small hiatal hernia. No evidence for small bowel obstruction. No free fluid or free intraperitoneal air. Vascular/Lymphatic: Normal caliber abdominal aorta.  No retroperitoneal lymphadenopathy. Reproductive: Uterus and adnexal structures are unremarkable. Other: None. Musculoskeletal: No aggressive or acute appearing osseous lesions. IMPRESSION: 1. No nephroureterolithiasis. No hydronephrosis. 2. There is a 3.5 cm dense masslike structure within the posterior right hepatic lobe. Findings raise the possibility of hepatic mass in the setting of regional hepatic steatosis. The possibility of masslike fatty sparing is a secondary consideration. This needs definitive evaluation with pre and post contrast-enhanced MRI as this may represent a true hepatic mass. 3. Hepatic steatosis. 4. 5 mm sub solid nodule left lower lobe. No follow-up needed if patient is low-risk. Non-contrast chest CT can be considered in 12 months if patient is high-risk. This recommendation follows the consensus statement: Guidelines for Management of Incidental Pulmonary Nodules Detected on CT Images: From the Fleischner Society 2017; Radiology 2017; 284:228-243. 5. These results will be called to the ordering clinician or representative by the Radiologist Assistant, and communication documented in the PACS or [employer]. Electronically Signed   By: Annia Belt M.D.   On: 07/09/2019 13:38    PROCEDURES: Procedures  MEDICATIONS RECEIVED THIS VISIT:  Medications - No data to display  PERTINENT CLINICAL COURSE NOTES/UPDATES:   Initial Impression / Assessment and Plan / Urgent Care Course:  Pertinent labs & imaging results that were available during my care of the patient were personally reviewed by me and considered in my medical decision making (see lab/imaging section of note for values and interpretations).  Monica Long is a 49 y.o. female who presents to [facility] today with complaints of Abdominal Pain (RLQ)  Patient is well appearing overall in clinic today. She does not appear to be in any acute distress. Presenting symptoms (see HPI) and exam as documented above.  Symptoms present x 3 weeks with acute worsening today. (+) nausea without vomiting. No fevers. PMH (+) for urolithiasis. Recent labs and imaging from PCP reviewed. Will pursue further workup as follows:   UA today negative for infection; 0-5 WBC/hpf, 0 RBC/hpf, and rare bacteria; no nitrites or LE.   CT of the abdomen and pelvis without contrast performed. No urolithiasis, hydronephrosis, or hydroureter noted. Incidental findings noted:  3.5 x 3.1 cm dense mass in the posterior RIGHT hepatic lobe concerning for hepatic mass in the setting of known steatosis.   4.8 cm cyst in the anterior aspect of the liver.  5 mm sub-solid LEFT lower lobe pulmonary nodule.   Results reviewed with patient. Discussed concerning findings and recommendations for MRI imaging to further assess and characterize the hepatic areas of concern. Reassurance provided. Patient encouraged not to worry until we have more definitive information that points to this being malignancy/neoplasm versus something of a more benign etiology. Patient amenable to having the MRI done and wishes for [place] to proceed ASAP. Also discussed single pulmonary nodule and recommendations for follow up CT imaging in 12 months. Patient verbalized understanding. Reached out to PCP Jodi Marble, PA-C) to make her aware of today's visit and plans for MRI imaging. PA asking that results be forwarded to her once received and she would take care of referring patient to GI and/or oncology if needed. I am attempting to obtain authorization for the MRI from the patient's insurance company. I will plan on contacting her once approval obtained. In the interim, will send in a supply of ondansetron for PRN use for patient's nausea. Pain is significant and is currently unmanaged by APAP and IBU alone. Will add Norco 5/325 mg tablets for PRN use for patient's pain; indications and side effects reviewed.   Discussed follow up with primary care physician ASAP. I have reviewed the  follow up and strict return precautions for any new or worsening symptoms. Patient is aware of symptoms that would be deemed urgent/emergent, and would thus require further evaluation either here or in the emergency department. At the time of discharge, she verbalized understanding and consent with the discharge plan as it was reviewed with her. All questions were fielded by provider and/or clinic staff prior to patient discharge.    Final Clinical Impressions / Urgent Care Diagnoses:   Final diagnoses:  Abdominal pain, unspecified abdominal location  Abnormal CT of liver  Solitary pulmonary nodule    New Prescriptions:  [place] Controlled Substance Registry consulted? Yes, I have consulted the [place] Controlled Substances Registry for this patient, and feel the risk/benefit ratio today is favorable for proceeding with this prescription for a controlled substance.  . Discussed use of controlled substance medication to treat her acute pain.  o Reviewed [place] STOP Act regulations  o Clinic does not refill controlled substances over the phone without  face to face evaluation.  . Safety precautions reviewed.  o Medications should not be bitten, chewed, sold, or taken with alcohol.  o Avoid use while working, driving, or operating heavy machinery.  o Side effects associated with the use of this particular medication reviewed. - Patient understands that this medication can cause CNS depression, increase her risk of falls, and even lead to overdose that may result in death, if used outside of the parameters that she and I discussed.  With all of this in mind, she knowingly accepts the risks and responsibilities associated with intended course of treatment, and elects to responsibly proceed as discussed.  Meds ordered this encounter  Medications  . ondansetron (ZOFRAN-ODT) 4 MG disintegrating tablet    Sig: Take 1 tablet (4 mg total) by mouth every 8 (eight) hours as needed.    Dispense:  15 tablet    Refill:   0  . HYDROcodone-acetaminophen (NORCO) 5-325 MG tablet    Sig: Take 1 tablet by mouth 3 (three) times daily as needed for moderate pain.    Dispense:  12 tablet    Refill:  0    Recommended Follow up Care:  Patient encouraged to follow up with the following provider within the specified time frame, or sooner as dictated by the severity of her symptoms. As always, she was instructed that for any urgent/emergent care needs, she should seek care either here or in the emergency department for more immediate evaluation.  Follow-up Information    Schedule an appointment as soon as possible for a visit  with Trinna Post, PA-C.   Specialty: Physician Assistant Contact information: [address] [place] [address] [phone]         NOTE: This note was prepared using Dragon dictation software along with smaller phrase technology. Despite my best ability to proofread, there is the potential that transcriptional errors may still occur from this process, and are completely unintentional.    Karen Kitchens, NP 07/09/19 1745

## 2019-07-09 NOTE — ED Triage Notes (Signed)
Pt c/o RLQ pain. Started about 3 weeks ago. She has seen her PCP and had labs and U/S and both were normal. Pt states that the pain has gotten worse since being seen by PCP. Denies urinary symptoms or fever.

## 2019-07-09 NOTE — Discharge Instructions (Signed)
It was very nice seeing you today in clinic. Thank you for entrusting me with your care.   CT revealed 2 masses in your liver. Based on the current imaging, we are unsure what these areas are. We need to do a MRI. I will order the scan and have them call you to schedule. DONT WORRY until we know something definitive. This could represent something totally benign.   Use pain and nausea medication as needed. Be careful as the pain medication can make you sleepy.   Make arrangements to follow up with your regular doctor. If your symptoms/condition worsens, please seek follow up care either here or in the ER. Please remember, our [facility] providers are "right here with you" when you need [place].   Again, it was my pleasure to take care of you today. Thank you for choosing our clinic. I hope that you start to feel better quickly.   Quentin Mulling, MSN, APRN, FNP-C, CEN Advanced Practice Provider  [facility] [place] [facility]

## 2019-07-09 NOTE — Telephone Encounter (Signed)
Can we reach out to patient and schedule appointment? She can come in tomorrow, it can be in person.

## 2019-07-09 NOTE — Telephone Encounter (Signed)
Patient message other provider through my chart.

## 2019-07-09 NOTE — Telephone Encounter (Signed)
Message was addressed via another message. 

## 2019-07-10 ENCOUNTER — Telehealth (INDEPENDENT_AMBULATORY_CARE_PROVIDER_SITE_OTHER): Payer: Managed Care, Other (non HMO) | Admitting: Physician Assistant

## 2019-07-10 ENCOUNTER — Encounter: Payer: Self-pay | Admitting: Physician Assistant

## 2019-07-10 ENCOUNTER — Telehealth: Payer: Self-pay | Admitting: Urgent Care

## 2019-07-10 DIAGNOSIS — F419 Anxiety disorder, unspecified: Secondary | ICD-10-CM

## 2019-07-10 DIAGNOSIS — R160 Hepatomegaly, not elsewhere classified: Secondary | ICD-10-CM | POA: Diagnosis not present

## 2019-07-10 MED ORDER — HYDROXYZINE HCL 10 MG PO TABS: 10.0000 mg | ORAL_TABLET | Freq: Three times a day (TID) | ORAL | 1 refills | Status: DC | PRN

## 2019-07-10 NOTE — Telephone Encounter (Signed)
Filled out FMLA forms. She has to sign these and she can take it to her employer or we can fax them.

## 2019-07-10 NOTE — Telephone Encounter (Signed)
    Date: 07/10/2019  Name: Monica Long DOB: [birth year] MRN: [id number]  Re:  Plans for MRI  Authorization for MRI liver w/wo obtained from [facility] #: [id number] expiring 10/07/19). Clinical office staff asked to make appointment for STAT imaging. Appointment secured for tomorrow (07/11/2019) at 10:30 AM for the ordered MRI imaging. Radiology has asked that patient remain NPO after 0700. I have been in contact with patient today via MyChart and she has been responsive. I have sent her the aforementioned details regarding approval and appointment information to her in a MyChart message that I have verified that she has read. I will also forward this update to her PCP. I will plan on following up with the patient following review of the imaging/results to discuss plans going forward.   Quentin Mulling, MSN, APRN, FNP-C, CEN Advanced Practice Provider [facility] [place] [facility] 07/10/2019 2:35 PM

## 2019-07-10 NOTE — Telephone Encounter (Signed)
Oops. Think this was likely meant for you

## 2019-07-10 NOTE — Progress Notes (Signed)
Virtual telephone visit    Virtual Visit via Telephone Note   This visit type was conducted due to national recommendations for restrictions regarding the COVID-19 Pandemic (e.g. social distancing) in an effort to limit this patient's exposure and mitigate transmission in our community. Due to her co-morbid illnesses, this patient is at least at moderate risk for complications without adequate follow up. This format is felt to be most appropriate for this patient at this time. The patient did not have access to video technology or had technical difficulties with video requiring transitioning to audio format only (telephone). Physical exam was limited to content and character of the telephone converstion.    Patient location: Home Provider location: Office   Patient: Monica Long   DOB: 01/28/1971   49 y.o. Female  MRN: [id number] Visit Date: 07/10/2019  Today's Provider: Trinna Post, PA-C  Subjective:    Chief Complaint  Patient presents with  . Abnormal Lab   HPI Patient presents today on phone to address results of MRI of Liver that was ordered.  Patient was seen in this clinic on 07/03/2019 for RUQ abdominal pain. RUQ was ordered and showed the following:  IMPRESSION: 1. No acute abnormality. 2. Hepatic steatosis with areas of sparing along the gallbladder fossa.  There was a note of a 4 cm cyst in the right hepatic lobe.   Patient presented to urgent care on 07/09/2019 for worsening abdominal pain. The findings are below:  IMPRESSION: 1. No nephroureterolithiasis. No hydronephrosis. 2. There is a 3.5 cm dense masslike structure within the posterior right hepatic lobe. Findings raise the possibility of hepatic mass in the setting of regional hepatic steatosis. The possibility of masslike fatty sparing is a secondary consideration. This needs definitive evaluation with pre and post contrast-enhanced MRI as this may represent a true hepatic mass. 3. Hepatic  steatosis. 4. 5 mm sub solid nodule left lower lobe. No follow-up needed if patient is low-risk. Non-contrast chest CT can be considered in 12 months if patient is high-risk. This recommendation follows the consensus statement: Guidelines for Management of Incidental Pulmonary Nodules Detected on CT Images: From the Fleischner Society 2017; Radiology 2017; 284:228-243. 5. These results will be called to the ordering clinician or representative by the Radiologist Assistant, and communication documented in the PACS or [employer].  An MRI was ordered and patient was given an Rx for narcotic pain medication as she was not getting relief from Tylenol and ibuprofen.  Anxiety: Patient was prescribed zoloft 50 mg QD in 04/09/2019 but had headaches from this. She was discontinued on this in favor of wellubtrin which she reports caused diarrhea. She has self discontinued this and is not currently taking any anxiety medication.   She is requesting FMLA filled out from 07/10/2019 until 07/23/2019. She reports continued nausea and abdominal pain as well as increased anxiety.   CMP Latest Ref Rng & Units 07/03/2019 08/16/2017 02/21/2015  Glucose 65 - 99 mg/dL 85 83 105(H)  BUN 6 - 24 mg/dL 16 16 13   Creatinine 0.57 - 1.00 mg/dL 0.76 0.74 0.77  Sodium 134 - 144 mmol/L 139 140 134(L)  Potassium 3.5 - 5.2 mmol/L 4.4 4.4 4.4  Chloride 96 - 106 mmol/L 100 98 98(L)  CO2 20 - 29 mmol/L 21 26 26   Calcium 8.7 - 10.2 mg/dL 9.8 10.3(H) 9.7  Total Protein 6.0 - 8.5 g/dL 7.3 7.5 -  Total Bilirubin 0.0 - 1.2 mg/dL <0.2 0.3 -  Alkaline Phos 39 -  117 IU/L 67 65 -  AST 0 - 40 IU/L 12 27 -  ALT 0 - 32 IU/L 12 32 -    CBC    Component Value Date/Time   WBC 8.9 07/03/2019 1502   WBC 13.9 (H) 02/21/2015 2102   RBC 4.44 07/03/2019 1502   RBC 4.63 02/21/2015 2102   HGB 13.3 07/03/2019 1502   HCT 38.7 07/03/2019 1502   PLT 267 07/03/2019 1502   MCV 87 07/03/2019 1502   MCH 30.0 07/03/2019 1502   MCH 29.7  02/21/2015 2102   MCHC 34.4 07/03/2019 1502   MCHC 35.0 02/21/2015 2102   RDW 13.3 07/03/2019 1502   LYMPHSABS 3.5 (H) 07/03/2019 1502   MONOABS 0.5 02/21/2015 2102   EOSABS 0.1 07/03/2019 1502   BASOSABS 0.1 07/03/2019 1502       Patient Active Problem List   Diagnosis Date Noted  . Varicose veins of leg with pain, bilateral 06/19/2016  . Swelling of limb 06/19/2016   Past Medical History:  Diagnosis Date  . Anxiety    panic attack  . Depression 02/08/2014  . Hepatic steatosis   . History of mammogram 01/07/2014; 05-09-15   birad 2; neg  . History of Papanicolaou smear of cervix 01/07/2014   -/-  . Hypercholesteremia   . Hypertriglyceridemia   . Kidney stone 02/2015  . Peripheral vascular disease [facility])    Past Surgical History:  Procedure Laterality Date  . CESAREAN SECTION  05/08/2001   x1   Allergies  Allergen Reactions  . Amoxicillin     Severe vomiting       Medications: Outpatient Medications Prior to Visit  Medication Sig  . HYDROcodone-acetaminophen (NORCO) 5-325 MG tablet Take 1 tablet by mouth 3 (three) times daily as needed for moderate pain.  Marland Kitchen ipratropium (ATROVENT) 0.06 % nasal spray Place 2 sprays into both nostrils 4 (four) times daily as needed for rhinitis.  Marland Kitchen NIKKI 3-0.02 MG tablet TAKE 1 TABLET BY MOUTH  DAILY  . ondansetron (ZOFRAN-ODT) 4 MG disintegrating tablet Take 1 tablet (4 mg total) by mouth every 8 (eight) hours as needed.   No facility-administered medications prior to visit.    Review of Systems      Objective:    LMP 06/19/2019 (Approximate)        Assessment & Plan:    1. Liver mass, right lobe  Placed urgent referral to cancer center. Explained to patient I think they will offer the most streamlined assessment of this liver mass. Unfortunately, there is no way to expedite an MRI. Encouraged to take it one step at a time. FMLA forms filled out. Counseled patient that the diagnostic portion of this workup may not  conclude in 2 weeks. Will refill nausea medicine as needed. Advised to take 1/2 pill of narcotics and pretreat with nausea medicine.   - Ambulatory referral to Hematology / Oncology  2. Anxiety  - hydrOXYzine (ATARAX/VISTARIL) 10 MG tablet; Take 1 tablet (10 mg total) by mouth 3 (three) times daily as needed.  Dispense: 30 tablet; Refill: 1    No follow-ups on file.    I discussed the assessment and treatment plan with the patient. The patient was provided an opportunity to ask questions and all were answered. The patient agreed with the plan and demonstrated an understanding of the instructions.   The patient was advised to call back or seek an in-person evaluation if the symptoms worsen or if the condition fails to improve as anticipated.  I provided 25 minutes of non-face-to-face time during this encounter.  ITrey Sailors, PA-C, have reviewed all documentation for this visit. The documentation on 07/10/19 for the exam, diagnosis, procedures, and orders are all accurate and complete.  Maryella Shivers [facility] [phone] (phone) [phone] (fax)  [facility]

## 2019-07-11 ENCOUNTER — Encounter: Payer: Self-pay | Admitting: Physician Assistant

## 2019-07-11 ENCOUNTER — Telehealth: Payer: Self-pay | Admitting: Urgent Care

## 2019-07-11 ENCOUNTER — Other Ambulatory Visit: Payer: Self-pay

## 2019-07-11 ENCOUNTER — Ambulatory Visit
Admission: RE | Admit: 2019-07-11 | Discharge: 2019-07-11 | Disposition: A | Discharge status: Home / Self Care | Payer: Managed Care, Other (non HMO) | Source: Ambulatory Visit | Attending: Urgent Care | Admitting: Urgent Care

## 2019-07-11 DIAGNOSIS — R160 Hepatomegaly, not elsewhere classified: Secondary | ICD-10-CM | POA: Insufficient documentation

## 2019-07-11 DIAGNOSIS — K760 Fatty (change of) liver, not elsewhere classified: Secondary | ICD-10-CM | POA: Diagnosis present

## 2019-07-11 MED ORDER — GADOBUTROL 1 MMOL/ML IV SOLN
7.0000 mL | Freq: Once | INTRAVENOUS | Status: AC | PRN
  Administered 2019-07-11: 12:00:00 7 mL via INTRAVENOUS

## 2019-07-11 NOTE — Telephone Encounter (Signed)
    Date: 07/11/2019  Name: Monica Long DOB: 14-Aug-1970 MRN: [id number]  Re: MRI results  Patient underwent MRI imaging of the liver today following CT scan on the abdomen that revealed concerns for hepatic mass. Results reviewed as follows:  MR LIVER W WO CONTRAST  Result Date: 07/11/2019 CLINICAL DATA:  Characterize liver lesions identified by prior CT EXAM: MRI ABDOMEN WITHOUT AND WITH CONTRAST TECHNIQUE: Multiplanar multisequence MR imaging of the abdomen was performed both before and after the administration of intravenous contrast. CONTRAST:  38mL GADAVIST GADOBUTROL 1 MMOL/ML IV SOLN COMPARISON:  CT abdomen pelvis, 07/09/2019 FINDINGS: Lower chest: No acute findings. Hepatobiliary: Hepatomegaly, maximum coronal span 22 cm hepatic steatosis. There is a 3.2 x 2.9 cm mass in the posterior right lobe of the liver, hepatic segment VII, demonstrating progressive discontinuous peripheral nodular enhancement, consistent with a benign hemangioma (series 24, image 31). There is an additional, nonenhancing simple subcapsular cyst of the anterior left lobe of the liver, hepatic segment VIII, measuring 4.7 x 4.2 cm (series 24, image 30). Pancreas: No mass, inflammatory changes, or other parenchymal abnormality identified. Spleen:  At upper normal limits in size and appearance. Adrenals/Urinary Tract: No masses identified. Nonenhancing fluid signal cysts. No evidence of hydronephrosis. Stomach/Bowel: Visualized portions within the abdomen are unremarkable. Vascular/Lymphatic: No pathologically enlarged lymph nodes identified. No abdominal aortic aneurysm demonstrated. Other:  None. Musculoskeletal: No suspicious bone lesions identified. IMPRESSION: 1. Lesion of the posterior right lobe of the liver identified by prior noncontrast CT is characterized as a benign hemangioma by multiphasic contrast enhanced MR. There is an additional benign cyst of the anterior right lobe of the liver. 2.  Hepatomegaly and  hepatic steatosis. Electronically Signed   By: Lauralyn Primes M.D.   On: 07/11/2019 12:17    Disposition: MRI demonstrated that areas concerning for mass in the posterior RIGHT hepatic lobe represents a benign hemangioma. The simple cyst in the anterior RIGHT hepatic lobe was once again identified and characterized as benign as well. These were among the differentials discussed with patient when we initially discovered the area on CT. MRI did demonstrate the hepatomegaly noted on physical exam, in addition to the hepatic steatosis noted on both the previous abdominal ultrasound and CT imaging.  I have forwarded the results to the patient with a note making her aware that the findings were of a benign etiology and that no further intervention is required at this time. I will also forward a copy of the MRI results to her PCP for review. Patient to follow up with PCP as previously discussed for ongoing evaluation of her abdominal pain.  Quentin Mulling, MSN, APRN, FNP-C, CEN Advanced Practice Provider [facility] 07/11/2019 1:02 PM

## 2019-07-13 ENCOUNTER — Inpatient Hospital Stay: Payer: Managed Care, Other (non HMO)

## 2019-07-13 ENCOUNTER — Inpatient Hospital Stay: Payer: Managed Care, Other (non HMO) | Admitting: Internal Medicine

## 2019-07-13 NOTE — Telephone Encounter (Signed)
Pt called asking if she needs to come and pick up the copy that [place] signed.  Pt's cb # [phone]

## 2019-07-13 NOTE — Telephone Encounter (Signed)
Yes she can pick them up and sign her portion.

## 2019-07-20 ENCOUNTER — Other Ambulatory Visit: Payer: Managed Care, Other (non HMO)

## 2019-07-20 ENCOUNTER — Ambulatory Visit: Payer: Managed Care, Other (non HMO) | Admitting: Internal Medicine

## 2019-07-23 NOTE — Progress Notes (Signed)
Complete physical exam   Patient: Monica Long   DOB: 1970/09/17   49 y.o. Female  MRN: [id number] Visit Date: 07/24/2019  Today's healthcare provider: Trinna Post, PA-C   Chief Complaint  Patient presents with  . Annual Exam  I,Porsha C McClurkin,acting as a scribe for Trinna Post, PA-C.,have documented all relevant documentation on the behalf of Trinna Post, PA-C,as directed by  Trinna Post, PA-C while in the presence of Trinna Post, PA-C.  Subjective    Monica Long is a 49 y.o. female who presents today for a complete physical exam.  She reports consuming a low fat diet. Home exercise routine includes walking 1  hrs per day. She generally feels well. She reports sleeping well. She does have additional problems to discuss today.  HPI  Patient would like to discuss her mass/ fatty liver. Recently diagnosed with hepatomegaly, fatty liver and hemangioma. Patient was advised she could get her Hep A & B vaccine but would have to postpone due to patient having COVID vaccine. Patient will have hep A & B titer done today to see if she has a immunity.   Patient states she may want to consider coming off of her birthcontrol in the future, but patient report she only use the birthcontrol because it helps with her headaches.    Depression Patient was started on Lexapro 10 mg daily for her depression. Patient has tried zoloft in the past which caused headaches and then wellbutrin which she also did not tolerate.   Past Medical History:  Diagnosis Date  . Anxiety    panic attack  . Depression 02/08/2014  . Hepatic steatosis   . History of mammogram 01/07/2014; 05-09-15   birad 2; neg  . History of Papanicolaou smear of cervix 01/07/2014   -/-  . Hypercholesteremia   . Hypertriglyceridemia   . Kidney stone 02/2015  . Peripheral vascular disease [facility])    Past Surgical History:  Procedure Laterality Date  . CESAREAN SECTION  05/08/2001   x1    Social History   Socioeconomic History  . Marital status: Single    Spouse name: Not on file  . Number of children: 1  . Years of education: 57  . Highest education level: Not on file  Occupational History    Employer: [employer]  Tobacco Use  . Smoking status: Never Smoker  . Smokeless tobacco: Never Used  Substance and Sexual Activity  . Alcohol use: No    Alcohol/week: 0.0 standard drinks  . Drug use: No  . Sexual activity: Yes    Birth control/protection: Pill  Other Topics Concern  . Not on file  Social History Narrative  . Not on file   Social Determinants of Health   Financial Resource Strain:   . Difficulty of Paying Living Expenses:   Food Insecurity:   . Worried About Charity fundraiser in the Last Year:   . Arboriculturist in the Last Year:   Transportation Needs:   . Film/video editor (Medical):   Marland Kitchen Lack of Transportation (Non-Medical):   Physical Activity:   . Days of Exercise per Week:   . Minutes of Exercise per Session:   Stress:   . Feeling of Stress :   Social Connections:   . Frequency of Communication with Friends and Family:   . Frequency of Social Gatherings with Friends and Family:   . Attends Religious Services:   .  Active Member of Clubs or Organizations:   . Attends Banker Meetings:   Marland Kitchen Marital Status:   Intimate Partner Violence:   . Fear of Current or Ex-Partner:   . Emotionally Abused:   Marland Kitchen Physically Abused:   . Sexually Abused:    Family Status  Relation Name Status  . Mother  Alive  . Father  Alive  . MGM  Alive   Family History  Problem Relation Age of Onset  . Healthy Mother   . Hypertension Father   . Colon cancer Maternal Grandmother 54   Allergies  Allergen Reactions  . Amoxicillin     Severe vomiting     Patient Care Team: Maryella Shivers as PCP - General (Physician Assistant)   Medications: Outpatient Medications Prior to Visit  Medication Sig  . [DISCONTINUED] NIKKI 3-0.02  MG tablet TAKE 1 TABLET BY MOUTH  DAILY  . HYDROcodone-acetaminophen (NORCO) 5-325 MG tablet Take 1 tablet by mouth 3 (three) times daily as needed for moderate pain. (Patient not taking: Reported on 07/24/2019)  . hydrOXYzine (ATARAX/VISTARIL) 10 MG tablet Take 1 tablet (10 mg total) by mouth 3 (three) times daily as needed. (Patient not taking: Reported on 07/24/2019)  . ipratropium (ATROVENT) 0.06 % nasal spray Place 2 sprays into both nostrils 4 (four) times daily as needed for rhinitis. (Patient not taking: Reported on 07/24/2019)  . ondansetron (ZOFRAN-ODT) 4 MG disintegrating tablet Take 1 tablet (4 mg total) by mouth every 8 (eight) hours as needed. (Patient not taking: Reported on 07/24/2019)   No facility-administered medications prior to visit.    Review of Systems  Constitutional: Negative.   HENT: Negative.   Eyes: Negative.   Respiratory: Negative.   Cardiovascular: Negative.   Gastrointestinal: Negative.   Endocrine: Negative.   Genitourinary: Negative.   Musculoskeletal: Negative.   Skin: Negative.   Allergic/Immunologic: Negative.   Neurological: Negative.   Hematological: Negative.       Objective    BP 118/78 (BP Location: Right Arm, Patient Position: Sitting, Cuff Size: Normal)   Pulse 87   Temp (!) 95.6 F (35.3 C) (Temporal)   Ht 5\' 7"  (1.702 m)   Wt 152 lb 9.6 oz (69.2 kg)   LMP 06/19/2019   SpO2 95%   BMI 23.90 kg/m    Physical Exam Constitutional:      Appearance: Normal appearance.  HENT:     Right Ear: Tympanic membrane, ear canal and external ear normal.     Left Ear: Tympanic membrane, ear canal and external ear normal.  Cardiovascular:     Rate and Rhythm: Normal rate and regular rhythm.     Pulses: Normal pulses.     Heart sounds: Normal heart sounds.  Pulmonary:     Effort: Pulmonary effort is normal.     Breath sounds: Normal breath sounds.  Abdominal:     General: Abdomen is flat. Bowel sounds are normal.     Palpations: Abdomen is  soft.  Skin:    General: Skin is warm and dry.  Neurological:     General: No focal deficit present.     Mental Status: She is alert and oriented to person, place, and time.  Psychiatric:        Mood and Affect: Mood normal.        Behavior: Behavior normal.       Depression Screen  PHQ 2/9 Scores 07/24/2019 07/03/2019 11/01/2017  PHQ - 2 Score 0 - 0  PHQ- 9  Score 2 - 2  Exception Documentation - Patient refusal -    No results found for any visits on 07/24/19.  Assessment & Plan    1. Annual physical exam Patient is UTD on pap. Defer mammogram, reconsider next year.  - Lipid panel - TSH  2. Liver mass, right lobe  Plan on hep Vaccinations as needed next visit, as well as Tdap.   - Hepatitis A Ab, Total - Hepatitis B surface antibody,qualitative  3. Depression, unspecified depression type Patient was prescribed Lexapro as below.  Will Start lexapro 10  mg daily Discussed potential side effects, incl GI upset, sexual dysfunction, increased anxiety, and SI Discussed that it can take 6-8 weeks to reach full efficacy Contracted for safety - no SI/HI Discussed synergistic effects of medications and therapy  - escitalopram (LEXAPRO) 10 MG tablet; Take 1 tablet (10 mg total) by mouth daily.  Dispense: 90 tablet; Refill: 1  4. Acute rhinitis Patient medication was refilled. - azelastine (ASTELIN) 0.1 % nasal spray; Place 1 spray into both nostrils 2 (two) times daily. Use in each nostril as directed  Dispense: 30 mL; Refill: 2 - fluticasone (FLONASE) 50 MCG/ACT nasal spray; Place 2 sprays into both nostrils daily.  Dispense: 11.1 mL; Refill: 1  5. Encounter for contraceptive management, unspecified type Patient medication was refilled. Patient was also advised about maybe coming off of her birthcontrol in the future to assess for menopause.  - drospirenone-ethinyl estradiol (NIKKI) 3-0.02 MG tablet; Take 1 tablet by mouth daily.  Dispense: 84 tablet; Refill: 3  6. Chronic  nonintractable headache, unspecified headache type  - drospirenone-ethinyl estradiol (NIKKI) 3-0.02 MG tablet; Take 1 tablet by mouth daily.  Dispense: 84 tablet; Refill: 3   Routine Health Maintenance and Physical Exam  Exercise Activities and Dietary recommendations Goals   None     Immunization History  Administered Date(s) Administered  . Influenza-Unspecified 12/20/2018  . PFIZER SARS-COV-2 Vaccination 07/01/2019  . Tdap 01/28/2008    Health Maintenance  Topic Date Due  . TETANUS/TDAP  01/27/2018  . COVID-19 Vaccine (2 - Pfizer 2-dose series) 07/22/2019  . INFLUENZA VACCINE  10/18/2019  . PAP SMEAR-Modifier  07/10/2021  . HIV Screening  Completed    Discussed health benefits of physical activity, and encouraged her to engage in regular exercise appropriate for her age and condition.    Return in about 2 months (around 09/23/2019).     ITrey Sailors, PA-C, have reviewed all documentation for this visit. The documentation on 07/24/19 for the exam, diagnosis, procedures, and orders are all accurate and complete.    Maryella Shivers  [facility] [phone] (phone) [phone] (fax)  [facility]

## 2019-07-24 ENCOUNTER — Encounter: Payer: Self-pay | Admitting: Physician Assistant

## 2019-07-24 ENCOUNTER — Other Ambulatory Visit: Payer: Self-pay

## 2019-07-24 ENCOUNTER — Ambulatory Visit (INDEPENDENT_AMBULATORY_CARE_PROVIDER_SITE_OTHER): Payer: Managed Care, Other (non HMO) | Admitting: Physician Assistant

## 2019-07-24 VITALS — BP 118/78 | HR 87 | Temp 95.6°F | Ht 67.0 in | Wt 152.6 lb

## 2019-07-24 DIAGNOSIS — R160 Hepatomegaly, not elsewhere classified: Secondary | ICD-10-CM

## 2019-07-24 DIAGNOSIS — J00 Acute nasopharyngitis [common cold]: Secondary | ICD-10-CM | POA: Diagnosis not present

## 2019-07-24 DIAGNOSIS — Z309 Encounter for contraceptive management, unspecified: Secondary | ICD-10-CM

## 2019-07-24 DIAGNOSIS — Z0000 Encounter for general adult medical examination without abnormal findings: Secondary | ICD-10-CM | POA: Diagnosis not present

## 2019-07-24 DIAGNOSIS — G8929 Other chronic pain: Secondary | ICD-10-CM | POA: Diagnosis not present

## 2019-07-24 DIAGNOSIS — R519 Headache, unspecified: Secondary | ICD-10-CM

## 2019-07-24 DIAGNOSIS — F329 Major depressive disorder, single episode, unspecified: Secondary | ICD-10-CM

## 2019-07-24 DIAGNOSIS — F32A Depression, unspecified: Secondary | ICD-10-CM

## 2019-07-24 MED ORDER — ESCITALOPRAM OXALATE 10 MG PO TABS: 10.0000 mg | ORAL_TABLET | Freq: Every day | ORAL | 1 refills | Status: DC

## 2019-07-24 MED ORDER — DROSPIRENONE-ETHINYL ESTRADIOL 3-0.02 MG PO TABS: 1.0000 | ORAL_TABLET | Freq: Every day | ORAL | 3 refills | Status: DC

## 2019-07-24 MED ORDER — FLUTICASONE PROPIONATE 50 MCG/ACT NA SUSP: 2.0000 | Freq: Every day | NASAL | 1 refills | Status: DC

## 2019-07-24 MED ORDER — AZELASTINE HCL 0.1 % NA SOLN: 1.0000 | Freq: Two times a day (BID) | NASAL | 2 refills | Status: DC

## 2019-07-24 NOTE — Patient Instructions (Signed)
Health Maintenance, Female Adopting a healthy lifestyle and getting preventive care are important in promoting health and wellness. Ask your health care provider about:  The right schedule for you to have regular tests and exams.  Things you can do on your own to prevent diseases and keep yourself healthy. What should I know about diet, weight, and exercise? Eat a healthy diet   Eat a diet that includes plenty of vegetables, fruits, low-fat dairy products, and lean protein.  Do not eat a lot of foods that are high in solid fats, added sugars, or sodium. Maintain a healthy weight Body mass index (BMI) is used to identify weight problems. It estimates body fat based on height and weight. Your health care provider can help determine your BMI and help you achieve or maintain a healthy weight. Get regular exercise Get regular exercise. This is one of the most important things you can do for your health. Most adults should:  Exercise for at least 150 minutes each week. The exercise should increase your heart rate and make you sweat (moderate-intensity exercise).  Do strengthening exercises at least twice a week. This is in addition to the moderate-intensity exercise.  Spend less time sitting. Even light physical activity can be beneficial. Watch cholesterol and blood lipids Have your blood tested for lipids and cholesterol at 48 years of age, then have this test every 5 years. Have your cholesterol levels checked more often if:  Your lipid or cholesterol levels are high.  You are older than 48 years of age.  You are at high risk for heart disease. What should I know about cancer screening? Depending on your health history and family history, you may need to have cancer screening at various ages. This may include screening for:  Breast cancer.  Cervical cancer.  Colorectal cancer.  Skin cancer.  Lung cancer. What should I know about heart disease, diabetes, and high blood  pressure? Blood pressure and heart disease  High blood pressure causes heart disease and increases the risk of stroke. This is more likely to develop in people who have high blood pressure readings, are of African descent, or are overweight.  Have your blood pressure checked: ? Every 3-5 years if you are 18-39 years of age. ? Every year if you are 40 years old or older. Diabetes Have regular diabetes screenings. This checks your fasting blood sugar level. Have the screening done:  Once every three years after age 40 if you are at a normal weight and have a low risk for diabetes.  More often and at a younger age if you are overweight or have a high risk for diabetes. What should I know about preventing infection? Hepatitis B If you have a higher risk for hepatitis B, you should be screened for this virus. Talk with your health care provider to find out if you are at risk for hepatitis B infection. Hepatitis C Testing is recommended for:  Everyone born from 1945 through 1965.  Anyone with known risk factors for hepatitis C. Sexually transmitted infections (STIs)  Get screened for STIs, including gonorrhea and chlamydia, if: ? You are sexually active and are younger than 48 years of age. ? You are older than 48 years of age and your health care provider tells you that you are at risk for this type of infection. ? Your sexual activity has changed since you were last screened, and you are at increased risk for chlamydia or gonorrhea. Ask your health care provider if   you are at risk.  Ask your health care provider about whether you are at high risk for HIV. Your health care provider may recommend a prescription medicine to help prevent HIV infection. If you choose to take medicine to prevent HIV, you should first get tested for HIV. You should then be tested every 3 months for as long as you are taking the medicine. Pregnancy  If you are about to stop having your period (premenopausal) and  you may become pregnant, seek counseling before you get pregnant.  Take 400 to 800 micrograms (mcg) of folic acid every day if you become pregnant.  Ask for birth control (contraception) if you want to prevent pregnancy. Osteoporosis and menopause Osteoporosis is a disease in which the bones lose minerals and strength with aging. This can result in bone fractures. If you are 65 years old or older, or if you are at risk for osteoporosis and fractures, ask your health care provider if you should:  Be screened for bone loss.  Take a calcium or vitamin D supplement to lower your risk of fractures.  Be given hormone replacement therapy (HRT) to treat symptoms of menopause. Follow these instructions at home: Lifestyle  Do not use any products that contain nicotine or tobacco, such as cigarettes, e-cigarettes, and chewing tobacco. If you need help quitting, ask your health care provider.  Do not use street drugs.  Do not share needles.  Ask your health care provider for help if you need support or information about quitting drugs. Alcohol use  Do not drink alcohol if: ? Your health care provider tells you not to drink. ? You are pregnant, may be pregnant, or are planning to become pregnant.  If you drink alcohol: ? Limit how much you use to 0-1 drink a day. ? Limit intake if you are breastfeeding.  Be aware of how much alcohol is in your drink. In the [place] one drink equals one 12 oz bottle of beer (355 mL), one 5 oz glass of wine (148 mL), or one 1 oz glass of hard liquor (44 mL). General instructions  Schedule regular health, dental, and eye exams.  Stay current with your vaccines.  Tell your health care provider if: ? You often feel depressed. ? You have ever been abused or do not feel safe at home. Summary  Adopting a healthy lifestyle and getting preventive care are important in promoting health and wellness.  Follow your health care provider's instructions about healthy  diet, exercising, and getting tested or screened for diseases.  Follow your health care provider's instructions on monitoring your cholesterol and blood pressure. This information is not intended to replace advice given to you by your health care provider. Make sure you discuss any questions you have with your health care provider. Document Revised: 02/26/2018 Document Reviewed: 02/26/2018 Elsevier Patient Education  2020 Elsevier Inc.  

## 2019-07-25 LAB — LIPID PANEL
Chol/HDL Ratio: 4.2 ratio (ref 0.0–4.4)
Cholesterol, Total: 216 mg/dL — ABNORMAL HIGH (ref 100–199)
HDL: 52 mg/dL (ref >39)
LDL Chol Calc (NIH): 110 mg/dL — ABNORMAL HIGH (ref 0–99)
Triglycerides: 318 mg/dL — ABNORMAL HIGH (ref 0–149)
VLDL Cholesterol Cal: 54 mg/dL — ABNORMAL HIGH (ref 5–40)

## 2019-07-25 LAB — HEPATITIS A ANTIBODY, TOTAL: hep A Total Ab: NEGATIVE

## 2019-07-25 LAB — HEPATITIS B SURFACE ANTIBODY,QUALITATIVE: Hep B Surface Ab, Qual: NONREACTIVE

## 2019-07-25 LAB — TSH: TSH: 3.96 u[IU]/mL (ref 0.450–4.500)

## 2019-07-28 ENCOUNTER — Ambulatory Visit: Payer: Managed Care, Other (non HMO) | Attending: Internal Medicine

## 2019-07-28 DIAGNOSIS — Z23 Encounter for immunization: Secondary | ICD-10-CM

## 2019-07-28 NOTE — Progress Notes (Signed)
   Covid-19 Vaccination Clinic  Name:  Monica Long    MRN: [id number] DOB: Aug 16, 1970  07/28/2019  [name] was observed post Covid-19 immunization for 15 minutes without incident. She was provided with Vaccine Information Sheet and instruction to access the V-Safe system.   [name] was instructed to call 911 with any severe reactions post vaccine: Marland Kitchen Difficulty breathing  . Swelling of face and throat  . A fast heartbeat  . A bad rash all over body  . Dizziness and weakness   Immunizations Administered    Name Date Dose VIS Date Route   Pfizer COVID-19 Vaccine 07/28/2019  4:39 PM 0.3 mL 05/13/2018 Intramuscular   Manufacturer: [employer], [employer]   Lot: [id number]   NDC: [address]-4

## 2019-08-01 ENCOUNTER — Other Ambulatory Visit: Payer: Self-pay | Admitting: Physician Assistant

## 2019-08-01 DIAGNOSIS — K2960 Other gastritis without bleeding: Secondary | ICD-10-CM

## 2019-08-01 NOTE — Telephone Encounter (Signed)
Requested medication (s) are due for refill today: yes  Requested medication (s) are on the active medication list: discontinued  Last refill:  07/08/19  Future visit scheduled: yes to f/u   Notes to clinic:  med expired - please advise if pt  still needs med Requested Prescriptions  Pending Prescriptions Disp Refills   omeprazole (PRILOSEC) 40 MG capsule [Pharmacy Med Name: OMEPRAZOLE DR 40 MG CAPSULE] 30 capsule 0    Sig: TAKE 1 CAPSULE BY MOUTH EVERY DAY      Gastroenterology: Proton Pump Inhibitors Passed - 08/01/2019 12:34 PM      Passed - Valid encounter within last 12 months    Recent Outpatient Visits           1 week ago Annual physical exam   [facility] Osvaldo Angst M, PA-C   3 weeks ago Liver mass, right lobe   [facility] [place], [place], [place]   4 weeks ago Colicky RUQ abdominal pain   [facility] [place], [place], [place]   3 months ago Costochondritis   [facility] [place], [place], [place]   1 year ago Acute rhinitis   [facility] [place], [place], [place]       Future Appointments             In 1 month Jodi Marble, [place], PA-C [facility]

## 2019-08-03 NOTE — Telephone Encounter (Signed)
Not on patients med list

## 2019-08-17 ENCOUNTER — Other Ambulatory Visit: Payer: Self-pay | Admitting: Physician Assistant

## 2019-08-17 DIAGNOSIS — K2960 Other gastritis without bleeding: Secondary | ICD-10-CM

## 2019-08-18 NOTE — Telephone Encounter (Signed)
Requested medication (s) are due for refill today: no  Requested medication (s) are on the active medication list: yes  Last refill:  08/03/2019  Future visit scheduled: yes  Notes to clinic:  REQUEST FOR 90 DAYS PRESCRIPTION   Requested Prescriptions  Pending Prescriptions Disp Refills   omeprazole (PRILOSEC) 40 MG capsule [Pharmacy Med Name: OMEPRAZOLE DR 40 MG CAPSULE] 90 capsule 1    Sig: TAKE 1 CAPSULE BY MOUTH EVERY DAY      Gastroenterology: Proton Pump Inhibitors Passed - 08/17/2019 10:42 AM      Passed - Valid encounter within last 12 months    Recent Outpatient Visits           3 weeks ago Annual physical exam   [facility] [place], Ricki Rodriguez M, PA-C   1 month ago Liver mass, right lobe   [facility] [place], [place], [place]   1 month ago Colicky RUQ abdominal pain   [facility] [place], [place], [place]   4 months ago Costochondritis   [employer], [place], [place]   1 year ago Acute rhinitis   [facility] [place], [place], [place]       Future Appointments             In 1 month Jodi Marble, [place], PA-C [facility]

## 2019-08-30 ENCOUNTER — Encounter: Payer: Self-pay | Admitting: Physician Assistant

## 2019-09-04 ENCOUNTER — Encounter: Payer: Self-pay | Admitting: Physician Assistant

## 2019-09-24 ENCOUNTER — Ambulatory Visit: Payer: Self-pay | Admitting: Physician Assistant

## 2019-09-27 ENCOUNTER — Encounter: Payer: Self-pay | Admitting: Physician Assistant

## 2019-09-27 DIAGNOSIS — F32A Depression, unspecified: Secondary | ICD-10-CM

## 2019-09-27 DIAGNOSIS — Z309 Encounter for contraceptive management, unspecified: Secondary | ICD-10-CM

## 2019-09-27 DIAGNOSIS — R519 Headache, unspecified: Secondary | ICD-10-CM

## 2019-09-28 MED ORDER — DROSPIRENONE-ETHINYL ESTRADIOL 3-0.02 MG PO TABS: 1.0000 | ORAL_TABLET | Freq: Every day | ORAL | 1 refills | Status: DC

## 2019-09-28 MED ORDER — ESCITALOPRAM OXALATE 10 MG PO TABS: 10.0000 mg | ORAL_TABLET | Freq: Every day | ORAL | 1 refills | Status: DC

## 2019-09-29 ENCOUNTER — Encounter: Payer: Self-pay | Admitting: Physician Assistant

## 2019-09-30 NOTE — Telephone Encounter (Signed)
Please review. Thanks!  

## 2019-10-07 NOTE — Progress Notes (Signed)
Established patient visit   Patient: Monica Long   DOB: 11-Jul-1970   49 y.o. Female  MRN: [id number] Visit Date: 10/08/2019  Today's healthcare provider: Trey Sailors, PA-C   Chief Complaint  Patient presents with  . Depression   Subjective    HPI  Depression, Follow-up  She  was last seen for this 2 months ago. Changes made at last visit include patient was started on Lexapro 10 MG daily.   She reports good compliance with treatment. She is not having side effects.   She reports good tolerance of treatment.  She feels she is Improved since last visit.  Depression screen [facility] 2/9 10/08/2019 07/24/2019 11/01/2017  Decreased Interest 2 0 0  Down, Depressed, Hopeless 0 0 0  PHQ - 2 Score 2 0 0  Altered sleeping 0 0 0  Tired, decreased energy 3 2 1   Change in appetite 0 0 1  Feeling bad or failure about yourself  0 0 0  Trouble concentrating 0 0 0  Moving slowly or fidgety/restless 0 0 0  Suicidal thoughts 0 0 0  PHQ-9 Score 5 2 2   Difficult doing work/chores Somewhat difficult Somewhat difficult Not difficult at all    Reports fatigue. Reports sleeping well. Denies snoring, choking or gasping for breath. Working 7AM - 5 PM due to .   She is due for hepatitis vaccinations for fatty liver and also tdap.       Medications: Outpatient Medications Prior to Visit  Medication Sig  . azelastine (ASTELIN) 0.1 % nasal spray Place 1 spray into both nostrils 2 (two) times daily. Use in each nostril as directed  . drospirenone-ethinyl estradiol (NIKKI) 3-0.02 MG tablet Take 1 tablet by mouth daily.  [employer] escitalopram (LEXAPRO) 10 MG tablet Take 1 tablet (10 mg total) by mouth daily.  . fluticasone (FLONASE) 50 MCG/ACT nasal spray Place 2 sprays into both nostrils daily.  04-05-1996 omeprazole (PRILOSEC) 40 MG capsule TAKE 1 CAPSULE BY MOUTH EVERY DAY   No facility-administered medications prior to visit.    Review of Systems  Constitutional: Negative.    Respiratory: Negative.   Cardiovascular: Negative.   Psychiatric/Behavioral: Negative.       Objective    BP 117/75 (BP Location: Right Arm)   Pulse 69   Temp (!) 97.1 F (36.2 C)   Ht 5\' 7"  (1.702 m)   Wt 153 lb (69.4 kg)   BMI 23.96 kg/m    Physical Exam Constitutional:      Appearance: Normal appearance.  Cardiovascular:     Rate and Rhythm: Normal rate and regular rhythm.     Pulses: Normal pulses.     Heart sounds: Normal heart sounds.  Pulmonary:     Effort: Pulmonary effort is normal.     Breath sounds: Normal breath sounds.  Skin:    General: Skin is warm and dry.  Neurological:     Mental Status: She is alert and oriented to person, place, and time. Mental status is at baseline.  Psychiatric:        Mood and Affect: Mood normal.        Behavior: Behavior normal.      No results found for any visits on 10/08/19.  Assessment & Plan    1. Depression, unspecified depression type  Mood has improved with Lexapro. Try taking this at night to see if it helps with fatigue.   2. Need for Tdap vaccination  - Tdap vaccine greater than  or equal to 7yo IM  3. Need for hepatitis B vaccination  - Heplisav-B (HepB-CPG) Vaccine  4. Need for hepatitis A immunization  - Hepatitis A vaccine adult IM    Return in about 6 months (around 04/09/2020) for hepatitis vacc.      ITrey Sailors, PA-C, have reviewed all documentation for this visit. The documentation on 10/09/19 for the exam, diagnosis, procedures, and orders are all accurate and complete.    Maryella Shivers  [facility] [phone] (phone) [phone] (fax)  [facility]

## 2019-10-08 ENCOUNTER — Ambulatory Visit (INDEPENDENT_AMBULATORY_CARE_PROVIDER_SITE_OTHER): Payer: Managed Care, Other (non HMO) | Admitting: Physician Assistant

## 2019-10-08 ENCOUNTER — Encounter: Payer: Self-pay | Admitting: Physician Assistant

## 2019-10-08 ENCOUNTER — Other Ambulatory Visit: Payer: Self-pay

## 2019-10-08 VITALS — BP 117/75 | HR 69 | Temp 97.1°F | Ht 67.0 in | Wt 153.0 lb

## 2019-10-08 DIAGNOSIS — F32A Depression, unspecified: Secondary | ICD-10-CM

## 2019-10-08 DIAGNOSIS — F329 Major depressive disorder, single episode, unspecified: Secondary | ICD-10-CM | POA: Diagnosis not present

## 2019-10-08 DIAGNOSIS — Z23 Encounter for immunization: Secondary | ICD-10-CM | POA: Diagnosis not present

## 2019-10-10 ENCOUNTER — Encounter: Payer: Self-pay | Admitting: Physician Assistant

## 2019-10-23 ENCOUNTER — Other Ambulatory Visit: Payer: Self-pay | Admitting: Physician Assistant

## 2019-10-23 DIAGNOSIS — J00 Acute nasopharyngitis [common cold]: Secondary | ICD-10-CM

## 2019-11-14 ENCOUNTER — Encounter: Payer: Self-pay | Admitting: Physician Assistant

## 2019-11-29 ENCOUNTER — Encounter: Payer: Self-pay | Admitting: Physician Assistant

## 2019-12-01 ENCOUNTER — Encounter: Payer: Self-pay | Admitting: Physician Assistant

## 2019-12-01 DIAGNOSIS — I83893 Varicose veins of bilateral lower extremities with other complications: Secondary | ICD-10-CM

## 2019-12-11 ENCOUNTER — Encounter: Payer: Self-pay | Admitting: Physician Assistant

## 2019-12-13 ENCOUNTER — Other Ambulatory Visit: Payer: Self-pay

## 2019-12-13 ENCOUNTER — Ambulatory Visit
Admission: RE | Admit: 2019-12-13 | Discharge: 2019-12-13 | Disposition: A | Discharge status: Home / Self Care | Payer: Managed Care, Other (non HMO) | Source: Ambulatory Visit | Attending: Emergency Medicine | Admitting: Emergency Medicine

## 2019-12-13 VITALS — BP 131/74 | HR 69 | Temp 98.0°F | Resp 19 | Ht 67.0 in | Wt 153.0 lb

## 2019-12-13 DIAGNOSIS — R197 Diarrhea, unspecified: Secondary | ICD-10-CM | POA: Diagnosis not present

## 2019-12-13 LAB — OCCULT BLOOD X 1 CARD TO LAB, STOOL: Fecal Occult Bld: NEGATIVE

## 2019-12-13 NOTE — Discharge Instructions (Addendum)
Your diarrhea may be of an infectious origin or it could be the result of residual inflammation from the stomach bug you had the week prior.  While we await the stool results you can help gut inflammation with a few of these measures.  Aloe vera, 2 capsules 3x a day or 3-4 ounces of juice 3x a day.   Beef collagen peptides (Vital Proteins is a brand) twice daily in the beverage of your choice.  L- glutamine 2000 mg 3x a day.  Probiotics such as Saccaromyces Boulardii (Jarrow is a brand) 2 capsules 3x a day.

## 2019-12-13 NOTE — ED Provider Notes (Signed)
[facility]    CSN: [id number] Arrival date & time: 12/13/19  0955      History   Chief Complaint Chief Complaint  Patient presents with   Diarrhea    HPI Monica Long is a 49 y.o. female.   49 yo female who presents for evaluation of diarrhea x 1 week that has turned black and tarry in last 3 days.  She denies fever, abdominal pain, or bleeding. She had diarrhea 2 weeks ago for a couple of days that she attributed to a stomach bug going around work. Those symptoms resolved and then the diarrhea resumed.      Past Medical History:  Diagnosis Date   Anxiety    panic attack   Depression 02/08/2014   Hepatic steatosis    History of mammogram 01/07/2014; 05-09-15   birad 2; neg   History of Papanicolaou smear of cervix 01/07/2014   -/-   Hypercholesteremia    Hypertriglyceridemia    Kidney stone 02/2015   Peripheral vascular disease [facility])     Patient Active Problem List   Diagnosis Date Noted   Varicose veins of leg with pain, bilateral 06/19/2016   Swelling of limb 06/19/2016    Past Surgical History:  Procedure Laterality Date   CESAREAN SECTION  05/08/2001   x1    OB History    Gravida  1   Para  1   Term      Preterm  1   AB      Living  1     SAB      TAB      Ectopic      Multiple      Live Births  1            Home Medications    Prior to Admission medications   Medication Sig Start Date End Date Taking? Authorizing Provider  azelastine (ASTELIN) 0.1 % nasal spray PLACE 1 SPRAY INTO BOTH NOSTRILS 2 (TWO) TIMES DAILY. USE IN EACH NOSTRIL AS DIRECTED 10/23/19  Yes Osvaldo Angst M, PA-C  drospirenone-ethinyl estradiol (NIKKI) 3-0.02 MG tablet Take 1 tablet by mouth daily. 09/28/19  Yes Trey Sailors, PA-C  escitalopram (LEXAPRO) 10 MG tablet Take 1 tablet (10 mg total) by mouth daily. 09/28/19  Yes Osvaldo Angst M, PA-C  fluticasone (FLONASE) 50 MCG/ACT nasal spray Place 2 sprays into both  nostrils daily. 07/24/19  Yes Trey Sailors, PA-C  omeprazole (PRILOSEC) 40 MG capsule TAKE 1 CAPSULE BY MOUTH EVERY DAY 08/18/19  Yes Trey Sailors, PA-C  Azelastine-Fluticasone 137-50 MCG/ACT SUSP Place 1 spray into the nose 2 (two) times daily. 04/22/18 03/09/19  Trey Sailors, PA-C  hydrochlorothiazide (MICROZIDE) 12.5 MG capsule Take 1 capsule (12.5 mg total) by mouth daily. 11/01/17 03/09/19  Trey Sailors, PA-C  sertraline (ZOLOFT) 50 MG tablet TAKE 1 TABLET BY MOUTH EVERY DAY 05/07/19 07/09/19  Trey Sailors, PA-C    Family History Family History  Problem Relation Age of Onset   Healthy Mother    Hypertension Father    Colon cancer Maternal Grandmother 41    Social History Social History   Tobacco Use   Smoking status: Never Smoker   Smokeless tobacco: Never Used  Building services engineer Use: Never used  Substance Use Topics   Alcohol use: No    Alcohol/week: 0.0 standard drinks   Drug use: No     Allergies   Amoxicillin   Review of  Systems Review of Systems  Constitutional: Positive for fatigue. Negative for activity change, appetite change and fever.  HENT: Negative for congestion and sore throat.   Respiratory: Negative for cough and shortness of breath.   Cardiovascular: Negative for chest pain.  Gastrointestinal: Positive for diarrhea. Negative for abdominal distention, abdominal pain, anal bleeding, blood in stool, constipation, nausea, rectal pain and vomiting.  Genitourinary: Negative for dysuria, frequency, hematuria and urgency.  Musculoskeletal: Negative for arthralgias and myalgias.  Skin: Negative.   Neurological: Negative for dizziness and syncope.  Hematological: Negative.   Psychiatric/Behavioral: Negative.      Physical Exam Triage Vital Signs ED Triage Vitals  Enc Vitals Group     BP 12/13/19 1012 131/74     Pulse Rate 12/13/19 1012 69     Resp 12/13/19 1012 19     Temp 12/13/19 1012 98 F (36.7 C)     Temp Source  12/13/19 1012 Oral     SpO2 --      Weight 12/13/19 1009 153 lb (69.4 kg)     Height 12/13/19 1009 5\' 7"  (1.702 m)     Head Circumference --      Peak Flow --      Pain Score 12/13/19 1009 0     Pain Loc --      Pain Edu? --      Excl. in GC? --    No data found.  Updated Vital Signs BP 131/74 (BP Location: Right Arm)    Pulse 69    Temp 98 F (36.7 C) (Oral)    Resp 19    Ht 5\' 7"  (1.702 m)    Wt 153 lb (69.4 kg)    BMI 23.96 kg/m   Visual Acuity Right Eye Distance:   Left Eye Distance:   Bilateral Distance:    Right Eye Near:   Left Eye Near:    Bilateral Near:     Physical Exam Vitals and nursing note reviewed. Exam conducted with a chaperone present (Melissa, CMA).  Constitutional:      Appearance: Normal appearance. She is normal weight.  HENT:     Head: Normocephalic and atraumatic.  Eyes:     Extraocular Movements: Extraocular movements intact.     Conjunctiva/sclera: Conjunctivae normal.  Cardiovascular:     Rate and Rhythm: Normal rate and regular rhythm.     Pulses: Normal pulses.     Heart sounds: Normal heart sounds.  Pulmonary:     Effort: Pulmonary effort is normal.     Breath sounds: Normal breath sounds.  Abdominal:     General: Abdomen is flat. Bowel sounds are normal. There is no distension.     Palpations: Abdomen is soft. There is no mass.     Tenderness: There is no abdominal tenderness. There is no guarding.  Genitourinary:    Rectum: Normal.     Comments: Guaiac collected and sent to lab.  Musculoskeletal:        General: Normal range of motion.     Cervical back: Normal range of motion and neck supple.  Skin:    General: Skin is warm and dry.     Capillary Refill: Capillary refill takes less than 2 seconds.  Neurological:     General: No focal deficit present.     Mental Status: She is alert and oriented to person, place, and time.  Psychiatric:        Mood and Affect: Mood normal.  Behavior: Behavior normal.        Thought  Content: Thought content normal.        Judgment: Judgment normal.      UC Treatments / Results  Labs (all labs ordered are listed, but only abnormal results are displayed) Labs Reviewed  GASTROINTESTINAL PANEL BY PCR, STOOL (REPLACES STOOL CULTURE)  OVA + PARASITE EXAM  OCCULT BLOOD X 1 CARD TO LAB, STOOL    EKG   Radiology No results found.  Procedures Procedures (including critical care time)  Medications Ordered in UC Medications - No data to display  Initial Impression / Assessment and Plan / UC Course  I have reviewed the triage vital signs and the nursing notes.  Pertinent labs & imaging results that were available during my care of the patient were reviewed by me and considered in my medical decision making (see chart for details).   Patient is here for evaluation of diarrhea and dark tarry stools x 3 days. She had diarrhea 2 weeks prior that she attributed to a stomach bug going around work and which resolved. The diarrhea started up again 6 days ago. She denies abdominal pani or fever. No blood in her stool. She has been using Pepto-Bismol at home for the diarrhea.  She is having 2-3 stools a day and her appetite and fluid intake have not been effected. She does admit to mild fatigue. No hemorrhoids or masses appreciated on rectal exam and no gross blood.   Will send guaiac and stool studies.   Guaiac results negative. Patient will collect stool studies at home and we will await results before antibiotics are prescribed.  Discussed that the diarrhea may be a result of residual gut inflammation as a result of the "stomach bug" she had the week prior. Discussed dietary measures to reduce inflammation.  Will D/ C home.    Final Clinical Impressions(s) / UC Diagnoses   Final diagnoses:  Diarrhea, unspecified type     Discharge Instructions     Your diarrhea may be of an infectious origin or it could be the result of residual inflammation from the stomach  bug you had the week prior.  While we await the stool results you can help gut inflammation with a few of these measures.  Aloe vera, 2 capsules 3x a day or 3-4 ounces of juice 3x a day.   Beef collagen peptides (Vital Proteins is a brand) twice daily in the beverage of your choice.  L- glutamine 2000 mg 3x a day.  Probiotics such as Saccaromyces Boulardii (Jarrow is a brand) 2 capsules 3x a day.     ED Prescriptions    None     PDMP not reviewed this encounter.   Becky Augusta, NP 12/13/19 1209

## 2019-12-13 NOTE — ED Triage Notes (Signed)
States that she started having diarrhea around 2 weeks ago that lasted for a few days and she attributed this to a stomach bug that was going around work. States that diarrhea came back on Monday and over the last 3 days states that she has been having black tarry stools. Denies abdominal pain or fevers.

## 2019-12-23 ENCOUNTER — Encounter (INDEPENDENT_AMBULATORY_CARE_PROVIDER_SITE_OTHER): Payer: Self-pay | Admitting: Nurse Practitioner

## 2020-01-22 ENCOUNTER — Encounter (INDEPENDENT_AMBULATORY_CARE_PROVIDER_SITE_OTHER): Payer: Self-pay | Admitting: Nurse Practitioner

## 2020-02-09 ENCOUNTER — Telehealth: Payer: Self-pay | Admitting: Physician Assistant

## 2020-02-09 DIAGNOSIS — F32A Depression, unspecified: Secondary | ICD-10-CM

## 2020-02-09 DIAGNOSIS — F419 Anxiety disorder, unspecified: Secondary | ICD-10-CM

## 2020-02-09 MED ORDER — ESCITALOPRAM OXALATE 10 MG PO TABS: 10.0000 mg | ORAL_TABLET | Freq: Every day | ORAL | 0 refills | Status: DC

## 2020-02-09 NOTE — Telephone Encounter (Signed)
Please review. Thanks!  

## 2020-02-09 NOTE — Telephone Encounter (Signed)
10 days [facility] [address] in [place].

## 2020-02-09 NOTE — Telephone Encounter (Signed)
Pharmacy called to ask if patient could get a 10-day supply of her medication for escitalopram (LEXAPRO) 10 MG tablet, which is on it's way through [employer] order.  Patient is out and only needs enough until her supply comes in to be sent [facility] local [facility] [address].  Please advise and call patient to let her know if this is possible.  CB# for [employer] is [phone], patient's [phone]

## 2020-02-09 NOTE — Telephone Encounter (Signed)
Called patient and left a vm to return call, if patient calls back okay for [facility] to advise of Adriana message.

## 2020-02-17 ENCOUNTER — Encounter (INDEPENDENT_AMBULATORY_CARE_PROVIDER_SITE_OTHER): Payer: Self-pay | Admitting: Nurse Practitioner

## 2020-02-22 NOTE — Telephone Encounter (Signed)
Called patient and left a detailed message about medication and advised her to send a MyChart message if she was able to pick up medication if she could not return call to office.  Called the pharmacy and they reports that patient did not come pick up 10 day supply of Lexapro.

## 2020-03-23 ENCOUNTER — Encounter (INDEPENDENT_AMBULATORY_CARE_PROVIDER_SITE_OTHER): Payer: Self-pay | Admitting: Nurse Practitioner

## 2020-04-04 ENCOUNTER — Other Ambulatory Visit: Payer: Self-pay | Admitting: Physician Assistant

## 2020-04-08 ENCOUNTER — Encounter (INDEPENDENT_AMBULATORY_CARE_PROVIDER_SITE_OTHER): Payer: Self-pay | Admitting: Nurse Practitioner

## 2020-04-11 ENCOUNTER — Encounter: Payer: Self-pay | Admitting: Physician Assistant

## 2020-04-12 ENCOUNTER — Ambulatory Visit: Payer: Managed Care, Other (non HMO) | Admitting: Physician Assistant

## 2020-04-20 ENCOUNTER — Ambulatory Visit: Payer: Managed Care, Other (non HMO) | Admitting: Physician Assistant

## 2020-04-28 ENCOUNTER — Ambulatory Visit: Payer: Managed Care, Other (non HMO) | Admitting: Physician Assistant

## 2020-05-04 ENCOUNTER — Other Ambulatory Visit: Payer: Self-pay | Admitting: Physician Assistant

## 2020-05-04 NOTE — Telephone Encounter (Signed)
Requested medications are due for refill today yes  Requested medications are on the active medication list yes   Last visit 09/2019  Future visit scheduled 05/17/2020  Notes to clinic Has three Lexapro rx on current med list, please assess.

## 2020-05-16 ENCOUNTER — Other Ambulatory Visit: Payer: Self-pay | Admitting: Physician Assistant

## 2020-05-16 DIAGNOSIS — F32A Depression, unspecified: Secondary | ICD-10-CM

## 2020-05-16 NOTE — Telephone Encounter (Signed)
  Notes to clinic: Patient has appointment tomorrow   Requested Prescriptions  Pending Prescriptions Disp Refills   escitalopram (LEXAPRO) 10 MG tablet [Pharmacy Med Name: Escitalopram Oxalate 10 MG Oral Tablet] 30 tablet 11    Sig: TAKE 1 TABLET BY MOUTH  DAILY      Psychiatry:  Antidepressants - SSRI Failed - 05/16/2020  9:33 PM      Failed - Valid encounter within last 6 months    Recent Outpatient Visits           7 months ago Depression, unspecified depression type   [facility] Osvaldo Angst M, PA-C   9 months ago Annual physical exam   [facility] Osvaldo Angst M, [place]   10 months ago Liver mass, right lobe   [facility] Osvaldo Angst M, [place]   10 months ago Colicky RUQ abdominal pain   [facility], Alessandra Bevels, [place]   1 year ago Costochondritis   [employer], Lavella Hammock, [place]       Future Appointments             Tomorrow Trey Sailors, PA-C [employer], [facility]

## 2020-05-17 ENCOUNTER — Ambulatory Visit: Payer: Managed Care, Other (non HMO) | Admitting: Physician Assistant

## 2020-05-17 ENCOUNTER — Other Ambulatory Visit: Payer: Self-pay

## 2020-05-17 ENCOUNTER — Encounter: Payer: Self-pay | Admitting: Physician Assistant

## 2020-05-17 VITALS — BP 139/70 | HR 78 | Temp 98.7°F | Wt 162.6 lb

## 2020-05-17 DIAGNOSIS — F32A Depression, unspecified: Secondary | ICD-10-CM | POA: Diagnosis not present

## 2020-05-17 DIAGNOSIS — E663 Overweight: Secondary | ICD-10-CM

## 2020-05-17 DIAGNOSIS — R160 Hepatomegaly, not elsewhere classified: Secondary | ICD-10-CM | POA: Diagnosis not present

## 2020-05-17 DIAGNOSIS — Z23 Encounter for immunization: Secondary | ICD-10-CM

## 2020-05-17 DIAGNOSIS — R1907 Generalized intra-abdominal and pelvic swelling, mass and lump: Secondary | ICD-10-CM | POA: Diagnosis not present

## 2020-05-17 MED ORDER — BUPROPION HCL ER (XL) 150 MG PO TB24: 150.0000 mg | ORAL_TABLET | Freq: Every day | ORAL | 0 refills | Status: DC

## 2020-05-17 MED ORDER — BUPROPION HCL ER (SR) 150 MG PO TB12: ORAL_TABLET | ORAL | 0 refills | Status: DC

## 2020-05-17 NOTE — Progress Notes (Signed)
Established patient visit   Patient: Monica Long   DOB: 10-11-1970   50 y.o. Female  MRN: [id number] Visit Date: 05/17/2020  Today's healthcare provider: Trey Sailors, PA-C   Chief Complaint  Patient presents with  . Depression  I,Porsha C McClurkin,acting as a scribe for Trey Sailors, PA-C.,have documented all relevant documentation on the behalf of Trey Sailors, PA-C,as directed by  Trey Sailors, PA-C while in the presence of Trey Sailors, PA-C.  Subjective    HPI   She has abdominal swelling. Reports this ha sbeen present for years. Previously evaluated by OBGYN. Last CT scan 1 year ago was non contrast CT for renal stone with hepatomegaly and hemangioma on followup MRI but no adnexal masses on CT or follow up MRI.   Depression, Follow-up   She  was last seen for this 7 months ago. Changes made at last visit include continue current medication.   She reports good compliance with treatment. She is not having side effects.   She reports good tolerance of treatment. Current symptoms include: none She feels she is Improved since last visit.  Depression screen [facility] 2/9 10/08/2019 07/24/2019 11/01/2017  Decreased Interest 2 0 0  Down, Depressed, Hopeless 0 0 0  PHQ - 2 Score 2 0 0  Altered sleeping 0 0 0  Tired, decreased energy 3 2 1   Change in appetite 0 0 1  Feeling bad or failure about yourself  0 0 0  Trouble concentrating 0 0 0  Moving slowly or fidgety/restless 0 0 0  Suicidal thoughts 0 0 0  PHQ-9 Score 5 2 2   Difficult doing work/chores Somewhat difficult Somewhat difficult Not difficult at all    ----------------------------------------------------------------------------------------- - Wt Readings from Last 3 Encounters:  05/17/20 162 lb 9.6 oz (73.8 kg)  12/13/19 153 lb (69.4 kg)  10/08/19 153 lb (69.4 kg)        Medications: Outpatient Medications Prior to Visit  Medication Sig  . escitalopram (LEXAPRO) 10 MG tablet Take  1 tablet (10 mg total) by mouth daily.  12/15/19 escitalopram (LEXAPRO) 10 MG tablet TAKE 1 TABLET BY MOUTH  DAILY  . fluticasone (FLONASE) 50 MCG/ACT nasal spray Place 2 sprays into both nostrils daily.  10/10/19 azelastine (ASTELIN) 0.1 % nasal spray PLACE 1 SPRAY INTO BOTH NOSTRILS 2 (TWO) TIMES DAILY. USE IN EACH NOSTRIL AS DIRECTED (Patient not taking: Reported on 05/17/2020)  . drospirenone-ethinyl estradiol (NIKKI) 3-0.02 MG tablet Take 1 tablet by mouth daily. (Patient not taking: Reported on 05/17/2020)  . escitalopram (LEXAPRO) 10 MG tablet Take 1 tablet (10 mg total) by mouth daily for 10 days.  04-05-1996 omeprazole (PRILOSEC) 40 MG capsule TAKE 1 CAPSULE BY MOUTH EVERY DAY (Patient not taking: Reported on 05/17/2020)   No facility-administered medications prior to visit.    Review of Systems  Constitutional: Negative.   Respiratory: Negative.   Psychiatric/Behavioral: Negative.        Objective    BP 139/70 (BP Location: Left Arm, Patient Position: Sitting, Cuff Size: Normal)   Pulse 78   Temp 98.7 F (37.1 C) (Oral)   Wt 162 lb 9.6 oz (73.8 kg)   SpO2 99%   BMI 25.47 kg/m     Physical Exam Constitutional:      Appearance: Normal appearance.  Cardiovascular:     Rate and Rhythm: Normal rate and regular rhythm.     Heart sounds: Normal heart sounds.  Pulmonary:  Effort: Pulmonary effort is normal.     Breath sounds: Normal breath sounds.  Abdominal:     General: Bowel sounds are normal. There is distension.     Palpations: Abdomen is soft. There is hepatomegaly. There is no mass.     Comments: Abdomen diffusely large.   Skin:    General: Skin is warm and dry.  Neurological:     General: No focal deficit present.     Mental Status: She is alert and oriented to person, place, and time.  Psychiatric:        Mood and Affect: Mood normal.        Behavior: Behavior normal.       No results found for any visits on 05/17/20.  Assessment & Plan    1. Depression, unspecified  depression type   - CT Abdomen Pelvis W Contrast; Future - buPROPion (WELLBUTRIN XL) 150 MG 24 hr tablet; Take 1 tablet (150 mg total) by mouth daily.  Dispense: 60 tablet; Refill: 0 - buPROPion (WELLBUTRIN SR) 150 MG 12 hr tablet; 1 tablet daily for 3 days, then 1 tablet twice daily. Stop smoking 14 days after starting medication  Dispense: 180 tablet; Refill: 0  2. Generalized abdominal mass  Reassess with imaging. Oncology visit cancelled for liver last year. Hepatomegaly with hemangioma but liver function normal. Hepatitis shots given. Reassess. May need GI referral.   - CT Abdomen Pelvis W Contrast; Future  3. Need for hepatitis A vaccination  - Hepatitis A vaccine adult IM  4. Hepatomegaly   5. Overweight  - Phentermine-Topiramate (QSYMIA) 3.75-23 MG CP24; Take one tablet daily  Dispense: 30 capsule; Refill: 1   No follow-ups on file.      ITrey Sailors, PA-C, have reviewed all documentation for this visit. The documentation on 05/26/20 for the exam, diagnosis, procedures, and orders are all accurate and complete.  The entirety of the information documented in the History of Present Illness, Review of Systems and Physical Exam were personally obtained by me. Portions of this information were initially documented by [facility] and reviewed by me for thoroughness and accuracy.     Maryella Shivers  [facility] [phone] (phone) [phone] (fax)  [facility]

## 2020-05-17 NOTE — Patient Instructions (Signed)
Topamax Contrave Belviq Qsymia [employer]

## 2020-05-18 ENCOUNTER — Encounter: Payer: Self-pay | Admitting: Physician Assistant

## 2020-05-19 ENCOUNTER — Encounter: Payer: Self-pay | Admitting: Physician Assistant

## 2020-05-20 ENCOUNTER — Encounter: Payer: Self-pay | Admitting: Physician Assistant

## 2020-05-20 MED ORDER — QSYMIA 3.75-23 MG PO CP24: ORAL_CAPSULE | ORAL | 1 refills | Status: DC

## 2020-05-26 ENCOUNTER — Encounter: Payer: Self-pay | Admitting: Physician Assistant

## 2020-05-28 ENCOUNTER — Encounter: Payer: Self-pay | Admitting: Physician Assistant

## 2020-05-28 DIAGNOSIS — E663 Overweight: Secondary | ICD-10-CM

## 2020-05-30 ENCOUNTER — Encounter: Payer: Self-pay | Admitting: Physician Assistant

## 2020-05-30 MED ORDER — QSYMIA 3.75-23 MG PO CP24: ORAL_CAPSULE | ORAL | 1 refills | Status: DC

## 2020-05-31 ENCOUNTER — Encounter: Payer: Self-pay | Admitting: Physician Assistant

## 2020-05-31 DIAGNOSIS — H9209 Otalgia, unspecified ear: Secondary | ICD-10-CM

## 2020-06-01 MED ORDER — AZITHROMYCIN 250 MG PO TABS: ORAL_TABLET | ORAL | 0 refills | Status: DC

## 2020-06-08 ENCOUNTER — Other Ambulatory Visit: Payer: Self-pay | Admitting: Physician Assistant

## 2020-06-08 DIAGNOSIS — F32A Depression, unspecified: Secondary | ICD-10-CM

## 2020-06-08 NOTE — Telephone Encounter (Signed)
Requested medication (s) are due for refill today: Yes  Requested medication (s) are on the active medication list: Yes  Last refill:  05/17/20  Future visit scheduled: Yes  Notes to clinic:  See highlighted notes from pharmacy, diagnosis code needed     Requested Prescriptions  Pending Prescriptions Disp Refills   buPROPion (WELLBUTRIN XL) 150 MG 24 hr tablet [Pharmacy Med Name: BUPROPION HCL XL 150 MG TABLET] 90 tablet 1    Sig: TAKE 1 TABLET BY MOUTH EVERY DAY      Psychiatry: Antidepressants - bupropion Passed - 06/08/2020  1:31 PM      Passed - Last BP in normal range    BP Readings from Last 1 Encounters:  05/17/20 139/70          Passed - Valid encounter within last 6 months    Recent Outpatient Visits           3 weeks ago Depression, unspecified depression type   [facility], Ricki Rodriguez M, PA-C   8 months ago Depression, unspecified depression type   [facility] [place], Ricki Rodriguez M, PA-C   10 months ago Annual physical exam   [facility] Osvaldo Angst M, [place]   11 months ago Liver mass, right lobe   [facility] Osvaldo Angst M, [place]   11 months ago Colicky RUQ abdominal pain   [facility] [place], Alessandra Bevels, [place]       Future Appointments             In 1 month Bacigalupo, Marzella Schlein, MD [facility], [facility]

## 2020-07-02 ENCOUNTER — Encounter: Payer: Self-pay | Admitting: Family Medicine

## 2020-07-18 ENCOUNTER — Telehealth: Payer: Self-pay

## 2020-07-18 DIAGNOSIS — F32A Depression, unspecified: Secondary | ICD-10-CM

## 2020-07-18 NOTE — Telephone Encounter (Signed)
[facility] faxed refill request for the following medications:  buPROPion (WELLBUTRIN SR) 150 MG 12 hr tablet   Please advise.

## 2020-07-19 ENCOUNTER — Ambulatory Visit: Payer: Self-pay | Admitting: Family Medicine

## 2020-07-19 MED ORDER — BUPROPION HCL ER (XL) 150 MG PO TB24: 1.0000 | ORAL_TABLET | Freq: Every day | ORAL | 0 refills | Status: DC

## 2020-08-24 ENCOUNTER — Telehealth: Payer: Self-pay | Admitting: Physician Assistant

## 2020-08-24 DIAGNOSIS — K2960 Other gastritis without bleeding: Secondary | ICD-10-CM

## 2020-08-24 MED ORDER — OMEPRAZOLE 40 MG PO CPDR: DELAYED_RELEASE_CAPSULE | ORAL | 0 refills | Status: DC

## 2020-08-24 NOTE — Telephone Encounter (Signed)
[facility] for the following medications:  omeprazole (PRILOSEC) 40 MG capsule  Last Rx: 08/18/19 Qty: 90 Refills: 1 LOV: 05/17/20  NOV: 09/26/20 With Dr. Leonard Schwartz  Pharmacy noted on request the medication was last filed on 08/18/19. Please advise. Thanks TNP

## 2020-09-15 ENCOUNTER — Ambulatory Visit: Payer: Managed Care, Other (non HMO) | Admitting: Nurse Practitioner

## 2020-09-15 ENCOUNTER — Encounter: Payer: Self-pay | Admitting: Nurse Practitioner

## 2020-09-15 ENCOUNTER — Other Ambulatory Visit: Payer: Self-pay

## 2020-09-15 VITALS — BP 131/83 | HR 84 | Temp 97.8°F | Wt 157.8 lb

## 2020-09-15 DIAGNOSIS — M79651 Pain in right thigh: Secondary | ICD-10-CM | POA: Diagnosis not present

## 2020-09-15 NOTE — Progress Notes (Signed)
Acute Office Visit  Subjective:    Patient ID: Monica Long, female    DOB: 19-Dec-1970, 50 y.o.   MRN: [id number]  Chief Complaint  Patient presents with   Leg Pain    Right upper thigh, started yesterday morning, and has not gone away    HPI Patient is in today for right upper lateral thigh pain that started yesterday evening. She has had pain off and on for several months in the area, but it would usually go away within a day. She wants to make sure she doesn't have a blood clot.   LEG PAIN  Duration: off and on several months, however started yesterday and not improved Severity: 6/10 Description: nagging Injury: no Onset: sudden Aggravating factors: none Alleviating factors: ibuprofen, tylenol Treatments: ibuprofen, tylenol-helps for a little bit Weakness: no Swelling: lower legs, chronic Redness: no Warmth: no Rash: no  Past Medical History:  Diagnosis Date   Anxiety    panic attack   Depression 02/08/2014   Hepatic steatosis    History of mammogram 01/07/2014; 05-09-15   birad 2; neg   History of Papanicolaou smear of cervix 01/07/2014   -/-   Hypercholesteremia    Hypertriglyceridemia    Kidney stone 02/2015   Peripheral vascular disease (HCC)     Past Surgical History:  Procedure Laterality Date   CESAREAN SECTION  05/08/2001   x1    Family History  Problem Relation Age of Onset   Healthy Mother    Hypertension Father    Colon cancer Maternal Grandmother 79    Social History   Socioeconomic History   Marital status: Single    Spouse name: Not on file   Number of children: 1   Years of education: 12   Highest education level: Not on file  Occupational History    Employer: [employer]  Tobacco Use   Smoking status: Never   Smokeless tobacco: Never  Vaping Use   Vaping Use: Never used  Substance and Sexual Activity   Alcohol use: No    Alcohol/week: 0.0 standard drinks   Drug use: No   Sexual activity: Yes    Birth  control/protection: Pill  Other Topics Concern   Not on file  Social History Narrative   Not on file   Social Determinants of Health   Financial Resource Strain: Not on file  Food Insecurity: Not on file  Transportation Needs: Not on file  Physical Activity: Not on file  Stress: Not on file  Social Connections: Not on file  Intimate Partner Violence: Not on file    Outpatient Medications Prior to Visit  Medication Sig Dispense Refill   azelastine (ASTELIN) 0.1 % nasal spray PLACE 1 SPRAY INTO BOTH NOSTRILS 2 (TWO) TIMES DAILY. USE IN EACH NOSTRIL AS DIRECTED 30 mL 6   buPROPion (WELLBUTRIN XL) 150 MG 24 hr tablet Take 1 tablet (150 mg total) by mouth daily. 90 tablet 0   drospirenone-ethinyl estradiol (NIKKI) 3-0.02 MG tablet Take 1 tablet by mouth daily. 84 tablet 1   escitalopram (LEXAPRO) 10 MG tablet TAKE 1 TABLET BY MOUTH  DAILY (Patient not taking: Reported on 09/15/2020) 90 tablet 1   fluticasone (FLONASE) 50 MCG/ACT nasal spray Place 2 sprays into both nostrils daily. 11.1 mL 1   buPROPion (WELLBUTRIN SR) 150 MG 12 hr tablet 1 tablet daily for 3 days, then 1 tablet twice daily. Stop smoking 14 days after starting medication 180 tablet 0   omeprazole (PRILOSEC) 40 MG  capsule TAKE 1 CAPSULE BY MOUTH EVERY DAY (Patient not taking: Reported on 09/15/2020) 90 capsule 0   Phentermine-Topiramate (QSYMIA) 3.75-23 MG CP24 Take one tablet daily (Patient not taking: Reported on 09/15/2020) 30 capsule 1   azithromycin (ZITHROMAX) 250 MG tablet Take 2 pills on day 1, and take 1 pill each of the following for days. 6 tablet 0   No facility-administered medications prior to visit.    Allergies  Allergen Reactions   Amoxicillin     Severe vomiting     Review of Systems  Constitutional:  Positive for fatigue. Negative for fever.  Respiratory: Negative.    Cardiovascular: Negative.   Musculoskeletal:  Positive for myalgias (right lateral thigh).  Skin: Negative.   Neurological:  Negative.       Objective:    Physical Exam Vitals and nursing note reviewed.  Constitutional:      General: She is not in acute distress.    Appearance: Normal appearance.  HENT:     Head: Normocephalic.  Eyes:     Conjunctiva/sclera: Conjunctivae normal.  Cardiovascular:     Rate and Rhythm: Normal rate and regular rhythm.     Pulses: Normal pulses.     Heart sounds: Normal heart sounds.  Pulmonary:     Effort: Pulmonary effort is normal.     Breath sounds: Normal breath sounds.  Musculoskeletal:        General: Normal range of motion.     Cervical back: Normal range of motion.     Comments: Negative right straight leg raise. Tenderness to palpation of right later thigh. No redness, bruising, or swelling noted. Varicose vein noted to the area of tenderness  Skin:    General: Skin is warm.  Neurological:     General: No focal deficit present.     Mental Status: She is alert and oriented to person, place, and time.  Psychiatric:        Mood and Affect: Mood normal.        Behavior: Behavior normal.        Thought Content: Thought content normal.        Judgment: Judgment normal.    BP 131/83   Pulse 84   Temp 97.8 F (36.6 C) (Oral)   Wt 157 lb 12.8 oz (71.6 kg)   SpO2 98%   BMI 24.71 kg/m  Wt Readings from Last 3 Encounters:  09/15/20 157 lb 12.8 oz (71.6 kg)  05/17/20 162 lb 9.6 oz (73.8 kg)  12/13/19 153 lb (69.4 kg)    There are no preventive care reminders to display for this patient.  There are no preventive care reminders to display for this patient.   Lab Results  Component Value Date   TSH 3.960 07/24/2019   Lab Results  Component Value Date   WBC 8.9 07/03/2019   HGB 13.3 07/03/2019   HCT 38.7 07/03/2019   MCV 87 07/03/2019   PLT 267 07/03/2019   Lab Results  Component Value Date   NA 139 07/03/2019   K 4.4 07/03/2019   CO2 21 07/03/2019   GLUCOSE 85 07/03/2019   BUN 16 07/03/2019   CREATININE 0.76 07/03/2019   BILITOT <0.2  07/03/2019   ALKPHOS 67 07/03/2019   AST 12 07/03/2019   ALT 12 07/03/2019   PROT 7.3 07/03/2019   ALBUMIN 4.5 07/03/2019   CALCIUM 9.8 07/03/2019   ANIONGAP 10 02/21/2015   Lab Results  Component Value Date   CHOL 216 (H) 07/24/2019  Lab Results  Component Value Date   HDL 52 07/24/2019   Lab Results  Component Value Date   LDLCALC 110 (H) 07/24/2019   Lab Results  Component Value Date   TRIG 318 (H) 07/24/2019   Lab Results  Component Value Date   CHOLHDL 4.2 07/24/2019   Lab Results  Component Value Date   HGBA1C 5.0 08/16/2017       Assessment & Plan:   Problem List Items Addressed This Visit       Other   Pain of right thigh - Primary    Right lateral thigh tenderness x1 day. No redness, swelling, warmth, recent travel, or smoker. Not likely blood clot. Most likely related to varicose veins. She was encouraged to wear compression socks during the day, even at work. She can continue tylenol/ibuprofen for pain. Can use heat/ice. Follows-up with vascular at the end of next month, encouraged her to discuss this with them.          No orders of the defined types were placed in this encounter.    Gerre Scull, NP

## 2020-09-15 NOTE — Assessment & Plan Note (Signed)
Right lateral thigh tenderness x1 day. No redness, swelling, warmth, recent travel, or smoker. Not likely blood clot. Most likely related to varicose veins. She was encouraged to wear compression socks during the day, even at work. She can continue tylenol/ibuprofen for pain. Can use heat/ice. Follows-up with vascular at the end of next month, encouraged her to discuss this with them.

## 2020-09-24 ENCOUNTER — Encounter: Payer: Self-pay | Admitting: Family Medicine

## 2020-09-24 DIAGNOSIS — E663 Overweight: Secondary | ICD-10-CM

## 2020-09-26 ENCOUNTER — Ambulatory Visit: Payer: Self-pay | Admitting: Family Medicine

## 2020-09-27 MED ORDER — QSYMIA 3.75-23 MG PO CP24: ORAL_CAPSULE | ORAL | 1 refills | Status: DC

## 2020-10-03 ENCOUNTER — Other Ambulatory Visit: Payer: Self-pay

## 2020-10-03 DIAGNOSIS — E663 Overweight: Secondary | ICD-10-CM

## 2020-10-03 MED ORDER — QSYMIA 3.75-23 MG PO CP24: ORAL_CAPSULE | ORAL | 1 refills | Status: DC

## 2020-10-20 ENCOUNTER — Other Ambulatory Visit: Payer: Self-pay | Admitting: Physician Assistant

## 2020-10-20 DIAGNOSIS — F32A Depression, unspecified: Secondary | ICD-10-CM

## 2020-10-20 NOTE — Telephone Encounter (Signed)
Refill for which medicine?

## 2020-10-24 NOTE — Telephone Encounter (Signed)
[facility] faxed refill request for the following medications:  escitalopram (LEXAPRO) 10 MG tablet   Please advise. 

## 2020-10-25 MED ORDER — ESCITALOPRAM OXALATE 10 MG PO TABS: 10.0000 mg | ORAL_TABLET | Freq: Every day | ORAL | 1 refills | Status: DC

## 2020-10-25 NOTE — Telephone Encounter (Signed)
Please review. 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

## 2020-11-09 ENCOUNTER — Telehealth: Payer: Self-pay

## 2020-11-09 DIAGNOSIS — F32A Depression, unspecified: Secondary | ICD-10-CM

## 2020-11-09 MED ORDER — ESCITALOPRAM OXALATE 10 MG PO TABS: 10.0000 mg | ORAL_TABLET | Freq: Every day | ORAL | 1 refills | Status: DC

## 2020-11-09 NOTE — Telephone Encounter (Signed)
[facility] faxed refill request for the following medications:  escitalopram (LEXAPRO) 10 MG tablet   Please advise. 

## 2020-11-11 ENCOUNTER — Other Ambulatory Visit: Payer: Self-pay | Admitting: Family Medicine

## 2020-11-11 DIAGNOSIS — K2960 Other gastritis without bleeding: Secondary | ICD-10-CM

## 2020-11-11 DIAGNOSIS — F32A Depression, unspecified: Secondary | ICD-10-CM

## 2020-11-11 MED ORDER — OMEPRAZOLE 40 MG PO CPDR: DELAYED_RELEASE_CAPSULE | ORAL | 0 refills | Status: DC

## 2020-11-15 ENCOUNTER — Other Ambulatory Visit: Payer: Self-pay | Admitting: Family Medicine

## 2020-11-15 DIAGNOSIS — F32A Depression, unspecified: Secondary | ICD-10-CM

## 2020-11-15 MED ORDER — BUPROPION HCL ER (XL) 150 MG PO TB24: 150.0000 mg | ORAL_TABLET | Freq: Every day | ORAL | 1 refills | Status: DC

## 2020-12-07 ENCOUNTER — Ambulatory Visit: Payer: Managed Care, Other (non HMO) | Admitting: Family Medicine

## 2020-12-07 NOTE — Progress Notes (Deleted)
      Established patient visit   Patient: Monica Long   DOB: 28-Oct-1970   50 y.o. Female  MRN: [id number] Visit Date: 12/07/2020  Today's healthcare provider: Megan Mans, MD   No chief complaint on file.  Subjective    HPI  ***  {Link to patient history deactivated due to formatting error:1}  Medications: Outpatient Medications Prior to Visit  Medication Sig   azelastine (ASTELIN) 0.1 % nasal spray PLACE 1 SPRAY INTO BOTH NOSTRILS 2 (TWO) TIMES DAILY. USE IN EACH NOSTRIL AS DIRECTED   buPROPion (WELLBUTRIN SR) 150 MG 12 hr tablet 1 tablet daily for 3 days, then 1 tablet twice daily. Stop smoking 14 days after starting medication   buPROPion (WELLBUTRIN XL) 150 MG 24 hr tablet Take 1 tablet (150 mg total) by mouth daily.   drospirenone-ethinyl estradiol (NIKKI) 3-0.02 MG tablet Take 1 tablet by mouth daily.   escitalopram (LEXAPRO) 10 MG tablet Take 1 tablet (10 mg total) by mouth daily.   fluticasone (FLONASE) 50 MCG/ACT nasal spray Place 2 sprays into both nostrils daily.   omeprazole (PRILOSEC) 40 MG capsule TAKE 1 CAPSULE BY MOUTH EVERY DAY   Phentermine-Topiramate (QSYMIA) 3.75-23 MG CP24 Take one tablet daily   No facility-administered medications prior to visit.    Review of Systems  Constitutional:  Negative for appetite change, chills, fatigue and fever.  Respiratory:  Negative for chest tightness and shortness of breath.   Cardiovascular:  Negative for chest pain and palpitations.  Gastrointestinal:  Negative for abdominal pain, nausea and vomiting.  Neurological:  Negative for dizziness and weakness.   {Labs  Heme  Chem  Endocrine  Serology  Results Review (optional):23779}   Objective    There were no vitals taken for this visit. {Show previous vital signs (optional):23777}  Physical Exam  ***  No results found for any visits on 12/07/20.  Assessment & Plan     ***  No follow-ups on file.      {provider  attestation***:1}   Megan Mans, MD  [facility] [phone] (phone) [phone] (fax)   [facility]

## 2020-12-08 ENCOUNTER — Ambulatory Visit: Payer: Managed Care, Other (non HMO) | Admitting: Family Medicine

## 2020-12-15 ENCOUNTER — Ambulatory Visit: Payer: Managed Care, Other (non HMO) | Admitting: Nurse Practitioner

## 2020-12-15 ENCOUNTER — Other Ambulatory Visit: Payer: Self-pay

## 2020-12-15 ENCOUNTER — Encounter: Payer: Self-pay | Admitting: Nurse Practitioner

## 2020-12-15 VITALS — BP 104/71 | HR 73 | Temp 98.3°F | Wt 145.4 lb

## 2020-12-15 DIAGNOSIS — G43719 Chronic migraine without aura, intractable, without status migrainosus: Secondary | ICD-10-CM | POA: Diagnosis not present

## 2020-12-15 MED ORDER — TOPIRAMATE 25 MG PO TABS: 25.0000 mg | ORAL_TABLET | Freq: Every day | ORAL | 0 refills | Status: DC

## 2020-12-15 MED ORDER — RIZATRIPTAN BENZOATE 10 MG PO TABS: 10.0000 mg | ORAL_TABLET | ORAL | 0 refills | Status: DC | PRN

## 2020-12-15 NOTE — Assessment & Plan Note (Addendum)
Chronic, increasing frequency over the last few months. No red flags on exam. Will start Topamax 25mg  daily for migraine prevention and maxalt for acute migraine relief. She can still take ibuprofen or excedrin as needed. She has an appointment to establish care with new PCP in 4 weeks, will keep this appointment and follow up on migraines then.

## 2020-12-15 NOTE — Progress Notes (Signed)
Acute Office Visit  Subjective:    Patient ID: Monica Long, female    DOB: 1970/11/02, 50 y.o.   MRN: [id number]  Chief Complaint  Patient presents with   Headache    Patient states she has always had an issue with headaches, recently they have gradually gotten worse. 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 Patient states she has tried OTC Ibuprofen and Excedrin. Patient states they do not help and she thinks they may be migraines.     HPI Patient is in today for headaches. She has a history of headaches, however they have gotten worse over the last few months.   MIGRAINES  Duration: months Onset: gradual Severity: mild Quality: aching and throbbing Frequency:  2 times a week Location: top of head to back of neck Headache duration: 12 hours  Radiation: no Headache status at time of visit: asymptomatic Treatments attempted: Treatments attempted:  excedrin, ibuprofen    Aura: no Nausea:  yes Vomiting: yes Photophobia:  yes Phonophobia:  yes Effect on social functioning:  yes Numbers of missed days of school/work each month: 1 missed, 2 half days Confusion:  no Gait disturbance/ataxia:  no Behavioral changes:  no Fevers:  no   Past Medical History:  Diagnosis Date   Anxiety    panic attack   Depression 02/08/2014   Hepatic steatosis    History of mammogram 01/07/2014; 05-09-15   birad 2; neg   History of Papanicolaou smear of cervix 01/07/2014   -/-   Hypercholesteremia    Hypertriglyceridemia    Kidney stone 02/2015   Peripheral vascular disease (HCC)     Past Surgical History:  Procedure Laterality Date   CESAREAN SECTION  05/08/2001   x1    Family History  Problem Relation Age of Onset   Healthy Mother    Hypertension Father    Colon cancer Maternal Grandmother 63    Social History   Socioeconomic History   Marital status: Single    Spouse name: Not on file    Number of children: 1   Years of education: 12   Highest education level: Not on file  Occupational History    Employer: [employer]  Tobacco Use   Smoking status: Never   Smokeless tobacco: Never  Vaping Use   Vaping Use: Never used  Substance and Sexual Activity   Alcohol use: No    Alcohol/week: 0.0 standard drinks   Drug use: No   Sexual activity: Yes    Birth control/protection: Pill  Other Topics Concern   Not on file  Social History Narrative   Not on file   Social Determinants of Health   Financial Resource Strain: Not on file  Food Insecurity: Not on file  Transportation Needs: Not on file  Physical Activity: Not on file  Stress: Not on file  Social Connections: Not on file  Intimate Partner Violence: Not on file    Outpatient Medications Prior to Visit  Medication Sig Dispense Refill   azelastine (ASTELIN) 0.1 % nasal spray PLACE 1 SPRAY INTO BOTH NOSTRILS 2 (TWO) TIMES DAILY. USE IN EACH NOSTRIL AS DIRECTED 30 mL 6   buPROPion (WELLBUTRIN XL) 150 MG 24 hr tablet Take 1 tablet (150 mg total) by mouth daily. 90 tablet 1   drospirenone-ethinyl estradiol (NIKKI) 3-0.02 MG tablet Take 1 tablet by mouth daily. 84 tablet 1   fluticasone (FLONASE) 50  MCG/ACT nasal spray Place 2 sprays into both nostrils daily. 11.1 mL 1   omeprazole (PRILOSEC) 40 MG capsule TAKE 1 CAPSULE BY MOUTH EVERY DAY 90 capsule 0   buPROPion (WELLBUTRIN SR) 150 MG 12 hr tablet 1 tablet daily for 3 days, then 1 tablet twice daily. Stop smoking 14 days after starting medication 180 tablet 0   escitalopram (LEXAPRO) 10 MG tablet Take 1 tablet (10 mg total) by mouth daily. 90 tablet 1   Phentermine-Topiramate (QSYMIA) 3.75-23 MG CP24 Take one tablet daily (Patient not taking: Reported on 12/15/2020) 30 capsule 1   No facility-administered medications prior to visit.    Allergies  Allergen Reactions   Amoxicillin     Severe vomiting     Review of Systems  Constitutional:  Positive for  fatigue. Negative for fever.  Respiratory: Negative.    Cardiovascular: Negative.   Gastrointestinal:  Positive for nausea. Negative for abdominal pain.  Genitourinary: Negative.   Skin: Negative.   Neurological:  Positive for headaches. Negative for dizziness.  Psychiatric/Behavioral: Negative.        Objective:    Physical Exam Vitals and nursing note reviewed.  Constitutional:      General: She is not in acute distress.    Appearance: Normal appearance.  HENT:     Head: Normocephalic.  Eyes:     Extraocular Movements: Extraocular movements intact.     Conjunctiva/sclera: Conjunctivae normal.     Pupils: Pupils are equal, round, and reactive to light.  Cardiovascular:     Rate and Rhythm: Normal rate and regular rhythm.     Pulses: Normal pulses.     Heart sounds: Normal heart sounds.  Pulmonary:     Effort: Pulmonary effort is normal.     Breath sounds: Normal breath sounds.  Abdominal:     Palpations: Abdomen is soft.     Tenderness: There is no abdominal tenderness.  Musculoskeletal:        General: Normal range of motion.     Cervical back: Normal range of motion.  Skin:    General: Skin is warm.  Neurological:     General: No focal deficit present.     Mental Status: She is alert and oriented to person, place, and time.     Motor: No weakness.     Coordination: Coordination normal.     Gait: Gait normal.  Psychiatric:        Mood and Affect: Mood normal.        Behavior: Behavior normal.        Thought Content: Thought content normal.        Judgment: Judgment normal.    BP 104/71   Pulse 73   Temp 98.3 F (36.8 C) (Oral)   Wt 145 lb 6.4 oz (66 kg)   SpO2 98%   BMI 22.77 kg/m  Wt Readings from Last 3 Encounters:  12/15/20 145 lb 6.4 oz (66 kg)  09/15/20 157 lb 12.8 oz (71.6 kg)  05/17/20 162 lb 9.6 oz (73.8 kg)    Health Maintenance Due  Topic Date Due   COVID-19 Vaccine (3 - Booster for Pfizer series) 12/28/2019   INFLUENZA VACCINE   10/17/2020    There are no preventive care reminders to display for this patient.   Lab Results  Component Value Date   TSH 3.960 07/24/2019   Lab Results  Component Value Date   WBC 8.9 07/03/2019   HGB 13.3 07/03/2019   HCT 38.7 07/03/2019  MCV 87 07/03/2019   PLT 267 07/03/2019   Lab Results  Component Value Date   NA 139 07/03/2019   K 4.4 07/03/2019   CO2 21 07/03/2019   GLUCOSE 85 07/03/2019   BUN 16 07/03/2019   CREATININE 0.76 07/03/2019   BILITOT <0.2 07/03/2019   ALKPHOS 67 07/03/2019   AST 12 07/03/2019   ALT 12 07/03/2019   PROT 7.3 07/03/2019   ALBUMIN 4.5 07/03/2019   CALCIUM 9.8 07/03/2019   ANIONGAP 10 02/21/2015   Lab Results  Component Value Date   CHOL 216 (H) 07/24/2019   Lab Results  Component Value Date   HDL 52 07/24/2019   Lab Results  Component Value Date   LDLCALC 110 (H) 07/24/2019   Lab Results  Component Value Date   TRIG 318 (H) 07/24/2019   Lab Results  Component Value Date   CHOLHDL 4.2 07/24/2019   Lab Results  Component Value Date   HGBA1C 5.0 08/16/2017       Assessment & Plan:   Problem List Items Addressed This Visit       Cardiovascular and Mediastinum   Intractable chronic migraine without aura and without status migrainosus - Primary    Chronic, increasing frequency over the last few months. No red flags on exam. Will start Topamax 25mg  daily for migraine prevention and maxalt for acute migraine relief. She can still take ibuprofen or excedrin as needed. She has an appointment to establish care with new PCP in 4 weeks, will keep this appointment and follow up on migraines then.       Relevant Medications   rizatriptan (MAXALT) 10 MG tablet   topiramate (TOPAMAX) 25 MG tablet     Meds ordered this encounter  Medications   DISCONTD: topiramate (TOPAMAX) 25 MG tablet    Sig: Take 1 tablet (25 mg total) by mouth daily.    Dispense:  30 tablet    Refill:  0   DISCONTD: rizatriptan (MAXALT) 10 MG  tablet    Sig: Take 1 tablet (10 mg total) by mouth as needed for migraine. May repeat in 2 hours if needed    Dispense:  10 tablet    Refill:  0   rizatriptan (MAXALT) 10 MG tablet    Sig: Take 1 tablet (10 mg total) by mouth as needed for migraine. May repeat in 2 hours if needed    Dispense:  10 tablet    Refill:  0   topiramate (TOPAMAX) 25 MG tablet    Sig: Take 1 tablet (25 mg total) by mouth daily.    Dispense:  30 tablet    Refill:  0      , NP

## 2020-12-16 ENCOUNTER — Ambulatory Visit: Payer: Managed Care, Other (non HMO) | Admitting: Nurse Practitioner

## 2021-01-04 ENCOUNTER — Other Ambulatory Visit: Payer: Self-pay | Admitting: Nurse Practitioner

## 2021-01-04 NOTE — Telephone Encounter (Signed)
Requested medications are due for refill today.  yes  Requested medications are on the active medications list.  yes  Last refill. 12/15/2020  Future visit scheduled.   yes  Notes to clinic.  Medication not delegated.

## 2021-01-12 ENCOUNTER — Other Ambulatory Visit: Payer: Self-pay | Admitting: Family Medicine

## 2021-01-12 DIAGNOSIS — T39395A Adverse effect of other nonsteroidal anti-inflammatory drugs [NSAID], initial encounter: Secondary | ICD-10-CM

## 2021-01-12 DIAGNOSIS — K2960 Other gastritis without bleeding: Secondary | ICD-10-CM

## 2021-01-12 NOTE — Telephone Encounter (Signed)
Requested Prescriptions  Pending Prescriptions Disp Refills  . omeprazole (PRILOSEC) 40 MG capsule [Pharmacy Med Name: Omeprazole 40 MG Oral Capsule Delayed Release] 90 capsule 3    Sig: TAKE 1 CAPSULE BY MOUTH  DAILY     Gastroenterology: Proton Pump Inhibitors Passed - 01/12/2021  5:53 AM      Passed - Valid encounter within last 12 months    Recent Outpatient Visits          4 weeks ago Intractable chronic migraine without aura and without status migrainosus   [facility] Mc[place]e, Lauren A, NP   3 months ago Pain of right thigh   [facility] McElwee, Lauren A, NP   8 months ago Depression, unspecified depression type   [facility] [place], [place], PA-C   1 year ago Depression, unspecified depression type   [facility] [place], Lavella [place], PA-C   1 year ago Annual physical exam   [facility] Trey Sailors, PA-C      Future Appointments            In 4 days Larae Grooms, NP [facility]

## 2021-01-16 ENCOUNTER — Other Ambulatory Visit: Payer: Self-pay

## 2021-01-16 ENCOUNTER — Ambulatory Visit: Payer: Managed Care, Other (non HMO) | Admitting: Nurse Practitioner

## 2021-01-16 ENCOUNTER — Encounter: Payer: Self-pay | Admitting: Nurse Practitioner

## 2021-01-16 ENCOUNTER — Other Ambulatory Visit: Payer: Self-pay | Admitting: Nurse Practitioner

## 2021-01-16 VITALS — BP 122/84 | HR 86 | Ht 67.0 in | Wt 140.1 lb

## 2021-01-16 DIAGNOSIS — Z1211 Encounter for screening for malignant neoplasm of colon: Secondary | ICD-10-CM

## 2021-01-16 DIAGNOSIS — Z1231 Encounter for screening mammogram for malignant neoplasm of breast: Secondary | ICD-10-CM

## 2021-01-16 DIAGNOSIS — F32A Depression, unspecified: Secondary | ICD-10-CM

## 2021-01-16 DIAGNOSIS — G43719 Chronic migraine without aura, intractable, without status migrainosus: Secondary | ICD-10-CM

## 2021-01-16 DIAGNOSIS — F419 Anxiety disorder, unspecified: Secondary | ICD-10-CM | POA: Diagnosis not present

## 2021-01-16 MED ORDER — BUTALBITAL-APAP-CAFFEINE 50-325-40 MG PO TABS: 1.0000 | ORAL_TABLET | Freq: Four times a day (QID) | ORAL | 0 refills | Status: DC | PRN

## 2021-01-16 MED ORDER — TOPIRAMATE 25 MG PO TABS: 50.0000 mg | ORAL_TABLET | Freq: Every day | ORAL | 0 refills | Status: DC

## 2021-01-16 NOTE — Progress Notes (Signed)
BP 122/84 (BP Location: Left Arm, Patient Position: Sitting, Cuff Size: Normal)   Pulse 86   Ht 5\' 7"  (1.702 m)   Wt 140 lb 1.6 oz (63.5 kg)   LMP 12/20/2020 (Exact Date)   SpO2 98%   BMI 21.94 kg/m    Subjective:    Patient ID: 02/19/2021, female    DOB: March 29, 1970, 50 y.o.   MRN: [id number]  HPI: Monica Long is a 50 y.o. female  Chief Complaint  Patient presents with   Establish Care   Patient presents to clinic to establish care with new PCP.  Patient reports a history of High cholesterol, anxiety and depression (on Wellbutrin), migraines.  Patient denies a history of: Hypertension, Diabetes, Thyroid problems, Neurological problems, and Abdominal problems.   MIGRAINES Patient would like to discuss migraines today.  She has tried Maxalt which made her heart race and feel like she was on fire.  She has also tried tylenol, ibuprofen and Excedrin migraine.  She was also started on topamax which has not helped to prevent migraines.  Patient states she has a migraine about once weekly. She has nausea with them.    Active Ambulatory Problems    Diagnosis Date Noted   Varicose veins of leg with pain, bilateral 06/19/2016   Swelling of limb 06/19/2016   Pain of right thigh 09/15/2020   Intractable chronic migraine without aura and without status migrainosus 12/15/2020   Anxiety    Depression 02/08/2014   Resolved Ambulatory Problems    Diagnosis Date Noted   No Resolved Ambulatory Problems   Past Medical History:  Diagnosis Date   Hepatic steatosis    History of mammogram 01/07/2014; 05-09-15   History of Papanicolaou smear of cervix 01/07/2014   Hypercholesteremia    Hypertriglyceridemia    Kidney stone 02/2015   Peripheral vascular disease [facility])    Past Surgical History:  Procedure Laterality Date   CESAREAN SECTION  05/08/2001   x1   Family History  Problem Relation Age of Onset   Healthy Mother    Hypertension Father    Colon cancer Maternal  Grandmother 67      Review of Systems  Neurological:  Positive for headaches.  Psychiatric/Behavioral:  Positive for dysphoric mood. The patient is nervous/anxious.    Per HPI unless specifically indicated above     Objective:    BP 122/84 (BP Location: Left Arm, Patient Position: Sitting, Cuff Size: Normal)   Pulse 86   Ht 5\' 7"  (1.702 m)   Wt 140 lb 1.6 oz (63.5 kg)   LMP 12/20/2020 (Exact Date)   SpO2 98%   BMI 21.94 kg/m   Wt Readings from Last 3 Encounters:  01/16/21 140 lb 1.6 oz (63.5 kg)  12/15/20 145 lb 6.4 oz (66 kg)  09/15/20 157 lb 12.8 oz (71.6 kg)    Physical Exam Vitals and nursing note reviewed.  Constitutional:      General: She is not in acute distress.    Appearance: Normal appearance. She is normal weight. She is not ill-appearing, toxic-appearing or diaphoretic.  HENT:     Head: Normocephalic.     Right Ear: External ear normal.     Left Ear: External ear normal.     Nose: Nose normal.     Mouth/Throat:     Mouth: Mucous membranes are moist.     Pharynx: Oropharynx is clear.  Eyes:     General:        Right eye:  No discharge.        Left eye: No discharge.     Extraocular Movements: Extraocular movements intact.     Conjunctiva/sclera: Conjunctivae normal.     Pupils: Pupils are equal, round, and reactive to light.  Cardiovascular:     Rate and Rhythm: Normal rate and regular rhythm.     Heart sounds: No murmur heard. Pulmonary:     Effort: Pulmonary effort is normal. No respiratory distress.     Breath sounds: Normal breath sounds. No wheezing or rales.  Musculoskeletal:     Cervical back: Normal range of motion and neck supple.  Skin:    General: Skin is warm and dry.     Capillary Refill: Capillary refill takes less than 2 seconds.  Neurological:     General: No focal deficit present.     Mental Status: She is alert and oriented to person, place, and time. Mental status is at baseline.  Psychiatric:        Mood and Affect: Mood  normal.        Behavior: Behavior normal.        Thought Content: Thought content normal.        Judgment: Judgment normal.    Results for orders placed or performed during the hospital encounter of 12/13/19  Occult blood card to lab, stool Provider will collect  Result Value Ref Range   Fecal Occult Bld NEGATIVE NEGATIVE      Assessment & Plan:   Problem List Items Addressed This Visit       Cardiovascular and Mediastinum   Intractable chronic migraine without aura and without status migrainosus    Chronic, Not well controlled.  Will increase Topamax from 25mg  to 50mg .  If tolerating well after 1 week can increase to 75mg  daily.  Will change Maxalt to Fioricet for abortive treatment. Patient did not tolerate Maxalt felt like her heart was racing and that she was on fire.  Follow up in 3 weeks for reevaluation.      Relevant Medications   topiramate (TOPAMAX) 25 MG tablet   butalbital-acetaminophen-caffeine (FIORICET) 50-325-40 MG tablet     Other   Anxiety    Chronic.  Well controlled with wellbutrin 150mg  daily.  Patient feels like this is a good dose for her.  Will follow up at future visits.       Depression    Chronic.  Well controlled with wellbutrin 150mg  daily.  Patient feels like this is a good dose for her.  Will follow up at future visits.       Other Visit Diagnoses     Screening for colon cancer    -  Primary   Relevant Orders   Ambulatory referral to Gastroenterology   Encounter for screening mammogram for malignant neoplasm of breast       Relevant Orders   MM Digital Screening        Follow up plan: Return in about 3 weeks (around 02/06/2021) for migraines (Virtual).

## 2021-01-16 NOTE — Assessment & Plan Note (Signed)
Chronic.  Well controlled with wellbutrin 150mg  daily.  Patient feels like this is a good dose for her.  Will follow up at future visits.

## 2021-01-16 NOTE — Assessment & Plan Note (Signed)
Chronic, Not well controlled.  Will increase Topamax from 25mg  to 50mg .  If tolerating well after 1 week can increase to 75mg  daily.  Will change Maxalt to Fioricet for abortive treatment. Patient did not tolerate Maxalt felt like her heart was racing and that she was on fire.  Follow up in 3 weeks for reevaluation.

## 2021-01-18 ENCOUNTER — Other Ambulatory Visit: Payer: Self-pay | Admitting: Nurse Practitioner

## 2021-01-19 NOTE — Telephone Encounter (Signed)
Requested medication (s) are due for refill today:   Provider to review  Requested medication (s) are on the active medication list:   Yes  Future visit scheduled:   Yes   Last ordered: 01/16/2021 #90, 0 refill  Non delegated refill   Requested Prescriptions  Pending Prescriptions Disp Refills   topiramate (TOPAMAX) 25 MG tablet [Pharmacy Med Name: TOPIRAMATE 25 MG TABLET] 30 tablet     Sig: TAKE 1 TABLET (25 MG TOTAL) BY MOUTH DAILY.     Not Delegated - Neurology: Anticonvulsants - topiramate & zonisamide Failed - 01/18/2021  7:15 PM      Failed - This refill cannot be delegated      Failed - Cr in normal range and within 360 days    Creatinine, Ser  Date Value Ref Range Status  07/03/2019 0.76 0.57 - 1.00 mg/dL Final          Failed - CO2 in normal range and within 360 days    CO2  Date Value Ref Range Status  07/03/2019 21 20 - 29 mmol/L Final          Passed - Valid encounter within last 12 months    Recent Outpatient Visits           3 days ago Screening for colon cancer   [facility] Larae Grooms, NP   1 month ago Intractable chronic migraine without aura and without status migrainosus   [facility] McElwee, Lauren A, NP   4 months ago Pain of right thigh   [facility] McElwee, Lauren A, NP   8 months ago Depression, unspecified depression type   [facility] [place], [place], PA-C   1 year ago Depression, unspecified depression type   [facility] [place], Lavella Hammock, PA-C       Future Appointments             In 2 weeks Larae Grooms, NP [facility]

## 2021-01-19 NOTE — Telephone Encounter (Signed)
Fyi.

## 2021-01-30 ENCOUNTER — Telehealth: Payer: Self-pay

## 2021-01-30 ENCOUNTER — Other Ambulatory Visit: Payer: Self-pay

## 2021-01-30 DIAGNOSIS — Z1211 Encounter for screening for malignant neoplasm of colon: Secondary | ICD-10-CM

## 2021-01-30 MED ORDER — NA SULFATE-K SULFATE-MG SULF 17.5-3.13-1.6 GM/177ML PO SOLN: 1.0000 | Freq: Once | ORAL | 0 refills | Status: AC

## 2021-01-30 NOTE — Progress Notes (Signed)
Gastroenterology Pre-Procedure Review  Request Date: 02/24/21 Requesting Physician: Dr. Servando Snare  PATIENT REVIEW QUESTIONS: The patient responded to the following health history questions as indicated:    1. Are you having any GI issues? no 2. Do you have a personal history of Polyps? no 3. Do you have a family history of Colon Cancer or Polyps? yes (Maternal grandmother- colon cancer) 4. Diabetes Mellitus? no 5. Joint replacements in the past 12 months?no 6. Major health problems in the past 3 months?no 7. Any artificial heart valves, MVP, or defibrillator?no    MEDICATIONS & ALLERGIES:    Patient reports the following regarding taking any anticoagulation/antiplatelet therapy:   Plavix, Coumadin, Eliquis, Xarelto, Lovenox, Pradaxa, Brilinta, or Effient? no Aspirin? no  Patient confirms/reports the following medications:  Current Outpatient Medications  Medication Sig Dispense Refill   azelastine (ASTELIN) 0.1 % nasal spray Place into the nose.     azelastine (ASTELIN) 0.1 % nasal spray PLACE 1 SPRAY INTO BOTH NOSTRILS 2 (TWO) TIMES DAILY. USE IN EACH NOSTRIL AS DIRECTED 30 mL 6   Baclofen 5 MG TABS Take 1 tablet by mouth 3 (three) times daily.     buPROPion (WELLBUTRIN XL) 150 MG 24 hr tablet Take 1 tablet (150 mg total) by mouth daily. 90 tablet 1   butalbital-acetaminophen-caffeine (FIORICET) 50-325-40 MG tablet Take 1 tablet by mouth every 6 (six) hours as needed for headache. 14 tablet 0   drospirenone-ethinyl estradiol (NIKKI) 3-0.02 MG tablet Take 1 tablet by mouth daily. 84 tablet 1   fluticasone (FLONASE) 50 MCG/ACT nasal spray Place 2 sprays into both nostrils daily. 11.1 mL 1   lidocaine (LIDODERM) 5 % 1 patch daily.     naproxen (NAPROSYN) 500 MG tablet Take 500 mg by mouth 2 (two) times daily.     omeprazole (PRILOSEC) 40 MG capsule TAKE 1 CAPSULE BY MOUTH  DAILY 90 capsule 0   predniSONE (DELTASONE) 20 MG tablet Take 20 mg by mouth daily.     topiramate (TOPAMAX) 25 MG  tablet Take 2 tablets (50 mg total) by mouth daily. Take 2 tablets once daily.  If tolerating okay after one week increase to 3 tabs daily 90 tablet 0   No current facility-administered medications for this visit.    Patient confirms/reports the following allergies:  Allergies  Allergen Reactions   Amoxicillin     Severe vomiting     No orders of the defined types were placed in this encounter.   AUTHORIZATION INFORMATION Primary Insurance: 1D#: Group #:  Secondary Insurance: 1D#: Group #:  SCHEDULE INFORMATION: Date: 02/24/21 Time: Location: [facility]

## 2021-01-30 NOTE — Telephone Encounter (Signed)
Procedure scheduled for 02/24/21.

## 2021-01-30 NOTE — Telephone Encounter (Signed)
Patient is ready to schedule procedure. Clinical staff will follow up with patient. °

## 2021-02-01 MED ORDER — TOPIRAMATE 25 MG PO TABS: 50.0000 mg | ORAL_TABLET | Freq: Every day | ORAL | 0 refills | Status: DC

## 2021-02-06 ENCOUNTER — Ambulatory Visit: Payer: Managed Care, Other (non HMO) | Admitting: Nurse Practitioner

## 2021-02-08 NOTE — Telephone Encounter (Signed)
Pt scheduled for appt 12/16

## 2021-02-14 ENCOUNTER — Other Ambulatory Visit: Payer: Self-pay

## 2021-02-14 ENCOUNTER — Encounter: Payer: Self-pay | Admitting: Gastroenterology

## 2021-02-24 ENCOUNTER — Other Ambulatory Visit: Payer: Self-pay

## 2021-02-24 ENCOUNTER — Ambulatory Visit
Admission: RE | Admit: 2021-02-24 | Discharge: 2021-02-24 | Disposition: A | Discharge status: Home / Self Care | Payer: Managed Care, Other (non HMO) | Attending: Gastroenterology | Admitting: Gastroenterology

## 2021-02-24 ENCOUNTER — Encounter: Payer: Self-pay | Admitting: Nurse Practitioner

## 2021-02-24 ENCOUNTER — Encounter: Payer: Self-pay | Admitting: Emergency Medicine

## 2021-02-24 ENCOUNTER — Ambulatory Visit: Payer: Managed Care, Other (non HMO) | Admitting: Anesthesiology

## 2021-02-24 ENCOUNTER — Ambulatory Visit (INDEPENDENT_AMBULATORY_CARE_PROVIDER_SITE_OTHER): Payer: Managed Care, Other (non HMO)

## 2021-02-24 ENCOUNTER — Encounter: Payer: Self-pay | Admitting: Gastroenterology

## 2021-02-24 ENCOUNTER — Encounter
Admission: RE | Disposition: A | Discharge status: Home / Self Care | Payer: Self-pay | Source: Home / Self Care | Attending: Gastroenterology

## 2021-02-24 ENCOUNTER — Ambulatory Visit (INDEPENDENT_AMBULATORY_CARE_PROVIDER_SITE_OTHER)
Admission: EM | Admit: 2021-02-24 | Discharge: 2021-02-24 | Disposition: A | Discharge status: Home / Self Care | Payer: Managed Care, Other (non HMO) | Source: Home / Self Care

## 2021-02-24 DIAGNOSIS — I739 Peripheral vascular disease, unspecified: Secondary | ICD-10-CM | POA: Insufficient documentation

## 2021-02-24 DIAGNOSIS — F32A Depression, unspecified: Secondary | ICD-10-CM | POA: Diagnosis not present

## 2021-02-24 DIAGNOSIS — Z1211 Encounter for screening for malignant neoplasm of colon: Secondary | ICD-10-CM

## 2021-02-24 DIAGNOSIS — J690 Pneumonitis due to inhalation of food and vomit: Secondary | ICD-10-CM

## 2021-02-24 DIAGNOSIS — R519 Headache, unspecified: Secondary | ICD-10-CM | POA: Insufficient documentation

## 2021-02-24 DIAGNOSIS — R509 Fever, unspecified: Secondary | ICD-10-CM

## 2021-02-24 DIAGNOSIS — R059 Cough, unspecified: Secondary | ICD-10-CM

## 2021-02-24 DIAGNOSIS — Z20822 Contact with and (suspected) exposure to covid-19: Secondary | ICD-10-CM | POA: Insufficient documentation

## 2021-02-24 HISTORY — PX: COLONOSCOPY WITH PROPOFOL: SHX5780

## 2021-02-24 LAB — RESP PANEL BY RT-PCR (FLU A&B, COVID) ARPGX2
Influenza A by PCR: NEGATIVE
Influenza B by PCR: NEGATIVE
SARS Coronavirus 2 by RT PCR: NEGATIVE

## 2021-02-24 LAB — POCT PREGNANCY, URINE: Preg Test, Ur: NEGATIVE

## 2021-02-24 SURGERY — COLONOSCOPY WITH PROPOFOL
Anesthesia: General

## 2021-02-24 MED ORDER — STERILE WATER FOR IRRIGATION IR SOLN
Status: DC | PRN
  Administered 2021-02-24: 150 mL

## 2021-02-24 MED ORDER — ONDANSETRON HCL 4 MG PO TABS: 4.0000 mg | ORAL_TABLET | Freq: Four times a day (QID) | ORAL | 0 refills | Status: DC

## 2021-02-24 MED ORDER — ONDANSETRON 4 MG PO TBDP
4.0000 mg | ORAL_TABLET | Freq: Once | ORAL | Status: AC
  Administered 2021-02-24: 4 mg via ORAL

## 2021-02-24 MED ORDER — MOXIFLOXACIN HCL 400 MG PO TABS: 400.0000 mg | ORAL_TABLET | Freq: Every day | ORAL | 0 refills | Status: AC

## 2021-02-24 MED ORDER — LIDOCAINE HCL (CARDIAC) PF 100 MG/5ML IV SOSY
PREFILLED_SYRINGE | INTRAVENOUS | Status: DC | PRN
  Administered 2021-02-24: 50 mg via INTRAVENOUS

## 2021-02-24 MED ORDER — PROPOFOL 10 MG/ML IV BOLUS
INTRAVENOUS | Status: DC | PRN
  Administered 2021-02-24: 40 mg via INTRAVENOUS
  Administered 2021-02-24: 150 mg via INTRAVENOUS
  Administered 2021-02-24: 40 mg via INTRAVENOUS
  Administered 2021-02-24: 50 mg via INTRAVENOUS
  Administered 2021-02-24 (×2): 40 mg via INTRAVENOUS

## 2021-02-24 MED ORDER — LACTATED RINGERS IV SOLN: INTRAVENOUS | Status: DC

## 2021-02-24 SURGICAL SUPPLY — 22 items

## 2021-02-24 NOTE — Transfer of Care (Signed)
Immediate Anesthesia Transfer of Care Note  Patient: Monica Long Record  Procedure(s) Performed: COLONOSCOPY WITH PROPOFOL  Patient Location: PACU  Anesthesia Type: General  Level of Consciousness: awake, alert  and patient cooperative  Airway and Oxygen Therapy: Patient Spontanous Breathing and Patient connected to supplemental oxygen  Post-op Assessment: Post-op Vital signs reviewed, Patient's Cardiovascular Status Stable, Respiratory Function Stable, Patent Airway and No signs of Nausea or vomiting  Post-op Vital Signs: Reviewed and stable  Complications: No notable events documented.

## 2021-02-24 NOTE — H&P (Signed)
Midge Minium, MD [facility] [address] [place], [place] [address] Phone: [phone] Fax : [phone]  Primary Care Physician:  Larae Grooms, NP Primary Gastroenterologist:  Dr. Servando Snare  Pre-Procedure History & Physical: HPI:  Monica Long is a 50 y.o. female is here for a screening colonoscopy.   Past Medical History:  Diagnosis Date   Anxiety    panic attack   Depression 02/08/2014   Hepatic steatosis    History of mammogram 01/07/2014; 05-09-15   birad 2; neg   History of Papanicolaou smear of cervix 01/07/2014   -/-   Hypercholesteremia    Hypertriglyceridemia    Kidney stone 02/2015   Peripheral vascular disease [facility])     Past Surgical History:  Procedure Laterality Date   CESAREAN SECTION  05/08/2001   x1    Prior to Admission medications   Medication Sig Start Date End Date Taking? Authorizing Provider  azelastine (ASTELIN) 0.1 % nasal spray PLACE 1 SPRAY INTO BOTH NOSTRILS 2 (TWO) TIMES DAILY. USE IN EACH NOSTRIL AS DIRECTED 10/23/19  Yes Osvaldo Angst M, PA-C  Baclofen 5 MG TABS Take 1 tablet by mouth 3 (three) times daily. 01/18/21  Yes [provider]  buPROPion (WELLBUTRIN XL) 150 MG 24 hr tablet Take 1 tablet (150 mg total) by mouth daily. 11/15/20  Yes Malva Limes, MD  butalbital-acetaminophen-caffeine (FIORICET) [phone] MG tablet Take 1 tablet by mouth every 6 (six) hours as needed for headache. 01/16/21  Yes Larae Grooms, NP  fluticasone (FLONASE) 50 MCG/ACT nasal spray Place 2 sprays into both nostrils daily. 07/24/19  Yes Osvaldo Angst M, PA-C  lidocaine (LIDODERM) 5 % 1 patch daily. 01/18/21  Yes [provider]  naproxen (NAPROSYN) 500 MG tablet Take 500 mg by mouth 2 (two) times daily. 01/18/21  Yes [provider]  Omega-3 1000 MG CAPS Take by mouth.   Yes [provider]  azelastine (ASTELIN) 0.1 % nasal spray Place into the nose. 10/23/19   [provider]  drospirenone-ethinyl  estradiol (NIKKI) 3-0.02 MG tablet Take 1 tablet by mouth daily. Patient not taking: Reported on 02/14/2021 09/28/19   Trey Sailors, PA-C  omeprazole (PRILOSEC) 40 MG capsule TAKE 1 CAPSULE BY MOUTH  DAILY Patient not taking: Reported on 02/14/2021 01/12/21   Malva Limes, MD  predniSONE (DELTASONE) 20 MG tablet Take 20 mg by mouth daily. Patient not taking: Reported on 02/14/2021 01/26/21   [provider]  topiramate (TOPAMAX) 25 MG tablet Take 2 tablets (50 mg total) by mouth daily. Take 2 tablets once daily.  If tolerating okay after one week increase to 3 tabs daily Patient not taking: Reported on 02/24/2021 02/01/21   Larae Grooms, NP  Azelastine-Fluticasone 137-50 MCG/ACT SUSP Place 1 spray into the nose 2 (two) times daily. 04/22/18 03/09/19  Trey Sailors, PA-C  hydrochlorothiazide (MICROZIDE) 12.5 MG capsule Take 1 capsule (12.5 mg total) by mouth daily. 11/01/17 03/09/19  Trey Sailors, PA-C  sertraline (ZOLOFT) 50 MG tablet TAKE 1 TABLET BY MOUTH EVERY DAY 05/07/19 07/09/19  Trey Sailors, PA-C    Allergies as of 01/30/2021 - Review Complete 01/16/2021  Allergen Reaction Noted   Amoxicillin  05/26/2018    Family History  Problem Relation Age of Onset   Healthy Mother    Hypertension Father    Colon cancer Maternal Grandmother 71    Social History   Socioeconomic History   Marital status: Single    Spouse name: Not on file  Number of children: 1   Years of education: 12   Highest education level: Not on file  Occupational History    Employer: [employer]  Tobacco Use   Smoking status: Never   Smokeless tobacco: Never  Vaping Use   Vaping Use: Never used  Substance and Sexual Activity   Alcohol use: No    Alcohol/week: 0.0 standard drinks   Drug use: No   Sexual activity: Yes    Birth control/protection: Pill  Other Topics Concern   Not on file  Social History Narrative   Not on file   Social Determinants of Health   Financial  Resource Strain: Not on file  Food Insecurity: Not on file  Transportation Needs: Not on file  Physical Activity: Not on file  Stress: Not on file  Social Connections: Not on file  Intimate Partner Violence: Not on file    Review of Systems: See HPI, otherwise negative ROS  Physical Exam: BP (!) 143/75   Pulse 93   Temp (!) 97.3 F (36.3 C) (Temporal)   Ht 5\' 7"  (1.702 m)   Wt 61.6 kg   LMP 12/20/2020 Comment: preg test neg  SpO2 99%   BMI 21.25 kg/m  General:   Alert,  pleasant and cooperative in NAD Head:  Normocephalic and atraumatic. Neck:  Supple; no masses or thyromegaly. Lungs:  Clear throughout to auscultation.    Heart:  Regular rate and rhythm. Abdomen:  Soft, nontender and nondistended. Normal bowel sounds, without guarding, and without rebound.   Neurologic:  Alert and  oriented x4;  grossly normal neurologically.  Impression/Plan: Monica Long is now here to undergo a screening colonoscopy.  Risks, benefits, and alternatives regarding colonoscopy have been reviewed with the patient.  Questions have been answered.  All parties agreeable.

## 2021-02-24 NOTE — ED Triage Notes (Signed)
Patient states that she had her colonoscopy around 7 this morning.  Patient states that when she woke up she had a cough and sore throat.  Patient states that later today she became nauseous and had a severe headache.  Patient also reports fever 100.8 today.

## 2021-02-24 NOTE — Anesthesia Procedure Notes (Signed)
Date/Time: 02/24/2021 7:51 AM Performed by: Maree Krabbe, CRNA Pre-anesthesia Checklist: Patient identified, Emergency Drugs available, Suction available, Timeout performed and Patient being monitored Patient Re-evaluated:Patient Re-evaluated prior to induction Oxygen Delivery Method: Nasal cannula Placement Confirmation: positive ETCO2

## 2021-02-24 NOTE — Op Note (Signed)
[facility] Gastroenterology Patient Name: Monica Long Procedure Date: 02/24/2021 7:11 AM MRN: [id number] Account #: [id number] Date of Birth: August 28, 1970 Admit Type: Outpatient Age: 50 Room: [facility] OR ROOM 01 Gender: Female Note Status: Finalized Instrument Name: [id number] Procedure:             Colonoscopy Indications:           Screening for colorectal malignant neoplasm Providers:             Midge Minium MD, MD Referring MD:          Larae Grooms (Referring MD) Medicines:             Propofol per Anesthesia Complications:         No immediate complications. Procedure:             Pre-Anesthesia Assessment:                        - Prior to the procedure, a History and Physical was                         performed, and patient medications and allergies were                         reviewed. The patient's tolerance of previous                         anesthesia was also reviewed. The risks and benefits                         of the procedure and the sedation options and risks                         were discussed with the patient. All questions were                         answered, and informed consent was obtained. Prior                         Anticoagulants: The patient has taken no previous                         anticoagulant or antiplatelet agents. ASA Grade                         Assessment: II - A patient with mild systemic disease.                         After reviewing the risks and benefits, the patient                         was deemed in satisfactory condition to undergo the                         procedure.                        After obtaining informed consent, the colonoscope was  passed under direct vision. Throughout the procedure,                         the patient's blood pressure, pulse, and oxygen                         saturations were monitored continuously. The was                         introduced  through the anus and advanced to the the                         cecum, identified by appendiceal orifice and ileocecal                         valve. The colonoscopy was performed without                         difficulty. The patient tolerated the procedure well.                         The quality of the bowel preparation was excellent. Findings:      The perianal and digital rectal examinations were normal.      The colon (entire examined portion) appeared normal. Impression:            - The entire examined colon is normal.                        - No specimens collected. Recommendation:        - Discharge patient to home.                        - Resume previous diet.                        - Continue present medications.                        - Repeat colonoscopy in 10 years for screening                         purposes. Procedure Code(s):     --- Professional ---                        [phone], Colonoscopy, flexible; diagnostic, including                         collection of specimen(s) by brushing or washing, when                         performed (separate procedure) Diagnosis Code(s):     --- Professional ---                        Z12.11, Encounter for screening for malignant neoplasm                         of colon CPT copyright 2019 American Medical Association. All rights reserved. The codes documented in this report are preliminary and  upon coder review may  be revised to meet current compliance requirements. Midge Minium MD, MD 02/24/2021 8:07:09 AM This report has been signed electronically. Number of Addenda: 0 Note Initiated On: 02/24/2021 7:11 AM Scope Withdrawal Time: 0 hours 6 minutes 30 seconds  Total Procedure Duration: 0 hours 10 minutes 54 seconds  Estimated Blood Loss:  Estimated blood loss: none.      [facility]

## 2021-02-24 NOTE — Anesthesia Preprocedure Evaluation (Signed)
Anesthesia Evaluation  Patient identified by MRN, date of birth, ID band Patient awake    Reviewed: Allergy & Precautions, H&P , NPO status , Patient's Chart, lab work & pertinent test results  Airway Mallampati: II  TM Distance: >3 FB Neck ROM: full    Dental no notable dental hx.    Pulmonary    Pulmonary exam normal breath sounds clear to auscultation       Cardiovascular + Peripheral Vascular Disease  Normal cardiovascular exam Rhythm:regular Rate:Normal     Neuro/Psych  Headaches, Depression    GI/Hepatic   Endo/Other    Renal/GU      Musculoskeletal   Abdominal   Peds  Hematology   Anesthesia Other Findings   Reproductive/Obstetrics                             Anesthesia Physical Anesthesia Plan  ASA: 2  Anesthesia Plan: General   Post-op Pain Management: Minimal or no pain anticipated   Induction: Intravenous  PONV Risk Score and Plan: 3 and Treatment may vary due to age or medical condition, Propofol infusion and TIVA  Airway Management Planned: Natural Airway  Additional Equipment:   Intra-op Plan:   Post-operative Plan:   Informed Consent: I have reviewed the patients History and Physical, chart, labs and discussed the procedure including the risks, benefits and alternatives for the proposed anesthesia with the patient or authorized representative who has indicated his/her understanding and acceptance.     Dental Advisory Given  Plan Discussed with: CRNA  Anesthesia Plan Comments:         Anesthesia Quick Evaluation

## 2021-02-24 NOTE — Anesthesia Postprocedure Evaluation (Signed)
Anesthesia Post Note  Patient: Monica Long  Procedure(s) Performed: COLONOSCOPY WITH PROPOFOL     Patient location during evaluation: PACU Anesthesia Type: General Level of consciousness: awake and alert and oriented Pain management: satisfactory to patient Vital Signs Assessment: post-procedure vital signs reviewed and stable Respiratory status: spontaneous breathing, nonlabored ventilation and respiratory function stable Cardiovascular status: blood pressure returned to baseline and stable Postop Assessment: Adequate PO intake and No signs of nausea or vomiting Anesthetic complications: no   No notable events documented.  Cherly [place]

## 2021-02-24 NOTE — ED Provider Notes (Signed)
[facility]    CSN: [id number] Arrival date & time: 02/24/21  1653      History   Chief Complaint Chief Complaint  Patient presents with   Headache   Cough    HPI Monica Long is a 50 y.o. female.   HPI  Cold Symptoms: Patient states that she had a colonoscopy around 7 this morning.  She states that when she woke up from the colonoscopy to which she was not intubated for she noticed that she had a sore throat and a bit of a cough.  She then developed some nausea and headache along with a fever with T-max of 100.8 F. She has taken Tylenol which has helped some.  She states that this is the first time that she has had any type of anesthesia but has no family history of intolerance to anesthesia. She denies chest pain, SOB or abdominal pain. She has urinated and had one normal BM since her procedure. No known sick contacts.   Past Medical History:  Diagnosis Date   Anxiety    panic attack   Depression 02/08/2014   Hepatic steatosis    History of mammogram 01/07/2014; 05-09-15   birad 2; neg   History of Papanicolaou smear of cervix 01/07/2014   -/-   Hypercholesteremia    Hypertriglyceridemia    Kidney stone 02/2015   Peripheral vascular disease [facility])     Patient Active Problem List   Diagnosis Date Noted   Special screening for malignant neoplasms, colon    Anxiety    Intractable chronic migraine without aura and without status migrainosus 12/15/2020   Pain of right thigh 09/15/2020   Varicose veins of leg with pain, bilateral 06/19/2016   Swelling of limb 06/19/2016   Depression 02/08/2014    Past Surgical History:  Procedure Laterality Date   CESAREAN SECTION  05/08/2001   x1    OB History     Gravida  1   Para  1   Term      Preterm  1   AB      Living  1      SAB      IAB      Ectopic      Multiple      Live Births  1            Home Medications    Prior to Admission medications   Medication Sig Start Date  End Date Taking? Authorizing Provider  buPROPion (WELLBUTRIN XL) 150 MG 24 hr tablet Take 1 tablet (150 mg total) by mouth daily. 11/15/20  Yes Malva Limes, MD  azelastine (ASTELIN) 0.1 % nasal spray PLACE 1 SPRAY INTO BOTH NOSTRILS 2 (TWO) TIMES DAILY. USE IN EACH NOSTRIL AS DIRECTED 10/23/19   Trey Sailors, PA-C  azelastine (ASTELIN) 0.1 % nasal spray Place into the nose. 10/23/19   [provider]  Baclofen 5 MG TABS Take 1 tablet by mouth 3 (three) times daily. 01/18/21   [provider]  butalbital-acetaminophen-caffeine (FIORICET) 50-325-40 MG tablet Take 1 tablet by mouth every 6 (six) hours as needed for headache. 01/16/21   Larae Grooms, NP  drospirenone-ethinyl estradiol (NIKKI) 3-0.02 MG tablet Take 1 tablet by mouth daily. Patient not taking: Reported on 02/14/2021 09/28/19   Trey Sailors, PA-C  fluticasone [facility]) 50 MCG/ACT nasal spray Place 2 sprays into both nostrils daily. 07/24/19   Trey Sailors, PA-C  lidocaine (LIDODERM) 5 % 1 patch daily.  01/18/21   [provider]  naproxen (NAPROSYN) 500 MG tablet Take 500 mg by mouth 2 (two) times daily. 01/18/21   [provider]  Omega-3 1000 MG CAPS Take by mouth.    [provider]  omeprazole (PRILOSEC) 40 MG capsule TAKE 1 CAPSULE BY MOUTH  DAILY Patient not taking: Reported on 02/14/2021 01/12/21   Malva Limes, MD  predniSONE (DELTASONE) 20 MG tablet Take 20 mg by mouth daily. Patient not taking: Reported on 02/14/2021 01/26/21   [provider]  topiramate (TOPAMAX) 25 MG tablet Take 2 tablets (50 mg total) by mouth daily. Take 2 tablets once daily.  If tolerating okay after one week increase to 3 tabs daily Patient not taking: Reported on 02/24/2021 02/01/21   Larae Grooms, NP  Azelastine-Fluticasone 137-50 MCG/ACT SUSP Place 1 spray into the nose 2 (two) times daily. 04/22/18 03/09/19  Trey Sailors, PA-C  hydrochlorothiazide (MICROZIDE) 12.5 MG  capsule Take 1 capsule (12.5 mg total) by mouth daily. 11/01/17 03/09/19  Trey Sailors, PA-C  sertraline (ZOLOFT) 50 MG tablet TAKE 1 TABLET BY MOUTH EVERY DAY 05/07/19 07/09/19  Trey Sailors, PA-C    Family History Family History  Problem Relation Age of Onset   Healthy Mother    Hypertension Father    Colon cancer Maternal Grandmother 59    Social History Social History   Tobacco Use   Smoking status: Never   Smokeless tobacco: Never  Vaping Use   Vaping Use: Never used  Substance Use Topics   Alcohol use: No    Alcohol/week: 0.0 standard drinks   Drug use: No     Allergies   Amoxicillin   Review of Systems Review of Systems  As stated above in HPI Physical Exam Triage Vital Signs ED Triage Vitals  Enc Vitals Group     BP 02/24/21 1835 (!) 105/58     Pulse Rate 02/24/21 1835 93     Resp 02/24/21 1835 14     Temp 02/24/21 1835 99.6 F (37.6 C)     Temp Source 02/24/21 1835 Oral     SpO2 02/24/21 1835 100 %     Weight 02/24/21 1830 136 lb (61.7 kg)     Height 02/24/21 1830 5\' 7"  (1.702 m)     Head Circumference --      Peak Flow --      Pain Score 02/24/21 1830 7     Pain Loc --      Pain Edu? --      Excl. in GC? --    No data found.  Updated Vital Signs BP (!) 105/58 (BP Location: Left Arm)   Pulse 93   Temp 99.6 F (37.6 C) (Oral)   Resp 14   Ht 5\' 7"  (1.702 m)   Wt 136 lb (61.7 kg)   SpO2 100%   BMI 21.30 kg/m   Visual Acuity Right Eye Distance:   Left Eye Distance:   Bilateral Distance:    Right Eye Near:   Left Eye Near:    Bilateral Near:     Physical Exam   UC Treatments / Results  Labs (all labs ordered are listed, but only abnormal results are displayed) Labs Reviewed  RESP PANEL BY RT-PCR (FLU A&B, COVID) [id number]    EKG   Radiology No results found.  Procedures Procedures (including critical care time)  Medications Ordered in UC Medications  ondansetron (ZOFRAN-ODT) disintegrating tablet 4 mg (4 mg  Oral Given  02/24/21 1838)    Initial Impression / Assessment and Plan / UC Course  I have reviewed the triage vital signs and the nursing notes.  Pertinent labs & imaging results that were available during my care of the patient were reviewed by me and considered in my medical decision making (see chart for details).     New.  Negative for COVID-19 and influenza.  Chest x-ray positive for aspiration pneumonia.  Given her amoxicillin allergy I am going to treat her with moxifloxacin which is recommended per up-to-date.  Discussed red flag signs and symptoms along with blackbox warnings and how to take this medication.  She will follow-up with her PCP early next week.  Low threshold for going to the emergency room. Rest and hydration encouraged.  Final Clinical Impressions(s) / UC Diagnoses   Final diagnoses:  None   Discharge Instructions   None    ED Prescriptions   None    PDMP not reviewed this encounter.   Rushie Chestnut, [place] 02/24/21 1954

## 2021-02-26 ENCOUNTER — Other Ambulatory Visit: Payer: Self-pay | Admitting: Nurse Practitioner

## 2021-02-26 NOTE — Telephone Encounter (Signed)
Requested medication (s) are due for refill today: -  Requested medication (s) are on the active medication list: yes  Last refill:  02/01/21 #90  Future visit scheduled: yes  Notes to clinic:  med not delegated to NT to RF   Requested Prescriptions  Pending Prescriptions Disp Refills   topiramate (TOPAMAX) 25 MG tablet [Pharmacy Med Name: TOPIRAMATE 25 MG TABLET] 90 tablet 0    Sig: TAKE 2 TABLETS ONCE DAILY. IF TOLERATING OKAY AFTER ONE WEEK INCREASE TO 3 TABS DAILY     Not Delegated - Neurology: Anticonvulsants - topiramate & zonisamide Failed - 02/26/2021 10:31 AM      Failed - This refill cannot be delegated      Failed - Cr in normal range and within 360 days    Creatinine, Ser  Date Value Ref Range Status  07/03/2019 0.76 0.57 - 1.00 mg/dL Final          Failed - CO2 in normal range and within 360 days    CO2  Date Value Ref Range Status  07/03/2019 21 20 - 29 mmol/L Final          Passed - Valid encounter within last 12 months    Recent Outpatient Visits           1 month ago Screening for colon cancer   [facility] Larae Grooms, NP   2 months ago Intractable chronic migraine without aura and without status migrainosus   [facility] McElwee, Lauren A, NP   5 months ago Pain of right thigh   [facility] McElwee, Lauren A, NP   9 months ago Depression, unspecified depression type   [facility] [place], [place], PA-C   1 year ago Depression, unspecified depression type   [facility] [place], Lavella [place], [place]       Future Appointments             Tomorrow Larae Grooms, NP [facility], [facility]   In 2 weeks Larae Grooms, NP [facility], [facility]

## 2021-02-27 ENCOUNTER — Encounter: Payer: Self-pay | Admitting: Nurse Practitioner

## 2021-02-27 ENCOUNTER — Ambulatory Visit: Payer: Managed Care, Other (non HMO) | Admitting: Nurse Practitioner

## 2021-02-27 ENCOUNTER — Encounter: Payer: Self-pay | Admitting: Gastroenterology

## 2021-02-27 ENCOUNTER — Ambulatory Visit
Admission: RE | Admit: 2021-02-27 | Discharge: 2021-02-27 | Disposition: A | Discharge status: Home / Self Care | Payer: Managed Care, Other (non HMO) | Attending: Nurse Practitioner | Admitting: Nurse Practitioner

## 2021-02-27 ENCOUNTER — Ambulatory Visit
Admission: RE | Admit: 2021-02-27 | Discharge: 2021-02-27 | Disposition: A | Discharge status: Home / Self Care | Payer: Managed Care, Other (non HMO) | Source: Ambulatory Visit | Attending: Nurse Practitioner | Admitting: Nurse Practitioner

## 2021-02-27 ENCOUNTER — Other Ambulatory Visit: Payer: Self-pay

## 2021-02-27 VITALS — BP 117/74 | HR 72 | Temp 97.6°F | Wt 137.8 lb

## 2021-02-27 DIAGNOSIS — J690 Pneumonitis due to inhalation of food and vomit: Secondary | ICD-10-CM | POA: Insufficient documentation

## 2021-02-27 NOTE — Progress Notes (Signed)
BP 117/74   Pulse 72   Temp 97.6 F (36.4 C) (Oral)   Wt 137 lb 12.8 oz (62.5 kg)   SpO2 100%   BMI 21.58 kg/m    Subjective:    Patient ID: Monica Long, female    DOB: 03-Jan-1971, 50 y.o.   MRN: [id number]  HPI: Monica Long is a 50 y.o. female  Chief Complaint  Patient presents with   Pneumonia    Pt states she went to [facility] Friday night, states she was diagnosed with aspiration pneumonia. States she was in for a Colonoscopy Friday morning and was coughing during and after the procedure.     Patient states she was seen in [facility] Friday night due to coughing, sore throat, and fever.  She was having a lot of nausea. She had infiltrates in the left lung. Today she feels like the fever resolved and she is no longer nauseous.  She has taken the Moxifloxacin for 3/5 days. She is coughing more now and feels more short of breath.    Relevant past medical, surgical, family and social history reviewed and updated as indicated. Interim medical history since our last visit reviewed. Allergies and medications reviewed and updated.  Review of Systems  Constitutional:  Negative for fever.  Respiratory:  Positive for cough and shortness of breath.    Per HPI unless specifically indicated above     Objective:    BP 117/74   Pulse 72   Temp 97.6 F (36.4 C) (Oral)   Wt 137 lb 12.8 oz (62.5 kg)   SpO2 100%   BMI 21.58 kg/m   Wt Readings from Last 3 Encounters:  02/27/21 137 lb 12.8 oz (62.5 kg)  02/24/21 136 lb (61.7 kg)  02/24/21 135 lb 11.2 oz (61.6 kg)    Physical Exam Vitals and nursing note reviewed.  Constitutional:      General: She is not in acute distress.    Appearance: Normal appearance. She is normal weight. She is not ill-appearing, toxic-appearing or diaphoretic.  HENT:     Head: Normocephalic.     Right Ear: External ear normal.     Left Ear: External ear normal.     Nose: Nose normal.     Mouth/Throat:     Mouth: Mucous membranes are moist.     Pharynx:  Oropharynx is clear.  Eyes:     General:        Right eye: No discharge.        Left eye: No discharge.     Extraocular Movements: Extraocular movements intact.     Conjunctiva/sclera: Conjunctivae normal.     Pupils: Pupils are equal, round, and reactive to light.  Cardiovascular:     Rate and Rhythm: Normal rate and regular rhythm.     Heart sounds: No murmur heard. Pulmonary:     Effort: Pulmonary effort is normal. No respiratory distress.     Breath sounds: Normal breath sounds. No wheezing or rales.  Musculoskeletal:     Cervical back: Normal range of motion and neck supple.  Skin:    General: Skin is warm and dry.     Capillary Refill: Capillary refill takes less than 2 seconds.  Neurological:     General: No focal deficit present.     Mental Status: She is alert and oriented to person, place, and time. Mental status is at baseline.  Psychiatric:        Mood and Affect: Mood normal.  Behavior: Behavior normal.        Thought Content: Thought content normal.        Judgment: Judgment normal.    Results for orders placed or performed during the hospital encounter of 02/24/21  Resp Panel by RT-PCR (Flu A&B, Covid) Nasopharyngeal Swab   Specimen: Nasopharyngeal Swab; Nasopharyngeal(NP) swabs in vial transport medium  Result Value Ref Range   SARS Coronavirus 2 by RT PCR NEGATIVE NEGATIVE   Influenza A by PCR NEGATIVE NEGATIVE   Influenza B by PCR NEGATIVE NEGATIVE      Assessment & Plan:   Problem List Items Addressed This Visit       Respiratory   Aspiration pneumonia of left lung due to vomit ([place]) - Primary    Ongoing. Patient's cough and SOB has worsened.  Will recheck chest xray and lab work.  Continue with Moxafloxacin.  Will make further recommendations based on results.       Relevant Orders   CBC w/Diff   Comp Met (CMET)   DG Chest 2 View     Follow up plan: Return if symptoms worsen or fail to improve.   A total of 30 minutes were spent on  this encounter today.  When total time is documented, this includes both the face-to-face and non-face-to-face time personally spent before, during and after the visit on the date of the encounter reviewing urgent care visit, developing plan of care and ordering tests.

## 2021-02-27 NOTE — Assessment & Plan Note (Signed)
Ongoing. Patient's cough and SOB has worsened.  Will recheck chest xray and lab work.  Continue with Moxafloxacin.  Will make further recommendations based on results.

## 2021-02-28 LAB — COMPREHENSIVE METABOLIC PANEL
ALT: 9 IU/L (ref 0–32)
AST: 12 IU/L (ref 0–40)
Albumin/Globulin Ratio: 1.7 (ref 1.2–2.2)
Albumin: 4.4 g/dL (ref 3.8–4.8)
Alkaline Phosphatase: 66 IU/L (ref 44–121)
BUN/Creatinine Ratio: 22 (ref 9–23)
BUN: 16 mg/dL (ref 6–24)
Bilirubin Total: <0.2 mg/dL (ref 0.0–1.2)
CO2: 24 mmol/L (ref 20–29)
Calcium: 9.9 mg/dL (ref 8.7–10.2)
Chloride: 100 mmol/L (ref 96–106)
Creatinine, Ser: 0.72 mg/dL (ref 0.57–1.00)
Globulin, Total: 2.6 g/dL (ref 1.5–4.5)
Glucose: 96 mg/dL (ref 70–99)
Potassium: 4.1 mmol/L (ref 3.5–5.2)
Sodium: 142 mmol/L (ref 134–144)
Total Protein: 7 g/dL (ref 6.0–8.5)
eGFR: 102 mL/min/{1.73_m2} (ref >59)

## 2021-02-28 LAB — CBC WITH DIFFERENTIAL/PLATELET
Basophils Absolute: 0.1 10*3/uL (ref 0.0–0.2)
Basos: 1 %
EOS (ABSOLUTE): 0.3 10*3/uL (ref 0.0–0.4)
Eos: 4 %
Hematocrit: 40.6 % (ref 34.0–46.6)
Hemoglobin: 13.9 g/dL (ref 11.1–15.9)
Immature Grans (Abs): 0 10*3/uL (ref 0.0–0.1)
Immature Granulocytes: 0 %
Lymphocytes Absolute: 2.9 10*3/uL (ref 0.7–3.1)
Lymphs: 36 %
MCH: 28.7 pg (ref 26.6–33.0)
MCHC: 34.2 g/dL (ref 31.5–35.7)
MCV: 84 fL (ref 79–97)
Monocytes Absolute: 0.5 10*3/uL (ref 0.1–0.9)
Monocytes: 7 %
Neutrophils Absolute: 4.3 10*3/uL (ref 1.4–7.0)
Neutrophils: 52 %
Platelets: 275 10*3/uL (ref 150–450)
RBC: 4.84 x10E6/uL (ref 3.77–5.28)
RDW: 13.6 % (ref 11.7–15.4)
WBC: 8.1 10*3/uL (ref 3.4–10.8)

## 2021-02-28 NOTE — Progress Notes (Signed)
Hi Dorna. Your chest xray shows that the pneumonia is resolving.  We will continue with the current treatment plan.  Please let me know if your symptoms do not improve.

## 2021-02-28 NOTE — Progress Notes (Signed)
Hi [name]. Your lab work looks good.  Please let me know if you have any questions.

## 2021-03-01 ENCOUNTER — Encounter: Payer: Self-pay | Admitting: Nurse Practitioner

## 2021-03-03 ENCOUNTER — Ambulatory Visit: Payer: Managed Care, Other (non HMO) | Admitting: Nurse Practitioner

## 2021-03-04 ENCOUNTER — Encounter: Payer: Self-pay | Admitting: Nurse Practitioner

## 2021-03-06 ENCOUNTER — Encounter: Payer: Self-pay | Admitting: Nurse Practitioner

## 2021-03-07 MED ORDER — ALBUTEROL SULFATE HFA 108 (90 BASE) MCG/ACT IN AERS: 2.0000 | INHALATION_SPRAY | Freq: Four times a day (QID) | RESPIRATORY_TRACT | 0 refills | Status: DC | PRN

## 2021-03-17 ENCOUNTER — Ambulatory Visit: Payer: Managed Care, Other (non HMO) | Admitting: Nurse Practitioner

## 2021-03-28 ENCOUNTER — Ambulatory Visit
Admission: RE | Admit: 2021-03-28 | Discharge: 2021-03-28 | Disposition: A | Discharge status: Home / Self Care | Payer: Managed Care, Other (non HMO) | Source: Ambulatory Visit | Attending: Nurse Practitioner | Admitting: Nurse Practitioner

## 2021-03-28 ENCOUNTER — Other Ambulatory Visit: Payer: Self-pay

## 2021-03-28 DIAGNOSIS — Z1231 Encounter for screening mammogram for malignant neoplasm of breast: Secondary | ICD-10-CM | POA: Insufficient documentation

## 2021-03-29 ENCOUNTER — Other Ambulatory Visit: Payer: Self-pay | Admitting: Nurse Practitioner

## 2021-03-29 NOTE — Telephone Encounter (Signed)
Requested Prescriptions  Pending Prescriptions Disp Refills   albuterol (VENTOLIN HFA) 108 (90 Base) MCG/ACT inhaler [Pharmacy Med Name: ALBUTEROL HFA (PROAIR) INHALER] 8.5 each     Sig: TAKE 2 PUFFS BY MOUTH EVERY 6 HOURS AS NEEDED FOR WHEEZE OR SHORTNESS OF BREATH     Pulmonology:  Beta Agonists Failed - 03/29/2021  1:22 AM      Failed - One inhaler should last at least one month. If the patient is requesting refills earlier, contact the patient to check for uncontrolled symptoms.      Passed - Valid encounter within last 12 months    Recent Outpatient Visits          1 month ago Aspiration pneumonia of left lung due to vomit, unspecified part of lung (HCC)   [facility] Larae Grooms, NP   2 months ago Screening for colon cancer   [facility] Larae Grooms, NP   3 months ago Intractable chronic migraine without aura and without status migrainosus   [facility] McElwee, Lauren A, NP   6 months ago Pain of right thigh   [facility] McElwee, Lauren A, NP   10 months ago Depression, unspecified depression type   [facility] Trey Sailors, PA-C      Future Appointments            In 1 week Larae Grooms, NP [facility], [facility]

## 2021-03-30 ENCOUNTER — Other Ambulatory Visit: Payer: Self-pay | Admitting: Family Medicine

## 2021-03-30 DIAGNOSIS — F32A Depression, unspecified: Secondary | ICD-10-CM

## 2021-03-30 NOTE — Telephone Encounter (Signed)
Requested medications are due for refill today.  no  Requested medications are on the active medications list.  no  Last refill. 11/09/2020  Future visit scheduled.   yes  Notes to clinic.  Medication was discontinued on 12/15/2020.    Requested Prescriptions  Pending Prescriptions Disp Refills   escitalopram (LEXAPRO) 10 MG tablet [Pharmacy Med Name: Escitalopram Oxalate 10 MG Oral Tablet] 90 tablet 3    Sig: TAKE 1 TABLET BY MOUTH  DAILY     Psychiatry:  Antidepressants - SSRI Passed - 03/30/2021  4:44 AM      Passed - Completed PHQ-2 or PHQ-9 in the last 360 days      Passed - Valid encounter within last 6 months    Recent Outpatient Visits           1 month ago Aspiration pneumonia of left lung due to vomit, unspecified part of lung ([place])   [facility] Jon Billings, NP   2 months ago Screening for colon cancer   [facility] Jon Billings, NP   3 months ago Intractable chronic migraine without aura and without status migrainosus   [facility] McElwee, Lauren A, NP   6 months ago Pain of right thigh   [place], Lauren A, NP   10 months ago Depression, unspecified depression type   [place], Wendee [place], PA-C       Future Appointments             In 1 week Jon Billings, NP [facility], [place]

## 2021-04-04 NOTE — Progress Notes (Signed)
HI Monica Long.  Your Mammogram did not show any evidence of a malignancy.  The recommendation is to repeat the Mammogram in 1 year.

## 2021-04-07 ENCOUNTER — Ambulatory Visit: Payer: Managed Care, Other (non HMO) | Admitting: Nurse Practitioner

## 2021-04-21 ENCOUNTER — Encounter: Payer: Self-pay | Admitting: Nurse Practitioner

## 2021-04-21 ENCOUNTER — Other Ambulatory Visit: Payer: Self-pay

## 2021-04-21 ENCOUNTER — Ambulatory Visit: Payer: Managed Care, Other (non HMO) | Admitting: Nurse Practitioner

## 2021-04-21 VITALS — BP 105/73 | HR 73 | Temp 97.8°F | Wt 142.2 lb

## 2021-04-21 DIAGNOSIS — F32A Depression, unspecified: Secondary | ICD-10-CM

## 2021-04-21 DIAGNOSIS — R5383 Other fatigue: Secondary | ICD-10-CM | POA: Diagnosis not present

## 2021-04-21 DIAGNOSIS — Z309 Encounter for contraceptive management, unspecified: Secondary | ICD-10-CM | POA: Diagnosis not present

## 2021-04-21 DIAGNOSIS — R519 Headache, unspecified: Secondary | ICD-10-CM | POA: Diagnosis not present

## 2021-04-21 DIAGNOSIS — G8929 Other chronic pain: Secondary | ICD-10-CM

## 2021-04-21 MED ORDER — TOPIRAMATE 25 MG PO TABS: 50.0000 mg | ORAL_TABLET | Freq: Two times a day (BID) | ORAL | 1 refills | Status: DC

## 2021-04-21 MED ORDER — DROSPIRENONE-ETHINYL ESTRADIOL 3-0.02 MG PO TABS: 1.0000 | ORAL_TABLET | Freq: Every day | ORAL | 0 refills | Status: DC

## 2021-04-21 MED ORDER — DROSPIRENONE-ETHINYL ESTRADIOL 3-0.02 MG PO TABS: 1.0000 | ORAL_TABLET | Freq: Every day | ORAL | 1 refills | Status: DC

## 2021-04-21 MED ORDER — BUPROPION HCL ER (XL) 300 MG PO TB24: 300.0000 mg | ORAL_TABLET | Freq: Every day | ORAL | 1 refills | Status: DC

## 2021-04-21 NOTE — Progress Notes (Signed)
BP 105/73    Pulse 73    Temp 97.8 F (36.6 C) (Oral)    Wt 142 lb 3.2 oz (64.5 kg)    SpO2 99%    BMI 22.27 kg/m    Subjective:    Patient ID: Monica Long, female    DOB: 12-Feb-1971, 51 y.o.   MRN: [id number]  HPI: Monica Long is a 51 y.o. female  No chief complaint on file.  MIGRAINES Patient states she thinks they were better but then recently had two episodes.  She does need updated FMLA paperwork for the Migraines.  After she had the aspiration pneumonia her previous FMLA was no longer valid.  Patient states she has gained weight, losing hair and having worsening fatigue over the last 3-4 months.    Relevant past medical, surgical, family and social history reviewed and updated as indicated. Interim medical history since our last visit reviewed. Allergies and medications reviewed and updated.  Review of Systems  Constitutional:  Positive for fatigue and unexpected weight change.  Endocrine:       Hair loss  Psychiatric/Behavioral:  Positive for dysphoric mood.    Per HPI unless specifically indicated above     Objective:    BP 105/73    Pulse 73    Temp 97.8 F (36.6 C) (Oral)    Wt 142 lb 3.2 oz (64.5 kg)    SpO2 99%    BMI 22.27 kg/m   Wt Readings from Last 3 Encounters:  04/21/21 142 lb 3.2 oz (64.5 kg)  02/27/21 137 lb 12.8 oz (62.5 kg)  02/24/21 136 lb (61.7 kg)    Physical Exam Vitals and nursing note reviewed.  Constitutional:      General: She is not in acute distress.    Appearance: Normal appearance. She is normal weight. She is not ill-appearing, toxic-appearing or diaphoretic.  HENT:     Head: Normocephalic.     Right Ear: External ear normal.     Left Ear: External ear normal.     Nose: Nose normal.     Mouth/Throat:     Mouth: Mucous membranes are moist.     Pharynx: Oropharynx is clear.  Eyes:     General:        Right eye: No discharge.        Left eye: No discharge.     Extraocular Movements: Extraocular movements intact.      Conjunctiva/sclera: Conjunctivae normal.     Pupils: Pupils are equal, round, and reactive to light.  Cardiovascular:     Rate and Rhythm: Normal rate and regular rhythm.     Heart sounds: No murmur heard. Pulmonary:     Effort: Pulmonary effort is normal. No respiratory distress.     Breath sounds: Normal breath sounds. No wheezing or rales.  Musculoskeletal:     Cervical back: Normal range of motion and neck supple.  Skin:    General: Skin is warm and dry.     Capillary Refill: Capillary refill takes less than 2 seconds.  Neurological:     General: No focal deficit present.     Mental Status: She is alert and oriented to person, place, and time. Mental status is at baseline.  Psychiatric:        Mood and Affect: Mood normal.        Behavior: Behavior normal.        Thought Content: Thought content normal.        Judgment: Judgment  normal.    Results for orders placed or performed in visit on 02/27/21  CBC w/Diff  Result Value Ref Range   WBC 8.1 3.4 - 10.8 x10E3/uL   RBC 4.84 3.77 - 5.28 x10E6/uL   Hemoglobin 13.9 11.1 - 15.9 g/dL   Hematocrit 40.6 34.0 - 46.6 %   MCV 84 79 - 97 fL   MCH 28.7 26.6 - 33.0 pg   MCHC 34.2 31.5 - 35.7 g/dL   RDW 13.6 11.7 - 15.4 %   Platelets 275 150 - 450 x10E3/uL   Neutrophils 52 Not Estab. %   Lymphs 36 Not Estab. %   Monocytes 7 Not Estab. %   Eos 4 Not Estab. %   Basos 1 Not Estab. %   Neutrophils Absolute 4.3 1.4 - 7.0 x10E3/uL   Lymphocytes Absolute 2.9 0.7 - 3.1 x10E3/uL   Monocytes Absolute 0.5 0.1 - 0.9 x10E3/uL   EOS (ABSOLUTE) 0.3 0.0 - 0.4 x10E3/uL   Basophils Absolute 0.1 0.0 - 0.2 x10E3/uL   Immature Granulocytes 0 Not Estab. %   Immature Grans (Abs) 0.0 0.0 - 0.1 x10E3/uL  Comp Met (CMET)  Result Value Ref Range   Glucose 96 70 - 99 mg/dL   BUN 16 6 - 24 mg/dL   Creatinine, Ser 0.72 0.57 - 1.00 mg/dL   eGFR 102 >59 mL/min/1.73   BUN/Creatinine Ratio 22 9 - 23   Sodium 142 134 - 144 mmol/L   Potassium 4.1 3.5 - 5.2  mmol/L   Chloride 100 96 - 106 mmol/L   CO2 24 20 - 29 mmol/L   Calcium 9.9 8.7 - 10.2 mg/dL   Total Protein 7.0 6.0 - 8.5 g/dL   Albumin 4.4 3.8 - 4.8 g/dL   Globulin, Total 2.6 1.5 - 4.5 g/dL   Albumin/Globulin Ratio 1.7 1.2 - 2.2   Bilirubin Total <0.2 0.0 - 1.2 mg/dL   Alkaline Phosphatase 66 44 - 121 IU/L   AST 12 0 - 40 IU/L   ALT 9 0 - 32 IU/L      Assessment & Plan:   Problem List Items Addressed This Visit       Other   Depression - Primary    Chronic. Patient is under a lot of stress with work.  Will increase Wellbutrin to 346m daily.  Follow up in 1 month for reevaluation.      Relevant Medications   buPROPion (WELLBUTRIN XL) 300 MG 24 hr tablet   Other Visit Diagnoses     Chronic nonintractable headache, unspecified headache type       Will increase topamax to 568mat bedtime and 2580mn the am. If not improved at 1 month follow up will increase to 55m34m the am also.   Relevant Medications   Baclofen 5 MG TABS   drospirenone-ethinyl estradiol (NIKKI) 3-0.02 MG tablet   buPROPion (WELLBUTRIN XL) 300 MG 24 hr tablet   topiramate (TOPAMAX) 25 MG tablet   Encounter for contraceptive management, unspecified type       Relevant Medications   drospirenone-ethinyl estradiol (NIKKI) 3-0.02 MG tablet   Other fatigue       Will draw labs at visit today. Will make recommendations based on lab results.   Relevant Orders   Vitamin D (25 hydroxy)   Thyroid Panel With TSH        Follow up plan: Return in about 1 month (around 05/19/2021) for Physical and Fasting labs.

## 2021-04-21 NOTE — Assessment & Plan Note (Signed)
Chronic. Patient is under a lot of stress with work.  Will increase Wellbutrin to 300mg  daily.  Follow up in 1 month for reevaluation.

## 2021-04-22 ENCOUNTER — Encounter: Payer: Self-pay | Admitting: Nurse Practitioner

## 2021-04-22 LAB — VITAMIN D 25 HYDROXY (VIT D DEFICIENCY, FRACTURES): Vit D, 25-Hydroxy: 34 ng/mL (ref 30.0–100.0)

## 2021-04-22 LAB — THYROID PANEL WITH TSH
Free Thyroxine Index: 1.5 (ref 1.2–4.9)
T3 Uptake Ratio: 24 % (ref 24–39)
T4, Total: 6.1 ug/dL (ref 4.5–12.0)
TSH: 4.02 u[IU]/mL (ref 0.450–4.500)

## 2021-04-24 ENCOUNTER — Encounter: Payer: Self-pay | Admitting: Nurse Practitioner

## 2021-04-24 NOTE — Progress Notes (Signed)
Hi Aoki. Your lab work looks great.  Vitamin D is within normal range. Your thyroid is also within normal range.  No concerns at this time.  Follow up as discussed.

## 2021-04-30 ENCOUNTER — Ambulatory Visit
Admission: RE | Admit: 2021-04-30 | Discharge: 2021-04-30 | Disposition: A | Discharge status: Home / Self Care | Payer: Managed Care, Other (non HMO) | Source: Ambulatory Visit | Attending: Emergency Medicine | Admitting: Emergency Medicine

## 2021-04-30 ENCOUNTER — Other Ambulatory Visit: Payer: Self-pay

## 2021-04-30 VITALS — BP 140/80 | HR 82 | Temp 98.8°F | Resp 18 | Ht 67.0 in | Wt 145.0 lb

## 2021-04-30 DIAGNOSIS — J069 Acute upper respiratory infection, unspecified: Secondary | ICD-10-CM | POA: Insufficient documentation

## 2021-04-30 DIAGNOSIS — G43909 Migraine, unspecified, not intractable, without status migrainosus: Secondary | ICD-10-CM | POA: Diagnosis not present

## 2021-04-30 DIAGNOSIS — Z8701 Personal history of pneumonia (recurrent): Secondary | ICD-10-CM | POA: Diagnosis not present

## 2021-04-30 DIAGNOSIS — Z20822 Contact with and (suspected) exposure to covid-19: Secondary | ICD-10-CM | POA: Diagnosis not present

## 2021-04-30 DIAGNOSIS — F32A Depression, unspecified: Secondary | ICD-10-CM | POA: Insufficient documentation

## 2021-04-30 DIAGNOSIS — F419 Anxiety disorder, unspecified: Secondary | ICD-10-CM | POA: Diagnosis not present

## 2021-04-30 LAB — SARS CORONAVIRUS 2 (TAT 6-24 HRS): SARS Coronavirus 2: NEGATIVE

## 2021-04-30 MED ORDER — PROMETHAZINE-DM 6.25-15 MG/5ML PO SYRP: 5.0000 mL | ORAL_SOLUTION | Freq: Four times a day (QID) | ORAL | 0 refills | Status: DC | PRN

## 2021-04-30 MED ORDER — ALBUTEROL SULFATE HFA 108 (90 BASE) MCG/ACT IN AERS: 2.0000 | INHALATION_SPRAY | RESPIRATORY_TRACT | 0 refills | Status: DC | PRN

## 2021-04-30 MED ORDER — BENZONATATE 100 MG PO CAPS: 200.0000 mg | ORAL_CAPSULE | Freq: Three times a day (TID) | ORAL | 0 refills | Status: DC

## 2021-04-30 MED ORDER — MOLNUPIRAVIR EUA 200MG CAPSULE: 4.0000 | ORAL_CAPSULE | Freq: Two times a day (BID) | ORAL | 0 refills | Status: AC

## 2021-04-30 MED ORDER — IPRATROPIUM BROMIDE 0.06 % NA SOLN: 2.0000 | Freq: Four times a day (QID) | NASAL | 12 refills | Status: DC

## 2021-04-30 NOTE — ED Triage Notes (Signed)
Pt here with C/O headache, sore throat, chills. Whole family has Covid.

## 2021-04-30 NOTE — Discharge Instructions (Signed)
Isolate at home pending the results of your COVID test.  If you test positive then you will have to quarantine for 5 days from the start of your symptoms.  After 5 days you can break quarantine if your symptoms have improved and you have not had a fever for 24 hours without taking Tylenol or ibuprofen.  Use over-the-counter Tylenol and ibuprofen as needed for body aches and fever.  Use the Atrovent nasal spray, 2 squirts in each nostril every 6 hours, as needed for runny nose and postnasal drip.  Use the Tessalon Perles every 8 hours during the day.  Take them with a small sip of water.  They may give you some numbness to the base of your tongue or a metallic taste in your mouth, this is normal.  Use the Promethazine DM cough syrup at bedtime for cough and congestion.  It will make you drowsy so do not take it during the day.  Given your close proximity to multiple COVID positive family members start the molnupiravir twice daily for 5 days. If your COVID test is negative you can stop.  Use the Albuterol inhaler, 1-2 puffs every 4-6 hours, as needed for shortness of breath or wheezing.  If you develop any increased shortness of breath-especially at rest, you are unable to speak in full sentences, or is a late sign your lips are turning blue you need to go the ER for evaluation.

## 2021-04-30 NOTE — ED Provider Notes (Signed)
[facility]    CSN: [id number] Arrival date & time: 04/30/21  1445      History   Chief Complaint Chief Complaint  Patient presents with   Headache    HPI Monica Long is a 51 y.o. female.   HPI  51 year old female here for evaluation of respiratory complaints.  Patient reports that her symptoms began yesterday and consist of a headache, sore throat, chills, nonproductive cough, shortness of breath, and wheezing.  Her daughter and both parents are all COVID-positive and she has been around them every day.  She denies fever, runny nose nasal congestion, ear pain, or GI complaints.  Patient does have a significant past medical history to include anxiety, depression, and aspiration pneumonia.  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 status migrainosus 12/15/2020   Pain of right thigh 09/15/2020   Varicose veins of leg with pain, bilateral 06/19/2016   Swelling of limb 06/19/2016   Depression 02/08/2014    Past Surgical History:  Procedure Laterality Date   CESAREAN SECTION  05/08/2001   x1   COLONOSCOPY WITH PROPOFOL N/A 02/24/2021   Procedure: COLONOSCOPY WITH PROPOFOL;  Surgeon: Midge Minium, MD;  Location: [facility];  Service: Endoscopy;  Laterality: N/A;    OB History     Gravida  1   Para  1   Term      Preterm  1   AB      Living  1      SAB      IAB       Ectopic      Multiple      Live Births  1            Home Medications    Prior to Admission medications   Medication Sig Start Date End Date Taking? Authorizing Provider  albuterol (VENTOLIN HFA) 108 (90 Base) MCG/ACT inhaler Inhale 2 puffs into the lungs every 4 (four) hours as needed. 04/30/21  Yes Becky Augusta, NP  azelastine (ASTELIN) 0.1 % nasal spray PLACE 1 SPRAY INTO BOTH NOSTRILS 2 (TWO) TIMES DAILY. USE IN EACH NOSTRIL AS DIRECTED 10/23/19  Yes Osvaldo Angst M, PA-C  Baclofen 5 MG TABS Take 1 tablet by mouth in the morning and at bedtime. 04/04/21 05/04/21 Yes [provider]  benzonatate (TESSALON) 100 MG capsule Take 2 capsules (200 mg total) by mouth every 8 (eight) hours. 04/30/21  Yes Becky Augusta, NP  buPROPion (WELLBUTRIN XL) 300 MG 24 hr tablet Take 1 tablet (300 mg total) by mouth daily. 04/21/21  Yes Larae Grooms, NP  butalbital-acetaminophen-caffeine (FIORICET) [phone] MG tablet Take 1 tablet by mouth every 6 (six) hours as needed for headache. 01/16/21  Yes Larae Grooms, NP  drospirenone-ethinyl estradiol (NIKKI) 3-0.02 MG tablet Take 1 tablet by mouth daily. 04/21/21  Yes [place],  Clydie Braun, NP  fluticasone (FLONASE) 50 MCG/ACT nasal spray Place 2 sprays into both nostrils daily. 07/24/19  Yes Osvaldo Angst M, PA-C  ipratropium (ATROVENT) 0.06 % nasal spray Place 2 sprays into both nostrils 4 (four) times daily. 04/30/21  Yes Becky Augusta, NP  lidocaine (LIDODERM) 5 % 1 patch daily. 01/18/21  Yes [provider]  molnupiravir EUA (LAGEVRIO) 200 mg CAPS capsule Take 4 capsules (800 mg total) by mouth 2 (two) times daily for 5 days. 04/30/21 05/05/21 Yes Becky Augusta, NP  Omega-3 1000 MG CAPS Take 1,000 mg by mouth daily.   Yes [provider]  promethazine-dextromethorphan (PROMETHAZINE-DM) 6.25-15 MG/5ML syrup Take 5 mLs by mouth 4 (four) times daily as needed. 04/30/21  Yes Becky Augusta, NP  topiramate (TOPAMAX) 25 MG tablet Take 2  tablets (50 mg total) by mouth 2 (two) times daily. Take 2 tabs at bedtime and 1 in the morning. 04/21/21  Yes Larae Grooms, NP  Azelastine-Fluticasone 137-50 MCG/ACT SUSP Place 1 spray into the nose 2 (two) times daily. 04/22/18 03/09/19  Trey Sailors, PA-C  hydrochlorothiazide (MICROZIDE) 12.5 MG capsule Take 1 capsule (12.5 mg total) by mouth daily. 11/01/17 03/09/19  Trey Sailors, PA-C  sertraline (ZOLOFT) 50 MG tablet TAKE 1 TABLET BY MOUTH EVERY DAY 05/07/19 07/09/19  Trey Sailors, PA-C    Family History Family History  Problem Relation Age of Onset   Healthy Mother    Hypertension Father    Colon cancer Maternal Grandmother 88    Social History Social History   Tobacco Use   Smoking status: Never   Smokeless tobacco: Never  Vaping Use   Vaping Use: Never used  Substance Use Topics   Alcohol use: No    Alcohol/week: 0.0 standard drinks   Drug use: No     Allergies   Amoxicillin   Review of Systems Review of Systems  Constitutional:  Positive for chills. Negative for activity change, appetite change and fever.  HENT:  Positive for sore throat. Negative for congestion, ear pain and rhinorrhea.   Respiratory:  Positive for cough, shortness of breath and wheezing.   Gastrointestinal:  Negative for diarrhea, nausea and vomiting.  Musculoskeletal:  Negative for arthralgias and myalgias.  Skin:  Negative for rash.  Neurological:  Positive for headaches.  Hematological: Negative.   Psychiatric/Behavioral: Negative.      Physical Exam Triage Vital Signs ED Triage Vitals  Enc Vitals Group     BP 04/30/21 1504 140/80     Pulse Rate 04/30/21 1504 82     Resp 04/30/21 1504 18     Temp 04/30/21 1504 98.8 F (37.1 C)     Temp src --      SpO2 04/30/21 1504 97 %     Weight 04/30/21 1504 145 lb (65.8 kg)     Height 04/30/21 1504 5\' 7"  (1.702 m)     Head Circumference --      Peak Flow --      Pain Score 04/30/21 1503 6     Pain Loc --      Pain Edu? --       Excl. in GC? --    No data found.  Updated Vital Signs BP 140/80    Pulse 82    Temp 98.8 F (37.1 C)    Resp 18    Ht 5\' 7"  (1.702 m)    Wt 145 lb (65.8 kg)    LMP 04/02/2021    SpO2 97%  BMI 22.71 kg/m   Visual Acuity Right Eye Distance:   Left Eye Distance:   Bilateral Distance:    Right Eye Near:   Left Eye Near:    Bilateral Near:     Physical Exam Vitals and nursing note reviewed.  Constitutional:      Appearance: Normal appearance. She is not ill-appearing.  HENT:     Head: Normocephalic and atraumatic.     Right Ear: Tympanic membrane, ear canal and external ear normal. There is no impacted cerumen.     Left Ear: Tympanic membrane, ear canal and external ear normal. There is no impacted cerumen.     Nose: Congestion and rhinorrhea present.     Mouth/Throat:     Mouth: Mucous membranes are moist.     Pharynx: Oropharynx is clear. Posterior oropharyngeal erythema present.  Cardiovascular:     Rate and Rhythm: Normal rate and regular rhythm.     Pulses: Normal pulses.     Heart sounds: Normal heart sounds. No murmur heard.   No friction rub. No gallop.  Pulmonary:     Effort: Pulmonary effort is normal.     Breath sounds: Normal breath sounds. No wheezing, rhonchi or rales.  Musculoskeletal:     Cervical back: Normal range of motion and neck supple.  Lymphadenopathy:     Cervical: No cervical adenopathy.  Skin:    General: Skin is warm and dry.     Capillary Refill: Capillary refill takes less than 2 seconds.     Findings: No erythema or rash.  Neurological:     General: No focal deficit present.     Mental Status: She is alert and oriented to person, place, and time.  Psychiatric:        Mood and Affect: Mood normal.        Behavior: Behavior normal.        Thought Content: Thought content normal.        Judgment: Judgment normal.     UC Treatments / Results  Labs (all labs ordered are listed, but only abnormal results are displayed) Labs  Reviewed  SARS CORONAVIRUS 2 (TAT 6-24 HRS)    EKG   Radiology No results found.  Procedures Procedures (including critical care time)  Medications Ordered in UC Medications - No data to display  Initial Impression / Assessment and Plan / UC Course  I have reviewed the triage vital signs and the nursing notes.  Pertinent labs & imaging results that were available during my care of the patient were reviewed by me and considered in my medical decision making (see chart for details).  Patient is a nontoxic-appearing 51 year old female here for evaluation of respiratory complaints as outlined in HPI above.  Patient has had significant COVID exposure between her daughter and both of her parents, whom she has been on every day, being positive for COVID.  She has not had a fever but she does endorse nonproductive cough, shortness breath, and wheezing.  On exam patient has pearly-gray tympanic membranes bilaterally with normal light reflex and clear external auditory canals.  Nasal mucosa is erythematous and edematous with scant clear discharge in both nares.  Oropharyngeal exam reveals mild posterior oropharyngeal erythema with clear postnasal drip.  No cervical of adenopathy appreciable exam.  Cardiopulmonary exam feels clear lung sounds in all fields.  Will swab patient for COVID and discharged home to isolate pending results.  Given her close proximity to multiple COVID-positive patients I will start her presumptively on  molnupiravir.  If your COVID test is negative she can stop.  Good have her hold her azelastine and start the Atrovent nasal spray, 2 squirts up each nostril 4 times a day as needed for nasal congestion.  Tessalon Perles and Promethazine DM cough syrup for cough and congestion.  I have also refilled her albuterol inhaler and she can do 1 to 2 puffs every 4-6 hours as needed for shortness of breath or wheezing.  ER and return precautions reviewed with patient.   Final Clinical  Impressions(s) / UC Diagnoses   Final diagnoses:  Viral URI with cough     Discharge Instructions      Isolate at home pending the results of your COVID test.  If you test positive then you will have to quarantine for 5 days from the start of your symptoms.  After 5 days you can break quarantine if your symptoms have improved and you have not had a fever for 24 hours without taking Tylenol or ibuprofen.  Use over-the-counter Tylenol and ibuprofen as needed for body aches and fever.  Use the Atrovent nasal spray, 2 squirts in each nostril every 6 hours, as needed for runny nose and postnasal drip.  Use the Tessalon Perles every 8 hours during the day.  Take them with a small sip of water.  They may give you some numbness to the base of your tongue or a metallic taste in your mouth, this is normal.  Use the Promethazine DM cough syrup at bedtime for cough and congestion.  It will make you drowsy so do not take it during the day.  Given your close proximity to multiple COVID positive family members start the molnupiravir twice daily for 5 days. If your COVID test is negative you can stop.  Use the Albuterol inhaler, 1-2 puffs every 4-6 hours, as needed for shortness of breath or wheezing.  If you develop any increased shortness of breath-especially at rest, you are unable to speak in full sentences, or is a late sign your lips are turning blue you need to go the ER for evaluation.      ED Prescriptions     Medication Sig Dispense Auth. Provider   molnupiravir EUA (LAGEVRIO) 200 mg CAPS capsule Take 4 capsules (800 mg total) by mouth 2 (two) times daily for 5 days. 40 capsule Becky Augusta, NP   ipratropium (ATROVENT) 0.06 % nasal spray Place 2 sprays into both nostrils 4 (four) times daily. 15 mL Becky Augusta, NP   benzonatate (TESSALON) 100 MG capsule Take 2 capsules (200 mg total) by mouth every 8 (eight) hours. 21 capsule Becky Augusta, NP   promethazine-dextromethorphan  (PROMETHAZINE-DM) 6.25-15 MG/5ML syrup Take 5 mLs by mouth 4 (four) times daily as needed. 118 mL Becky Augusta, NP   albuterol (VENTOLIN HFA) 108 (90 Base) MCG/ACT inhaler Inhale 2 puffs into the lungs every 4 (four) hours as needed. 18 g Becky Augusta, NP      PDMP not reviewed this encounter.   Becky Augusta, NP 04/30/21 [phone]

## 2021-05-01 ENCOUNTER — Telehealth (INDEPENDENT_AMBULATORY_CARE_PROVIDER_SITE_OTHER): Payer: Managed Care, Other (non HMO) | Admitting: Nurse Practitioner

## 2021-05-01 ENCOUNTER — Encounter: Payer: Self-pay | Admitting: Nurse Practitioner

## 2021-05-01 DIAGNOSIS — U071 COVID-19: Secondary | ICD-10-CM

## 2021-05-01 MED ORDER — METHYLPREDNISOLONE 4 MG PO TBPK: ORAL_TABLET | ORAL | 0 refills | Status: DC

## 2021-05-01 NOTE — Progress Notes (Signed)
LMP 04/02/2021    Subjective:    Patient ID: Monica Long, female    DOB: 1970-08-13, 51 y.o.   MRN: [id number]  HPI: Monica Long is a 51 y.o. female  Chief Complaint  Patient presents with   Fever    UPPER RESPIRATORY TRACT INFECTION Worst symptom: Symptoms started on Saturday Fever: yes Cough: yes Shortness of breath:  a little Wheezing: no Chest pain: no Chest tightness: no Chest congestion: yes Nasal congestion: yes Runny nose: no Post nasal drip: no Sneezing: no Sore throat: yes Swollen glands: no Sinus pressure: yes Headache: yes Face pain: no Toothache: no Ear pain: no bilateral Ear pressure: no bilateral Eyes red/itching:no Eye drainage/crusting: no  Vomiting: no Rash: no Fatigue: yes Sick contacts: yes Strep contacts: no  Context: stable Recurrent sinusitis: no Relief with OTC cold/cough medications: no  Treatments attempted:  already on antiviral     Relevant past medical, surgical, family and social history reviewed and updated as indicated. Interim medical history since our last visit reviewed. Allergies and medications reviewed and updated.  Review of Systems  Constitutional:  Positive for fatigue and fever.  HENT:  Positive for congestion, rhinorrhea, sinus pressure, sinus pain and sore throat. Negative for dental problem, ear pain, postnasal drip and sneezing.   Respiratory:  Positive for cough and shortness of breath. Negative for wheezing.   Cardiovascular:  Negative for chest pain.  Gastrointestinal:  Negative for vomiting.  Skin:  Negative for rash.  Neurological:  Positive for headaches.   Per HPI unless specifically indicated above     Objective:    LMP 04/02/2021   Wt Readings from Last 3 Encounters:  04/30/21 145 lb (65.8 kg)  04/21/21 142 lb 3.2 oz (64.5 kg)  02/27/21 137 lb 12.8 oz (62.5 kg)    Physical Exam Vitals and nursing note reviewed.  HENT:     Head: Normocephalic.     Right Ear: Hearing normal.      Left Ear: Hearing normal.     Nose: Nose normal.  Eyes:     Pupils: Pupils are equal, round, and reactive to light.  Pulmonary:     Effort: Pulmonary effort is normal. No respiratory distress.  Neurological:     Mental Status: She is alert.  Psychiatric:        Mood and Affect: Mood normal.        Behavior: Behavior normal.        Thought Content: Thought content normal.        Judgment: Judgment normal.    Results for orders placed or performed during the hospital encounter of 04/30/21  SARS CORONAVIRUS 2 (TAT 6-24 HRS) Nasopharyngeal Nasopharyngeal Swab   Specimen: Nasopharyngeal Swab  Result Value Ref Range   SARS Coronavirus 2 NEGATIVE NEGATIVE      Assessment & Plan:   Problem List Items Addressed This Visit   None Visit Diagnoses     COVID-19    -  Primary   Already on antiviral. Complete course. Will send medrol dos pak.  Complete course. Discussed quarantine and s/s to monitor for and when to FU.        Follow up plan: No follow-ups on file.   This visit was completed via MyChart due to the restrictions of the COVID-19 pandemic. All issues as above were discussed and addressed. Physical exam was done as above through visual confirmation on MyChart. If it was felt that the patient should be evaluated in the office, they  were directed there. The patient verbally consented to this visit. Location of the patient: Home Location of the provider: Office Those involved with this call:  Provider: Jon Billings, NP CMA: Yvonna Alanis, George Desk/Registration: Myrlene Broker This encounter was conducted via video.  I spent 20 dedicated to the care of this patient on the date of this encounter to include previsit review of COVID treatment and what to monitor for, face to face time with the patient, and post visit ordering of testing.

## 2021-05-03 ENCOUNTER — Encounter: Payer: Self-pay | Admitting: Nurse Practitioner

## 2021-05-08 ENCOUNTER — Encounter: Payer: Self-pay | Admitting: Nurse Practitioner

## 2021-05-08 DIAGNOSIS — J00 Acute nasopharyngitis [common cold]: Secondary | ICD-10-CM

## 2021-05-11 MED ORDER — FLUTICASONE PROPIONATE 50 MCG/ACT NA SUSP: 2.0000 | Freq: Every day | NASAL | 1 refills | Status: DC

## 2021-05-11 MED ORDER — METHYLPREDNISOLONE 4 MG PO TBPK: ORAL_TABLET | ORAL | 0 refills | Status: DC

## 2021-05-20 ENCOUNTER — Other Ambulatory Visit: Payer: Self-pay | Admitting: Nurse Practitioner

## 2021-05-20 DIAGNOSIS — G8929 Other chronic pain: Secondary | ICD-10-CM

## 2021-05-20 DIAGNOSIS — R519 Headache, unspecified: Secondary | ICD-10-CM

## 2021-05-20 DIAGNOSIS — Z309 Encounter for contraceptive management, unspecified: Secondary | ICD-10-CM

## 2021-05-22 ENCOUNTER — Encounter: Payer: Self-pay | Admitting: Nurse Practitioner

## 2021-05-22 NOTE — Telephone Encounter (Signed)
Requested Prescriptions  ?Pending Prescriptions Disp Refills  ?? NIKKI 3-0.02 MG tablet [Pharmacy Med Name: NIKKI 3 MG-0.02 MG TABLET] 28 tablet 2  ?  Sig: TAKE 1 TABLET BY MOUTH EVERY DAY  ?  ? OB/GYN:  Contraceptives Failed - 05/20/2021  2:35 PM  ?  ?  Failed - Last BP in normal range  ?  BP Readings from Last 1 Encounters:  ?04/30/21 140/80  ?   ?  ?  Passed - Valid encounter within last 12 months  ?  Recent Outpatient Visits   ?      ? 3 weeks ago COVID-19  ? [facility] Larae Grooms, NP  ? 1 month ago Depression, unspecified depression type  ? [facility] Larae Grooms, NP  ? 2 months ago Aspiration pneumonia of left lung due to vomit, unspecified part of lung (HCC)  ? [facility] Larae Grooms, NP  ? 4 months ago Screening for colon cancer  ? [facility] Larae Grooms, NP  ? 5 months ago Intractable chronic migraine without aura and without status migrainosus  ? [facility], Jake Church, NP  ?  ?  ?Future Appointments   ?        ? In 2 months Larae Grooms, NP [facility]  ?  ? ?  ?  ?  Passed - Patient is not a smoker  ?  ?  ? ? ?

## 2021-05-24 ENCOUNTER — Encounter: Payer: Self-pay | Admitting: Nurse Practitioner

## 2021-05-24 ENCOUNTER — Other Ambulatory Visit: Payer: Self-pay | Admitting: Nurse Practitioner

## 2021-05-24 DIAGNOSIS — J00 Acute nasopharyngitis [common cold]: Secondary | ICD-10-CM

## 2021-05-24 NOTE — Telephone Encounter (Signed)
Monica Long, you only have overbooks and virtual appointment. Please advise ?

## 2021-05-25 NOTE — Telephone Encounter (Signed)
Unable to leave vm to schedule appt for pt ?

## 2021-05-25 NOTE — Telephone Encounter (Signed)
Requested medication (s) are due for refill today: / ? ?Requested medication (s) are on the active medication list: yes   ? ?Last refill: 05/11/21 11.1 ml  1 refill ? ?Future visit scheduled yes 08/10/21 ? ?Notes to clinic:Not delegated, please review. Thank you. ? ?Requested Prescriptions  ?Pending Prescriptions Disp Refills  ? fluticasone (FLONASE) 50 MCG/ACT nasal spray [Pharmacy Med Name: FLUTICASONE PROP 50 MCG SPRAY] 48 mL 1  ?  Sig: SPRAY 2 SPRAYS INTO EACH NOSTRIL EVERY DAY  ?  ? Not Delegated - Ear, Nose, and Throat: Nasal Preparations - Corticosteroids Failed - 05/24/2021  5:41 PM  ?  ?  Failed - This refill cannot be delegated  ?  ?  Passed - Valid encounter within last 12 months  ?  Recent Outpatient Visits   ? ?      ? 3 weeks ago COVID-19  ? [facility] Larae Grooms, NP  ? 1 month ago Depression, unspecified depression type  ? [facility] Larae Grooms, NP  ? 2 months ago Aspiration pneumonia of left lung due to vomit, unspecified part of lung (HCC)  ? [facility] Larae Grooms, NP  ? 4 months ago Screening for colon cancer  ? [facility] Larae Grooms, NP  ? 5 months ago Intractable chronic migraine without aura and without status migrainosus  ? [facility], Lauren A, NP  ? ?  ?  ?Future Appointments   ? ?        ? In 2 months Larae Grooms, NP [facility]  ? ?  ? ?  ?  ?  ? ? ? ? ?

## 2021-05-26 ENCOUNTER — Encounter: Payer: Managed Care, Other (non HMO) | Admitting: Nurse Practitioner

## 2021-06-05 ENCOUNTER — Encounter: Payer: Self-pay | Admitting: Nurse Practitioner

## 2021-06-06 ENCOUNTER — Encounter: Payer: Self-pay | Admitting: Nurse Practitioner

## 2021-06-06 NOTE — Telephone Encounter (Signed)
You dont have avaiability, Please advise ?

## 2021-06-07 ENCOUNTER — Other Ambulatory Visit: Payer: Self-pay | Admitting: Nurse Practitioner

## 2021-06-08 ENCOUNTER — Other Ambulatory Visit: Payer: Self-pay

## 2021-06-08 ENCOUNTER — Encounter: Payer: Self-pay | Admitting: Nurse Practitioner

## 2021-06-08 ENCOUNTER — Ambulatory Visit: Payer: Managed Care, Other (non HMO) | Admitting: Nurse Practitioner

## 2021-06-08 VITALS — BP 117/67 | HR 76 | Temp 98.4°F | Wt 146.0 lb

## 2021-06-08 DIAGNOSIS — N921 Excessive and frequent menstruation with irregular cycle: Secondary | ICD-10-CM | POA: Diagnosis not present

## 2021-06-08 LAB — URINALYSIS, ROUTINE W REFLEX MICROSCOPIC
Bilirubin, UA: NEGATIVE
Glucose, UA: NEGATIVE
Ketones, UA: NEGATIVE
Leukocytes,UA: NEGATIVE
Nitrite, UA: NEGATIVE
Protein,UA: NEGATIVE
RBC, UA: NEGATIVE
Specific Gravity, UA: 1.02 (ref 1.005–1.030)
Urobilinogen, Ur: 0.2 mg/dL (ref 0.2–1.0)
pH, UA: 6 (ref 5.0–7.5)

## 2021-06-08 NOTE — Progress Notes (Signed)
? ?BP 117/67   Pulse 76   Temp 98.4 ?F (36.9 ?C) (Oral)   Wt 146 lb (66.2 kg)   LMP 06/01/2021 (Exact Date)   SpO2 98%   BMI 22.87 kg/m?   ? ?Subjective:  ? ? Patient ID: Monica Long, female    DOB: 1970-05-08, 51 y.o.   MRN: [id number] ? ?HPI: ?Monica Long is a 51 y.o. female ? ?Chief Complaint  ?Patient presents with  ? Vaginal Bleeding  ?  Pt states she has been having abnormal vaginal bleeding for the past month and a half. States that she will bleed for 5 days and then stop for 3, then start again. States she has also had increased headaches with this bleeding as well.   ? ?VAGINAL DISCHARGE ?Pt states she has been having abnormal vaginal bleeding for the past month and a half. States that she will bleed for 5 days and then stop for 3, then start again. States she has also had increased headaches with this bleeding as well.  Patient states it is very heavy bleeding.  She is still on the birth control. Having a lot of headaches, cramping and back pain. ? ? ? ?Relevant past medical, surgical, family and social history reviewed and updated as indicated. Interim medical history since our last visit reviewed. ?Allergies and medications reviewed and updated. ? ?Review of Systems  ?Genitourinary:  Positive for menstrual problem and pelvic pain.  ?     Heavy vaginal bleeding  ?Musculoskeletal:  Positive for back pain.  ?Neurological:  Positive for headaches.  ? ?Per HPI unless specifically indicated above ? ?   ?Objective:  ?  ?BP 117/67   Pulse 76   Temp 98.4 ?F (36.9 ?C) (Oral)   Wt 146 lb (66.2 kg)   LMP 06/01/2021 (Exact Date)   SpO2 98%   BMI 22.87 kg/m?   ?Wt Readings from Last 3 Encounters:  ?06/08/21 146 lb (66.2 kg)  ?04/30/21 145 lb (65.8 kg)  ?04/21/21 142 lb 3.2 oz (64.5 kg)  ?  ?Physical Exam ?Vitals and nursing note reviewed.  ?Constitutional:   ?   General: She is not in acute distress. ?   Appearance: Normal appearance. She is normal weight. She is not ill-appearing, toxic-appearing  or diaphoretic.  ?HENT:  ?   Head: Normocephalic.  ?   Right Ear: External ear normal.  ?   Left Ear: External ear normal.  ?   Nose: Nose normal.  ?   Mouth/Throat:  ?   Mouth: Mucous membranes are moist.  ?   Pharynx: Oropharynx is clear.  ?Eyes:  ?   General:     ?   Right eye: No discharge.     ?   Left eye: No discharge.  ?   Extraocular Movements: Extraocular movements intact.  ?   Conjunctiva/sclera: Conjunctivae normal.  ?   Pupils: Pupils are equal, round, and reactive to light.  ?Cardiovascular:  ?   Rate and Rhythm: Normal rate and regular rhythm.  ?   Heart sounds: No murmur heard. ?Pulmonary:  ?   Effort: Pulmonary effort is normal. No respiratory distress.  ?   Breath sounds: Normal breath sounds. No wheezing or rales.  ?Musculoskeletal:  ?   Cervical back: Normal range of motion and neck supple.  ?Skin: ?   General: Skin is warm and dry.  ?   Capillary Refill: Capillary refill takes less than 2 seconds.  ?Neurological:  ?   General: No  focal deficit present.  ?   Mental Status: She is alert and oriented to person, place, and time. Mental status is at baseline.  ?Psychiatric:     ?   Mood and Affect: Mood normal.     ?   Behavior: Behavior normal.     ?   Thought Content: Thought content normal.     ?   Judgment: Judgment normal.  ? ? ?Results for orders placed or performed during the hospital encounter of 04/30/21  ?SARS CORONAVIRUS 2 (TAT 6-24 HRS) Nasopharyngeal Nasopharyngeal Swab  ? Specimen: Nasopharyngeal Swab  ?Result Value Ref Range  ? SARS Coronavirus 2 NEGATIVE NEGATIVE  ? ?   ?Assessment & Plan:  ? ?Problem List Items Addressed This Visit   ?None ?Visit Diagnoses   ? ? Menometrorrhagia    -  Primary  ? Labs and [place] ordered to evaluate for cause of ongoing bleeding. Will make recommendations based on results.    ? Relevant Orders  ? Comp Met (CMET)  ? CBC w/Diff  ? US Pelvic Complete With Transvaginal  ? Urinalysis, Routine w reflex microscopic  ? ?  ?  ? ?Follow up plan: ?No follow-ups on  file. ? ? ? ? ? ?

## 2021-06-08 NOTE — Telephone Encounter (Signed)
Requested medication (s) are due for refill today:   No ? ?Requested medication (s) are on the active medication list:   Yes ? ?Future visit scheduled:   Yes ? ? ?Last ordered: 04/21/2021 #90, 1 refill ? ?Returned because it's a non delegated refill  ? ?Requested Prescriptions  ?Pending Prescriptions Disp Refills  ? topiramate (TOPAMAX) 25 MG tablet [Pharmacy Med Name: Topiramate 25 MG Oral Tablet] 180 tablet 5  ?  Sig: TAKE 1 TABLET BY MOUTH IN THE  MORNING AND 2 TABLETS BY MOUTH  AT BEDTIME  ?  ? Neurology: Anticonvulsants - topiramate & zonisamide Passed - 06/07/2021  5:52 AM  ?  ?  Passed - Cr in normal range and within 360 days  ?  Creatinine, Ser  ?Date Value Ref Range Status  ?02/27/2021 0.72 0.57 - 1.00 mg/dL Final  ?  ?  ?  ?  Passed - CO2 in normal range and within 360 days  ?  CO2  ?Date Value Ref Range Status  ?02/27/2021 24 20 - 29 mmol/L Final  ?  ?  ?  ?  Passed - ALT in normal range and within 360 days  ?  ALT  ?Date Value Ref Range Status  ?02/27/2021 9 0 - 32 IU/L Final  ?  ?  ?  ?  Passed - AST in normal range and within 360 days  ?  AST  ?Date Value Ref Range Status  ?02/27/2021 12 0 - 40 IU/L Final  ?  ?  ?  ?  Passed - Completed PHQ-2 or PHQ-9 in the last 360 days  ?  ?  Passed - Valid encounter within last 12 months  ?  Recent Outpatient Visits   ? ?      ? Today Menometrorrhagia  ? [facility] Larae Grooms, NP  ? 1 month ago COVID-19  ? [facility] Larae Grooms, NP  ? 1 month ago Depression, unspecified depression type  ? [facility] Larae Grooms, NP  ? 3 months ago Aspiration pneumonia of left lung due to vomit, unspecified part of lung (HCC)  ? [facility] Larae Grooms, NP  ? 4 months ago Screening for colon cancer  ? [facility] Larae Grooms, NP  ? ?  ?  ?Future Appointments   ? ?        ? In 2 months Larae Grooms, NP [facility]  ? ?  ? ?  ?  ?  ? ?

## 2021-06-09 ENCOUNTER — Ambulatory Visit: Payer: Managed Care, Other (non HMO) | Admitting: Nurse Practitioner

## 2021-06-09 ENCOUNTER — Encounter: Payer: Self-pay | Admitting: Nurse Practitioner

## 2021-06-09 LAB — CBC WITH DIFFERENTIAL/PLATELET
Basophils Absolute: 0.1 10*3/uL (ref 0.0–0.2)
Basos: 1 %
EOS (ABSOLUTE): 0.1 10*3/uL (ref 0.0–0.4)
Eos: 1 %
Hematocrit: 38.6 % (ref 34.0–46.6)
Hemoglobin: 13.2 g/dL (ref 11.1–15.9)
Immature Grans (Abs): 0 10*3/uL (ref 0.0–0.1)
Immature Granulocytes: 0 %
Lymphocytes Absolute: 3.2 10*3/uL — ABNORMAL HIGH (ref 0.7–3.1)
Lymphs: 29 %
MCH: 28.8 pg (ref 26.6–33.0)
MCHC: 34.2 g/dL (ref 31.5–35.7)
MCV: 84 fL (ref 79–97)
Monocytes Absolute: 0.7 10*3/uL (ref 0.1–0.9)
Monocytes: 6 %
Neutrophils Absolute: 6.7 10*3/uL (ref 1.4–7.0)
Neutrophils: 63 %
Platelets: 312 10*3/uL (ref 150–450)
RBC: 4.59 x10E6/uL (ref 3.77–5.28)
RDW: 14.5 % (ref 11.7–15.4)
WBC: 10.8 10*3/uL (ref 3.4–10.8)

## 2021-06-09 LAB — COMPREHENSIVE METABOLIC PANEL
ALT: 11 IU/L (ref 0–32)
AST: 12 IU/L (ref 0–40)
Albumin/Globulin Ratio: 1.7 (ref 1.2–2.2)
Albumin: 4.5 g/dL (ref 3.8–4.8)
Alkaline Phosphatase: 72 IU/L (ref 44–121)
BUN/Creatinine Ratio: 24 — ABNORMAL HIGH (ref 9–23)
BUN: 20 mg/dL (ref 6–24)
Bilirubin Total: 0.2 mg/dL (ref 0.0–1.2)
CO2: 26 mmol/L (ref 20–29)
Calcium: 10.4 mg/dL — ABNORMAL HIGH (ref 8.7–10.2)
Chloride: 98 mmol/L (ref 96–106)
Creatinine, Ser: 0.85 mg/dL (ref 0.57–1.00)
Globulin, Total: 2.6 g/dL (ref 1.5–4.5)
Glucose: 87 mg/dL (ref 70–99)
Potassium: 4.5 mmol/L (ref 3.5–5.2)
Sodium: 139 mmol/L (ref 134–144)
Total Protein: 7.1 g/dL (ref 6.0–8.5)
eGFR: 83 mL/min/{1.73_m2} (ref >59)

## 2021-06-09 NOTE — Progress Notes (Signed)
Hi Mable.  Your lab work looks great.  No concerns at this time.  Let's get that ultrasound and see if we can figure out what is going on.

## 2021-06-12 ENCOUNTER — Encounter: Payer: Self-pay | Admitting: Nurse Practitioner

## 2021-06-13 ENCOUNTER — Encounter: Payer: Self-pay | Admitting: Nurse Practitioner

## 2021-06-15 ENCOUNTER — Encounter: Payer: Self-pay | Admitting: Nurse Practitioner

## 2021-06-15 ENCOUNTER — Ambulatory Visit: Payer: Managed Care, Other (non HMO) | Admitting: Nurse Practitioner

## 2021-06-19 ENCOUNTER — Ambulatory Visit: Payer: Managed Care, Other (non HMO)

## 2021-06-28 ENCOUNTER — Other Ambulatory Visit: Payer: Managed Care, Other (non HMO)

## 2021-06-29 ENCOUNTER — Ambulatory Visit: Payer: Managed Care, Other (non HMO)

## 2021-07-23 ENCOUNTER — Encounter: Payer: Self-pay | Admitting: Nurse Practitioner

## 2021-08-09 NOTE — Progress Notes (Unsigned)
There were no vitals taken for this visit.   Subjective:    Patient ID: Monica Long, female    DOB: 02-Dec-1970, 50 y.o.   MRN: [id number]  HPI: Monica Long is a 51 y.o. female presenting on 08/10/2021 for comprehensive medical examination. Current medical complaints include:{Blank single:19197::"none","***"}  She currently lives with: Menopausal Symptoms: {Blank single:19197::"yes","no"}  MOOD  Depression Screen done today and results listed below:     04/21/2021    4:10 PM 01/16/2021    4:05 PM 01/16/2021    4:04 PM 09/15/2020    4:11 PM 05/17/2020    4:04 PM  Depression screen PHQ 2/9  Decreased Interest 0 0 0 0 0  Down, Depressed, Hopeless 0 1 0 0 0  PHQ - 2 Score 0 1 0 0 0  Altered sleeping 0 0   0  Tired, decreased energy 3 0   0  Change in appetite 0 0   0  Feeling bad or failure about yourself  0 0   0  Trouble concentrating 0 0   0  Moving slowly or fidgety/restless 0 0   0  Suicidal thoughts 0 0   0  PHQ-9 Score 3 1   0  Difficult doing work/chores Somewhat difficult    Not difficult at all    The patient {has/does not have:19849} a history of falls. I {did/did not:19850} complete a risk assessment for falls. A plan of care for falls {was/was not:19852} documented.   Past Medical History:  Past Medical History:  Diagnosis Date   Anxiety    panic attack   Depression 02/08/2014   Hepatic steatosis    History of mammogram 01/07/2014; 05-09-15   birad 2; neg   History of Papanicolaou smear of cervix 01/07/2014   -/-   Hypercholesteremia    Hypertriglyceridemia    Kidney stone 02/2015   Peripheral vascular disease [facility])     Surgical History:  Past Surgical History:  Procedure Laterality Date   CESAREAN SECTION  05/08/2001   x1   COLONOSCOPY WITH PROPOFOL N/A 02/24/2021   Procedure: COLONOSCOPY WITH PROPOFOL;  Surgeon: Lucilla Lame, MD;  Location: [facility];  Service: Endoscopy;  Laterality: N/A;    Medications:  Current Outpatient  Medications on File Prior to Visit  Medication Sig   topiramate (TOPAMAX) 25 MG tablet Take 1 tablet (25 mg total) by mouth 3 (three) times daily.   albuterol (VENTOLIN HFA) 108 (90 Base) MCG/ACT inhaler Inhale 2 puffs into the lungs every 4 (four) hours as needed.   azelastine (ASTELIN) 0.1 % nasal spray PLACE 1 SPRAY INTO BOTH NOSTRILS 2 (TWO) TIMES DAILY. USE IN EACH NOSTRIL AS DIRECTED   buPROPion (WELLBUTRIN XL) 300 MG 24 hr tablet Take 1 tablet (300 mg total) by mouth daily.   butalbital-acetaminophen-caffeine (FIORICET) 50-325-40 MG tablet Take 1 tablet by mouth every 6 (six) hours as needed for headache.   fluticasone (FLONASE) 50 MCG/ACT nasal spray SPRAY 2 SPRAYS INTO EACH NOSTRIL EVERY DAY   NIKKI 3-0.02 MG tablet TAKE 1 TABLET BY MOUTH EVERY DAY   Omega-3 1000 MG CAPS Take 1,000 mg by mouth daily.   [DISCONTINUED] Azelastine-Fluticasone 137-50 MCG/ACT SUSP Place 1 spray into the nose 2 (two) times daily.   [DISCONTINUED] hydrochlorothiazide (MICROZIDE) 12.5 MG capsule Take 1 capsule (12.5 mg total) by mouth daily.   [DISCONTINUED] sertraline (ZOLOFT) 50 MG tablet TAKE 1 TABLET BY MOUTH EVERY DAY   No current facility-administered medications on file prior  to visit.    Allergies:  Allergies  Allergen Reactions   Amoxicillin     Severe vomiting     Social History:  Social History   Socioeconomic History   Marital status: Single    Spouse name: Not on file   Number of children: 1   Years of education: 12   Highest education level: Not on file  Occupational History    Employer: [employer]  Tobacco Use   Smoking status: Never   Smokeless tobacco: Never  Vaping Use   Vaping Use: Never used  Substance and Sexual Activity   Alcohol use: No    Alcohol/week: 0.0 standard drinks   Drug use: No   Sexual activity: Yes    Birth control/protection: Pill  Other Topics Concern   Not on file  Social History Narrative   Not on file   Social Determinants of Health    Financial Resource Strain: Not on file  Food Insecurity: Not on file  Transportation Needs: Not on file  Physical Activity: Not on file  Stress: Not on file  Social Connections: Not on file  Intimate Partner Violence: Not on file   Social History   Tobacco Use  Smoking Status Never  Smokeless Tobacco Never   Social History   Substance and Sexual Activity  Alcohol Use No   Alcohol/week: 0.0 standard drinks    Family History:  Family History  Problem Relation Age of Onset   Healthy Mother    Hypertension Father    Colon cancer Maternal Grandmother 54    Past medical history, surgical history, medications, allergies, family history and social history reviewed with patient today and changes made to appropriate areas of the chart.   ROS All other ROS negative except what is listed above and in the HPI.      Objective:    There were no vitals taken for this visit.  Wt Readings from Last 3 Encounters:  06/08/21 146 lb (66.2 kg)  04/30/21 145 lb (65.8 kg)  04/21/21 142 lb 3.2 oz (64.5 kg)    Physical Exam  Results for orders placed or performed in visit on 06/08/21  Comp Met (CMET)  Result Value Ref Range   Glucose 87 70 - 99 mg/dL   BUN 20 6 - 24 mg/dL   Creatinine, Ser 0.85 0.57 - 1.00 mg/dL   eGFR 83 >59 mL/min/1.73   BUN/Creatinine Ratio 24 (H) 9 - 23   Sodium 139 134 - 144 mmol/L   Potassium 4.5 3.5 - 5.2 mmol/L   Chloride 98 96 - 106 mmol/L   CO2 26 20 - 29 mmol/L   Calcium 10.4 (H) 8.7 - 10.2 mg/dL   Total Protein 7.1 6.0 - 8.5 g/dL   Albumin 4.5 3.8 - 4.8 g/dL   Globulin, Total 2.6 1.5 - 4.5 g/dL   Albumin/Globulin Ratio 1.7 1.2 - 2.2   Bilirubin Total 0.2 0.0 - 1.2 mg/dL   Alkaline Phosphatase 72 44 - 121 IU/L   AST 12 0 - 40 IU/L   ALT 11 0 - 32 IU/L  CBC w/Diff  Result Value Ref Range   WBC 10.8 3.4 - 10.8 x10E3/uL   RBC 4.59 3.77 - 5.28 x10E6/uL   Hemoglobin 13.2 11.1 - 15.9 g/dL   Hematocrit 38.6 34.0 - 46.6 %   MCV 84 79 - 97 fL   MCH  28.8 26.6 - 33.0 pg   MCHC 34.2 31.5 - 35.7 g/dL   RDW 14.5 11.7 - 15.4 %     Platelets 312 150 - 450 x10E3/uL   Neutrophils 63 Not Estab. %   Lymphs 29 Not Estab. %   Monocytes 6 Not Estab. %   Eos 1 Not Estab. %   Basos 1 Not Estab. %   Neutrophils Absolute 6.7 1.4 - 7.0 x10E3/uL   Lymphocytes Absolute 3.2 (H) 0.7 - 3.1 x10E3/uL   Monocytes Absolute 0.7 0.1 - 0.9 x10E3/uL   EOS (ABSOLUTE) 0.1 0.0 - 0.4 x10E3/uL   Basophils Absolute 0.1 0.0 - 0.2 x10E3/uL   Immature Granulocytes 0 Not Estab. %   Immature Grans (Abs) 0.0 0.0 - 0.1 x10E3/uL  Urinalysis, Routine w reflex microscopic  Result Value Ref Range   Specific Gravity, UA 1.020 1.005 - 1.030   pH, UA 6.0 5.0 - 7.5   Color, UA Yellow Yellow   Appearance Ur Clear Clear   Leukocytes,UA Negative Negative   Protein,UA Negative Negative/Trace   Glucose, UA Negative Negative   Ketones, UA Negative Negative   RBC, UA Negative Negative   Bilirubin, UA Negative Negative   Urobilinogen, Ur 0.2 0.2 - 1.0 mg/dL   Nitrite, UA Negative Negative      Assessment & Plan:   Problem List Items Addressed This Visit       Other   Anxiety   Depression   Other Visit Diagnoses     Annual physical exam    -  Primary        Follow up plan: No follow-ups on file.   LABORATORY TESTING:  - Pap smear: {Blank single:19197::"pap done","not applicable","up to date","done elsewhere"}  IMMUNIZATIONS:   - Tdap: Tetanus vaccination status reviewed: {tetanus status:315746}. - Influenza: {Blank single:19197::"Up to date","Administered today","Postponed to flu season","Refused","Given elsewhere"} - Pneumovax: {Blank single:19197::"Up to date","Administered today","Not applicable","Refused","Given elsewhere"} - Prevnar: {Blank single:19197::"Up to date","Administered today","Not applicable","Refused","Given elsewhere"} - COVID: {Blank single:19197::"Up to date","Administered today","Not applicable","Refused","Given elsewhere"} - HPV: {Blank  single:19197::"Up to date","Administered today","Not applicable","Refused","Given elsewhere"} - Shingrix vaccine: {Blank single:19197::"Up to date","Administered today","Not applicable","Refused","Given elsewhere"}  SCREENING: -Mammogram: {Blank single:19197::"Up to date","Ordered today","Not applicable","Refused","Done elsewhere"}  - Colonoscopy: {Blank single:19197::"Up to date","Ordered today","Not applicable","Refused","Done elsewhere"}  - Bone Density: {Blank single:19197::"Up to date","Ordered today","Not applicable","Refused","Done elsewhere"}  -Hearing Test: {Blank single:19197::"Up to date","Ordered today","Not applicable","Refused","Done elsewhere"}  -Spirometry: {Blank single:19197::"Up to date","Ordered today","Not applicable","Refused","Done elsewhere"}   PATIENT COUNSELING:   Advised to take 1 mg of folate supplement per day if capable of pregnancy.   Sexuality: Discussed sexually transmitted diseases, partner selection, use of condoms, avoidance of unintended pregnancy  and contraceptive alternatives.   Advised to avoid cigarette smoking.  I discussed with the patient that most people either abstain from alcohol or drink within safe limits (<=14/week and <=4 drinks/occasion for males, <=7/weeks and <= 3 drinks/occasion for females) and that the risk for alcohol disorders and other health effects rises proportionally with the number of drinks per week and how often a drinker exceeds daily limits.  Discussed cessation/primary prevention of drug use and availability of treatment for abuse.   Diet: Encouraged to adjust caloric intake to maintain  or achieve ideal body weight, to reduce intake of dietary saturated fat and total fat, to limit sodium intake by avoiding high sodium foods and not adding table salt, and to maintain adequate dietary potassium and calcium preferably from fresh fruits, vegetables, and low-fat dairy products.    stressed the importance of regular  exercise  Injury prevention: Discussed safety belts, safety helmets, smoke detector, smoking near bedding or upholstery.   Dental health: Discussed importance of regular tooth   brushing, flossing, and dental visits.    NEXT PREVENTATIVE PHYSICAL DUE IN 1 YEAR. No follow-ups on file.

## 2021-08-10 ENCOUNTER — Other Ambulatory Visit (HOSPITAL_COMMUNITY)
Admission: RE | Admit: 2021-08-10 | Discharge: 2021-08-10 | Disposition: A | Discharge status: Home / Self Care | Payer: Managed Care, Other (non HMO) | Source: Ambulatory Visit | Attending: Nurse Practitioner | Admitting: Nurse Practitioner

## 2021-08-10 ENCOUNTER — Encounter: Payer: Self-pay | Admitting: Nurse Practitioner

## 2021-08-10 ENCOUNTER — Ambulatory Visit (INDEPENDENT_AMBULATORY_CARE_PROVIDER_SITE_OTHER): Payer: Managed Care, Other (non HMO) | Admitting: Nurse Practitioner

## 2021-08-10 VITALS — BP 107/68 | HR 68 | Temp 98.5°F | Ht 67.32 in | Wt 147.2 lb

## 2021-08-10 DIAGNOSIS — F419 Anxiety disorder, unspecified: Secondary | ICD-10-CM | POA: Diagnosis not present

## 2021-08-10 DIAGNOSIS — J00 Acute nasopharyngitis [common cold]: Secondary | ICD-10-CM

## 2021-08-10 DIAGNOSIS — Z0000 Encounter for general adult medical examination without abnormal findings: Secondary | ICD-10-CM | POA: Diagnosis present

## 2021-08-10 DIAGNOSIS — Z1159 Encounter for screening for other viral diseases: Secondary | ICD-10-CM

## 2021-08-10 DIAGNOSIS — F32A Depression, unspecified: Secondary | ICD-10-CM | POA: Diagnosis not present

## 2021-08-10 LAB — URINALYSIS, ROUTINE W REFLEX MICROSCOPIC
Bilirubin, UA: NEGATIVE
Glucose, UA: NEGATIVE
Ketones, UA: NEGATIVE
Leukocytes,UA: NEGATIVE
Nitrite, UA: NEGATIVE
Protein,UA: NEGATIVE
RBC, UA: NEGATIVE
Specific Gravity, UA: 1.025 (ref 1.005–1.030)
Urobilinogen, Ur: 0.2 mg/dL (ref 0.2–1.0)
pH, UA: 6.5 (ref 5.0–7.5)

## 2021-08-10 MED ORDER — FLUTICASONE PROPIONATE 50 MCG/ACT NA SUSP: NASAL | 1 refills | Status: DC

## 2021-08-10 MED ORDER — TOPIRAMATE 25 MG PO TABS: 25.0000 mg | ORAL_TABLET | Freq: Three times a day (TID) | ORAL | 1 refills | Status: DC

## 2021-08-10 MED ORDER — BUTALBITAL-APAP-CAFFEINE 50-325-40 MG PO TABS
1.0000 | ORAL_TABLET | Freq: Four times a day (QID) | ORAL | 0 refills | Status: DC | PRN
Start: 2021-08-10 — End: 2021-10-17

## 2021-08-10 MED ORDER — BUPROPION HCL ER (XL) 300 MG PO TB24: 300.0000 mg | ORAL_TABLET | Freq: Every day | ORAL | 1 refills | Status: DC

## 2021-08-10 NOTE — Assessment & Plan Note (Signed)
Chronic.  Controlled.  Continue with current medication regimen of Wellbutrin 300mg daily.  Refill sent today.  Labs ordered today.  Return to clinic in 6 months for reevaluation.  Call sooner if concerns arise.  ? ?

## 2021-08-11 ENCOUNTER — Encounter: Payer: Self-pay | Admitting: Nurse Practitioner

## 2021-08-11 LAB — CBC WITH DIFFERENTIAL/PLATELET
Basophils Absolute: 0.1 10*3/uL (ref 0.0–0.2)
Basos: 1 %
EOS (ABSOLUTE): 0.1 10*3/uL (ref 0.0–0.4)
Eos: 2 %
Hematocrit: 38.2 % (ref 34.0–46.6)
Hemoglobin: 13.2 g/dL (ref 11.1–15.9)
Immature Grans (Abs): 0 10*3/uL (ref 0.0–0.1)
Immature Granulocytes: 0 %
Lymphocytes Absolute: 2.4 10*3/uL (ref 0.7–3.1)
Lymphs: 31 %
MCH: 29.5 pg (ref 26.6–33.0)
MCHC: 34.6 g/dL (ref 31.5–35.7)
MCV: 85 fL (ref 79–97)
Monocytes Absolute: 0.5 10*3/uL (ref 0.1–0.9)
Monocytes: 6 %
Neutrophils Absolute: 4.8 10*3/uL (ref 1.4–7.0)
Neutrophils: 60 %
Platelets: 273 10*3/uL (ref 150–450)
RBC: 4.48 x10E6/uL (ref 3.77–5.28)
RDW: 14.2 % (ref 11.7–15.4)
WBC: 7.8 10*3/uL (ref 3.4–10.8)

## 2021-08-11 LAB — LIPID PANEL
Chol/HDL Ratio: 4.2 ratio (ref 0.0–4.4)
Cholesterol, Total: 217 mg/dL — ABNORMAL HIGH (ref 100–199)
HDL: 52 mg/dL (ref >39)
LDL Chol Calc (NIH): 118 mg/dL — ABNORMAL HIGH (ref 0–99)
Triglycerides: 269 mg/dL — ABNORMAL HIGH (ref 0–149)
VLDL Cholesterol Cal: 47 mg/dL — ABNORMAL HIGH (ref 5–40)

## 2021-08-11 LAB — COMPREHENSIVE METABOLIC PANEL
ALT: 8 IU/L (ref 0–32)
AST: 11 IU/L (ref 0–40)
Albumin/Globulin Ratio: 1.5 (ref 1.2–2.2)
Albumin: 4.3 g/dL (ref 3.8–4.8)
Alkaline Phosphatase: 66 IU/L (ref 44–121)
BUN/Creatinine Ratio: 19 (ref 9–23)
BUN: 17 mg/dL (ref 6–24)
Bilirubin Total: <0.2 mg/dL (ref 0.0–1.2)
CO2: 22 mmol/L (ref 20–29)
Calcium: 9.2 mg/dL (ref 8.7–10.2)
Chloride: 103 mmol/L (ref 96–106)
Creatinine, Ser: 0.88 mg/dL (ref 0.57–1.00)
Globulin, Total: 2.8 g/dL (ref 1.5–4.5)
Glucose: 94 mg/dL (ref 70–99)
Potassium: 4.5 mmol/L (ref 3.5–5.2)
Sodium: 139 mmol/L (ref 134–144)
Total Protein: 7.1 g/dL (ref 6.0–8.5)
eGFR: 80 mL/min/{1.73_m2} (ref >59)

## 2021-08-11 LAB — TSH: TSH: 3.53 u[IU]/mL (ref 0.450–4.500)

## 2021-08-11 LAB — HEPATITIS C ANTIBODY: Hep C Virus Ab: NONREACTIVE

## 2021-08-11 NOTE — Progress Notes (Signed)
HI Cordelia Pen. It was good to see you yesterday.  Your lab work shows that your cholesterol is elevated.  I recommend stopping the fish oil.  It can increase your Triglycerides.  I recommend following a low fat diet and exercise.  Otherwise, no concerns at this time.  The 10-year ASCVD risk score (Arnett DK, et al., 2019) is: 1%   Values used to calculate the score:     Age: 51 years     Sex: Female     Is Non-Hispanic African American: No     Diabetic: No     Tobacco smoker: No     Systolic Blood Pressure: 107 mmHg     Is BP treated: No     HDL Cholesterol: 52 mg/dL     Total Cholesterol: 217 mg/dL

## 2021-08-14 ENCOUNTER — Encounter: Payer: Self-pay | Admitting: Nurse Practitioner

## 2021-08-16 ENCOUNTER — Encounter: Payer: Self-pay | Admitting: Nurse Practitioner

## 2021-08-16 LAB — CYTOLOGY - PAP
Comment: NEGATIVE
Diagnosis: UNDETERMINED — AB
High risk HPV: NEGATIVE

## 2021-08-16 MED ORDER — DROSPIRENONE-ETHINYL ESTRADIOL 3-0.02 MG PO TABS: 1.0000 | ORAL_TABLET | Freq: Every day | ORAL | 3 refills | Status: DC

## 2021-08-16 NOTE — Progress Notes (Signed)
Hi Monica Long. Your PAP showed some atypical cells but no HPV.  We will repeat it in 1 year.  Please let me know if you have any questions.

## 2021-09-21 ENCOUNTER — Encounter: Payer: Self-pay | Admitting: Nurse Practitioner

## 2021-10-16 NOTE — Progress Notes (Unsigned)
 There were no vitals taken for this visit.   Subjective:    Patient ID: Monica Long, female    DOB: 07/24/1970, 51 y.o.   MRN: [id number]  HPI: Monica Long is a 51 y.o. female  No chief complaint on file.   Relevant past medical, surgical, family and social history reviewed and updated as indicated. Interim medical history since our last visit reviewed. Allergies and medications reviewed and updated.  Review of Systems  Per HPI unless specifically indicated above     Objective:    There were no vitals taken for this visit.  Wt Readings from Last 3 Encounters:  08/10/21 147 lb 3.2 oz (66.8 kg)  06/08/21 146 lb (66.2 kg)  04/30/21 145 lb (65.8 kg)    Physical Exam  Results for orders placed or performed in visit on 08/10/21  CBC with Differential/Platelet  Result Value Ref Range   WBC 7.8 3.4 - 10.8 x10E3/uL   RBC 4.48 3.77 - 5.28 x10E6/uL   Hemoglobin 13.2 11.1 - 15.9 g/dL   Hematocrit 38.2 34.0 - 46.6 %   MCV 85 79 - 97 fL   MCH 29.5 26.6 - 33.0 pg   MCHC 34.6 31.5 - 35.7 g/dL   RDW 14.2 11.7 - 15.4 %   Platelets 273 150 - 450 x10E3/uL   Neutrophils 60 Not Estab. %   Lymphs 31 Not Estab. %   Monocytes 6 Not Estab. %   Eos 2 Not Estab. %   Basos 1 Not Estab. %   Neutrophils Absolute 4.8 1.4 - 7.0 x10E3/uL   Lymphocytes Absolute 2.4 0.7 - 3.1 x10E3/uL   Monocytes Absolute 0.5 0.1 - 0.9 x10E3/uL   EOS (ABSOLUTE) 0.1 0.0 - 0.4 x10E3/uL   Basophils Absolute 0.1 0.0 - 0.2 x10E3/uL   Immature Granulocytes 0 Not Estab. %   Immature Grans (Abs) 0.0 0.0 - 0.1 x10E3/uL  Comprehensive metabolic panel  Result Value Ref Range   Glucose 94 70 - 99 mg/dL   BUN 17 6 - 24 mg/dL   Creatinine, Ser 0.88 0.57 - 1.00 mg/dL   eGFR 80 >59 mL/min/1.73   BUN/Creatinine Ratio 19 9 - 23   Sodium 139 134 - 144 mmol/L   Potassium 4.5 3.5 - 5.2 mmol/L   Chloride 103 96 - 106 mmol/L   CO2 22 20 - 29 mmol/L   Calcium 9.2 8.7 - 10.2 mg/dL   Total Protein 7.1 6.0 - 8.5 g/dL    Albumin 4.3 3.8 - 4.8 g/dL   Globulin, Total 2.8 1.5 - 4.5 g/dL   Albumin/Globulin Ratio 1.5 1.2 - 2.2   Bilirubin Total <0.2 0.0 - 1.2 mg/dL   Alkaline Phosphatase 66 44 - 121 IU/L   AST 11 0 - 40 IU/L   ALT 8 0 - 32 IU/L  Lipid panel  Result Value Ref Range   Cholesterol, Total 217 (H) 100 - 199 mg/dL   Triglycerides 269 (H) 0 - 149 mg/dL   HDL 52 >39 mg/dL   VLDL Cholesterol Cal 47 (H) 5 - 40 mg/dL   LDL Chol Calc (NIH) 118 (H) 0 - 99 mg/dL   Chol/HDL Ratio 4.2 0.0 - 4.4 ratio  TSH  Result Value Ref Range   TSH 3.530 0.450 - 4.500 uIU/mL  Urinalysis, Routine w reflex microscopic  Result Value Ref Range   Specific Gravity, UA 1.025 1.005 - 1.030   pH, UA 6.5 5.0 - 7.5   Color, UA Yellow Yellow   Appearance   Ur Clear Clear   Leukocytes,UA Negative Negative   Protein,UA Negative Negative/Trace   Glucose, UA Negative Negative   Ketones, UA Negative Negative   RBC, UA Negative Negative   Bilirubin, UA Negative Negative   Urobilinogen, Ur 0.2 0.2 - 1.0 mg/dL   Nitrite, UA Negative Negative  Hepatitis C Antibody  Result Value Ref Range   Hep C Virus Ab Non Reactive Non Reactive  Cytology - PAP  Result Value Ref Range   High risk HPV Negative    Adequacy      Satisfactory for evaluation; transformation zone component PRESENT.   Diagnosis (A)     - Atypical squamous cells of undetermined significance (ASC-US)   Comment      Endometrial cells present and correlate with menstrual history provided.   Comment Normal Reference Range HPV - Negative       Assessment & Plan:   Problem List Items Addressed This Visit       Other   Anxiety - Primary   Depression     Follow up plan: No follow-ups on file.      

## 2021-10-17 ENCOUNTER — Ambulatory Visit (INDEPENDENT_AMBULATORY_CARE_PROVIDER_SITE_OTHER): Payer: Managed Care, Other (non HMO) | Admitting: Nurse Practitioner

## 2021-10-17 ENCOUNTER — Encounter: Payer: Self-pay | Admitting: Nurse Practitioner

## 2021-10-17 VITALS — BP 118/76 | HR 77 | Temp 98.0°F | Wt 137.4 lb

## 2021-10-17 DIAGNOSIS — F32A Depression, unspecified: Secondary | ICD-10-CM | POA: Diagnosis not present

## 2021-10-17 DIAGNOSIS — F419 Anxiety disorder, unspecified: Secondary | ICD-10-CM | POA: Diagnosis not present

## 2021-10-17 MED ORDER — BUTALBITAL-APAP-CAFFEINE 50-325-40 MG PO TABS
1.0000 | ORAL_TABLET | Freq: Four times a day (QID) | ORAL | 0 refills | Status: DC | PRN
Start: 2021-10-17 — End: 2023-02-11

## 2021-10-17 MED ORDER — BUSPIRONE HCL 5 MG PO TABS: 5.0000 mg | ORAL_TABLET | Freq: Two times a day (BID) | ORAL | 0 refills | Status: DC

## 2021-10-17 NOTE — Assessment & Plan Note (Signed)
Chronic. Not well controlled at this time.  Will continue with Wellbutrin 300mg daily.  Will add Buspar 5mg PRN.  Discussed with patient that if she is benefiting from the medication can take it BID.  Discussed side effects and benefits of medication during visit today.  Follow up in 1 month for reevaluation.  

## 2021-10-17 NOTE — Assessment & Plan Note (Signed)
Chronic. Not well controlled at this time.  Will continue with Wellbutrin 300mg  daily.  Will add Buspar 5mg  PRN.  Discussed with patient that if she is benefiting from the medication can take it BID.  Discussed side effects and benefits of medication during visit today.  Follow up in 1 month for reevaluation.

## 2021-11-09 ENCOUNTER — Ambulatory Visit: Payer: Managed Care, Other (non HMO) | Admitting: Nurse Practitioner

## 2021-11-09 ENCOUNTER — Encounter: Payer: Self-pay | Admitting: Nurse Practitioner

## 2021-11-09 VITALS — BP 103/69 | HR 77 | Temp 97.9°F | Wt 137.1 lb

## 2021-11-09 DIAGNOSIS — N3000 Acute cystitis without hematuria: Secondary | ICD-10-CM

## 2021-11-09 DIAGNOSIS — R300 Dysuria: Secondary | ICD-10-CM

## 2021-11-09 LAB — URINALYSIS, ROUTINE W REFLEX MICROSCOPIC
Bilirubin, UA: NEGATIVE
Glucose, UA: NEGATIVE
Ketones, UA: NEGATIVE
Leukocytes,UA: NEGATIVE
Nitrite, UA: NEGATIVE
Protein,UA: NEGATIVE
RBC, UA: NEGATIVE
Specific Gravity, UA: 1.02 (ref 1.005–1.030)
Urobilinogen, Ur: 0.2 mg/dL (ref 0.2–1.0)
pH, UA: 5.5 (ref 5.0–7.5)

## 2021-11-09 MED ORDER — NITROFURANTOIN MONOHYD MACRO 100 MG PO CAPS: 100.0000 mg | ORAL_CAPSULE | Freq: Two times a day (BID) | ORAL | 0 refills | Status: DC

## 2021-11-09 NOTE — Progress Notes (Signed)
Results discussed with patient during visit.

## 2021-11-09 NOTE — Progress Notes (Signed)
BP 103/69   Pulse 77   Temp 97.9 F (36.6 C) (Oral)   Wt 137 lb 1.6 oz (62.2 kg)   LMP 10/19/2021 (Exact Date)   SpO2 99%   BMI 21.27 kg/m    Subjective:    Patient ID: Monica Long, female    DOB: 11-26-1970, 50 y.o.   MRN: [id number]  HPI: Monica Long is a 51 y.o. female  Chief Complaint  Patient presents with   urinary symptoms    Patient reports foul urine odor a few weeks ago, has noticed dark yellow urine. About 3-4 days ago noticed lower pelvic pressure and urinary urgency. Denies dysuria.    URINARY SYMPTOMS A couple of week ago her urine had a smell.  Then it went away for a few days. It came back at the beginning of the week and now she has pressure in her bladder and an urge. Dysuria: no Urinary frequency: yes Urgency: yes Small volume voids: yes Symptom severity: no Urinary incontinence: no Foul odor: yes Hematuria: no Abdominal pain: yes Back pain: no Suprapubic pain/pressure: no Flank pain: no Fever:  no Vomiting: no Relief with cranberry juice: no Relief with pyridium: no   Relevant past medical, surgical, family and social history reviewed and updated as indicated. Interim medical history since our last visit reviewed. Allergies and medications reviewed and updated.  Review of Systems  Constitutional:  Negative for fever.  Gastrointestinal:  Positive for abdominal pain. Negative for vomiting.  Genitourinary:  Positive for decreased urine volume, frequency and urgency. Negative for dysuria, flank pain and hematuria.  Musculoskeletal:  Negative for back pain.    Per HPI unless specifically indicated above     Objective:    BP 103/69   Pulse 77   Temp 97.9 F (36.6 C) (Oral)   Wt 137 lb 1.6 oz (62.2 kg)   LMP 10/19/2021 (Exact Date)   SpO2 99%   BMI 21.27 kg/m   Wt Readings from Last 3 Encounters:  11/09/21 137 lb 1.6 oz (62.2 kg)  10/17/21 137 lb 6.4 oz (62.3 kg)  08/10/21 147 lb 3.2 oz (66.8 kg)    Physical Exam Vitals  and nursing note reviewed.  Constitutional:      General: She is not in acute distress.    Appearance: Normal appearance. She is normal weight. She is not ill-appearing, toxic-appearing or diaphoretic.  HENT:     Head: Normocephalic.     Right Ear: External ear normal.     Left Ear: External ear normal.     Nose: Nose normal.     Mouth/Throat:     Mouth: Mucous membranes are moist.     Pharynx: Oropharynx is clear.  Eyes:     General:        Right eye: No discharge.        Left eye: No discharge.     Extraocular Movements: Extraocular movements intact.     Conjunctiva/sclera: Conjunctivae normal.     Pupils: Pupils are equal, round, and reactive to light.  Cardiovascular:     Rate and Rhythm: Normal rate and regular rhythm.     Heart sounds: No murmur heard. Pulmonary:     Effort: Pulmonary effort is normal. No respiratory distress.     Breath sounds: Normal breath sounds. No wheezing or rales.  Abdominal:     General: Abdomen is flat. Bowel sounds are normal. There is no distension.     Palpations: Abdomen is soft.  Tenderness: There is no abdominal tenderness. There is no right CVA tenderness, left CVA tenderness or guarding.  Musculoskeletal:     Cervical back: Normal range of motion and neck supple.  Skin:    General: Skin is warm and dry.     Capillary Refill: Capillary refill takes less than 2 seconds.  Neurological:     General: No focal deficit present.     Mental Status: She is alert and oriented to person, place, and time. Mental status is at baseline.  Psychiatric:        Mood and Affect: Mood normal.        Behavior: Behavior normal.        Thought Content: Thought content normal.        Judgment: Judgment normal.     Results for orders placed or performed in visit on 11/09/21  Urinalysis, Routine w reflex microscopic  Result Value Ref Range   Specific Gravity, UA 1.020 1.005 - 1.030   pH, UA 5.5 5.0 - 7.5   Color, UA Yellow Yellow   Appearance Ur  Clear Clear   Leukocytes,UA Negative Negative   Protein,UA Negative Negative/Trace   Glucose, UA Negative Negative   Ketones, UA Negative Negative   RBC, UA Negative Negative   Bilirubin, UA Negative Negative   Urobilinogen, Ur 0.2 0.2 - 1.0 mg/dL   Nitrite, UA Negative Negative      Assessment & Plan:   Problem List Items Addressed This Visit   None Visit Diagnoses     Acute cystitis without hematuria    -  Primary   Complete course of antibiotics. Increase fluid intake.  Follow up if symptoms do not improve.     Relevant Orders   Urine Culture   Dysuria       Relevant Orders   Urinalysis, Routine w reflex microscopic (Completed)        Follow up plan: No follow-ups on file.

## 2021-11-10 ENCOUNTER — Ambulatory Visit: Payer: Managed Care, Other (non HMO) | Admitting: Nurse Practitioner

## 2021-11-12 ENCOUNTER — Other Ambulatory Visit: Payer: Self-pay | Admitting: Nurse Practitioner

## 2021-11-13 ENCOUNTER — Encounter: Payer: Self-pay | Admitting: Nurse Practitioner

## 2021-11-13 NOTE — Telephone Encounter (Signed)
Requested medication (s) are due for refill today: yes  Requested medication (s) are on the active medication list: yes  Last refill:  10/17/21 #60 with 0 RF  Future visit scheduled: 12/07/21  Notes to clinic:  Pharm requesting 90 day supply, please assess.      Requested Prescriptions  Pending Prescriptions Disp Refills   busPIRone (BUSPAR) 5 MG tablet [Pharmacy Med Name: BUSPIRONE HCL 5 MG TABLET] 180 tablet 1    Sig: TAKE 1 TABLET BY MOUTH TWICE A DAY     Psychiatry: Anxiolytics/Hypnotics - Non-controlled Passed - 11/12/2021 10:31 AM      Passed - Valid encounter within last 12 months    Recent Outpatient Visits           4 days ago Acute cystitis without hematuria   [facility] Larae Grooms, NP   3 weeks ago Anxiety   [facility] Larae Grooms, NP   3 months ago Annual physical exam   [facility] Larae Grooms, NP   5 months ago Menometrorrhagia   [facility] Larae Grooms, NP   6 months ago COVID-19   [facility] Larae Grooms, NP       Future Appointments             In 3 weeks Larae Grooms, NP [facility]

## 2021-11-14 ENCOUNTER — Ambulatory Visit: Payer: Managed Care, Other (non HMO) | Admitting: Nurse Practitioner

## 2021-11-17 ENCOUNTER — Ambulatory Visit: Payer: Managed Care, Other (non HMO) | Admitting: Nurse Practitioner

## 2021-11-23 ENCOUNTER — Encounter: Payer: Self-pay | Admitting: Nurse Practitioner

## 2021-11-24 ENCOUNTER — Ambulatory Visit: Payer: Managed Care, Other (non HMO) | Admitting: Nurse Practitioner

## 2021-11-27 ENCOUNTER — Ambulatory Visit: Payer: Managed Care, Other (non HMO) | Admitting: Nurse Practitioner

## 2021-11-27 NOTE — Progress Notes (Deleted)
   LMP 10/19/2021 (Exact Date)    Subjective:    Patient ID: Monica Long, female    DOB: 20-Jan-1971, 51 y.o.   MRN: [id number]  HPI: Monica Long is a 51 y.o. female  No chief complaint on file.   Relevant past medical, surgical, family and social history reviewed and updated as indicated. Interim medical history since our last visit reviewed. Allergies and medications reviewed and updated.  Review of Systems  Per HPI unless specifically indicated above     Objective:    LMP 10/19/2021 (Exact Date)   Wt Readings from Last 3 Encounters:  11/09/21 137 lb 1.6 oz (62.2 kg)  10/17/21 137 lb 6.4 oz (62.3 kg)  08/10/21 147 lb 3.2 oz (66.8 kg)    Physical Exam  Results for orders placed or performed in visit on 11/09/21  Urinalysis, Routine w reflex microscopic  Result Value Ref Range   Specific Gravity, UA 1.020 1.005 - 1.030   pH, UA 5.5 5.0 - 7.5   Color, UA Yellow Yellow   Appearance Ur Clear Clear   Leukocytes,UA Negative Negative   Protein,UA Negative Negative/Trace   Glucose, UA Negative Negative   Ketones, UA Negative Negative   RBC, UA Negative Negative   Bilirubin, UA Negative Negative   Urobilinogen, Ur 0.2 0.2 - 1.0 mg/dL   Nitrite, UA Negative Negative      Assessment & Plan:   Problem List Items Addressed This Visit   None    Follow up plan: No follow-ups on file.

## 2021-12-07 ENCOUNTER — Ambulatory Visit: Payer: Managed Care, Other (non HMO) | Admitting: Nurse Practitioner

## 2021-12-07 ENCOUNTER — Encounter: Payer: Self-pay | Admitting: Nurse Practitioner

## 2021-12-07 DIAGNOSIS — F419 Anxiety disorder, unspecified: Secondary | ICD-10-CM | POA: Diagnosis not present

## 2021-12-07 DIAGNOSIS — F32A Depression, unspecified: Secondary | ICD-10-CM | POA: Diagnosis not present

## 2021-12-07 MED ORDER — YAZ 3-0.02 MG PO TABS: 1.0000 | ORAL_TABLET | Freq: Every day | ORAL | 3 refills | Status: DC

## 2021-12-07 MED ORDER — TOPIRAMATE 25 MG PO TABS: 25.0000 mg | ORAL_TABLET | Freq: Three times a day (TID) | ORAL | 1 refills | Status: DC

## 2021-12-07 MED ORDER — BUPROPION HCL ER (XL) 300 MG PO TB24: 300.0000 mg | ORAL_TABLET | Freq: Every day | ORAL | 1 refills | Status: DC

## 2021-12-07 MED ORDER — BUSPIRONE HCL 5 MG PO TABS
5.0000 mg | ORAL_TABLET | Freq: Two times a day (BID) | ORAL | 1 refills | Status: DC
Start: 2021-12-07 — End: 2022-07-03

## 2021-12-07 NOTE — Progress Notes (Signed)
BP 127/79   Pulse 76   Temp 98.5 F (36.9 C) (Oral)   Wt 135 lb 8 oz (61.5 kg)   LMP 10/19/2021 (Exact Date)   SpO2 100%   BMI 21.02 kg/m    Subjective:    Patient ID: Monica Long, female    DOB: Apr 09, 1970, 51 y.o.   MRN: [id number]  HPI: Monica Long is a 51 y.o. female  Chief Complaint  Patient presents with   Anxiety    Patient reports doing well with medication   DEPRESSION/ANXIETY Patient states she feels like her anxiey is better.  Using the Buspar PRN.  Usually 2-3 times per week.  Feels like this    Flowsheet Row Office Visit from 12/07/2021 in [facility]  PHQ-9 Total Score 1         12/07/2021    4:08 PM 11/09/2021    2:41 PM 10/17/2021    9:19 AM 08/10/2021    8:09 AM  GAD 7 : Generalized Anxiety Score  Nervous, Anxious, on Edge 1 0 1 0  Control/stop worrying 0 1 1 0  Worry too much - different things 0 0 1 0  Trouble relaxing 0 0 1 0  Restless 0 0 0 0  Easily annoyed or irritable 0 0 1 0  Afraid - awful might happen 0 0 0 0  Total GAD 7 Score 1 1 5  0  Anxiety Difficulty Somewhat difficult Somewhat difficult Somewhat difficult Not difficult at all     Relevant past medical, surgical, family and social history reviewed and updated as indicated. Interim medical history since our last visit reviewed. Allergies and medications reviewed and updated.  Review of Systems  Psychiatric/Behavioral:  Positive for dysphoric mood. Negative for suicidal ideas. The patient is nervous/anxious.     Per HPI unless specifically indicated above     Objective:    BP 127/79   Pulse 76   Temp 98.5 F (36.9 C) (Oral)   Wt 135 lb 8 oz (61.5 kg)   LMP 10/19/2021 (Exact Date)   SpO2 100%   BMI 21.02 kg/m   Wt Readings from Last 3 Encounters:  12/07/21 135 lb 8 oz (61.5 kg)  11/09/21 137 lb 1.6 oz (62.2 kg)  10/17/21 137 lb 6.4 oz (62.3 kg)    Physical Exam Vitals and nursing note reviewed.  Constitutional:      General: She is not in  acute distress.    Appearance: Normal appearance. She is normal weight. She is not ill-appearing, toxic-appearing or diaphoretic.  HENT:     Head: Normocephalic.     Right Ear: External ear normal.     Left Ear: External ear normal.     Nose: Nose normal.     Mouth/Throat:     Mouth: Mucous membranes are moist.     Pharynx: Oropharynx is clear.  Eyes:     General:        Right eye: No discharge.        Left eye: No discharge.     Extraocular Movements: Extraocular movements intact.     Conjunctiva/sclera: Conjunctivae normal.     Pupils: Pupils are equal, round, and reactive to light.  Cardiovascular:     Rate and Rhythm: Normal rate and regular rhythm.     Heart sounds: No murmur heard. Pulmonary:     Effort: Pulmonary effort is normal. No respiratory distress.     Breath sounds: Normal breath sounds. No wheezing or rales.  Musculoskeletal:     Cervical back: Normal range of motion and neck supple.  Skin:    General: Skin is warm and dry.     Capillary Refill: Capillary refill takes less than 2 seconds.  Neurological:     General: No focal deficit present.     Mental Status: She is alert and oriented to person, place, and time. Mental status is at baseline.  Psychiatric:        Mood and Affect: Mood normal.        Behavior: Behavior normal.        Thought Content: Thought content normal.        Judgment: Judgment normal.     Results for orders placed or performed in visit on 11/09/21  Urinalysis, Routine w reflex microscopic  Result Value Ref Range   Specific Gravity, UA 1.020 1.005 - 1.030   pH, UA 5.5 5.0 - 7.5   Color, UA Yellow Yellow   Appearance Ur Clear Clear   Leukocytes,UA Negative Negative   Protein,UA Negative Negative/Trace   Glucose, UA Negative Negative   Ketones, UA Negative Negative   RBC, UA Negative Negative   Bilirubin, UA Negative Negative   Urobilinogen, Ur 0.2 0.2 - 1.0 mg/dL   Nitrite, UA Negative Negative      Assessment & Plan:    Problem List Items Addressed This Visit       Other   Anxiety    Chronic.  Improved  Continue with current medication regimen with Wellbutrin and Buspar PRN.  Refills sent today.  Return to clinic in 3 months for reevaluation.  Call sooner if concerns arise.        Relevant Medications   busPIRone (BUSPAR) 5 MG tablet   buPROPion (WELLBUTRIN XL) 300 MG 24 hr tablet   Depression    Chronic.  Improved  Continue with current medication regimen with Wellbutrin and Buspar PRN.  Refills sent today.  Return to clinic in 3 months for reevaluation.  Call sooner if concerns arise.        Relevant Medications   busPIRone (BUSPAR) 5 MG tablet   buPROPion (WELLBUTRIN XL) 300 MG 24 hr tablet     Follow up plan: Return in about 3 months (around 03/08/2022) for Depression/Anxiety FU.

## 2021-12-07 NOTE — Progress Notes (Deleted)
   Wt 135 lb 8 oz (61.5 kg)   LMP 10/19/2021 (Exact Date)   BMI 21.02 kg/m    Subjective:    Patient ID: Monica Long, female    DOB: March 18, 1971, 51 y.o.   MRN: [id number]  HPI: Monica Long is a 51 y.o. female  No chief complaint on file.   Relevant past medical, surgical, family and social history reviewed and updated as indicated. Interim medical history since our last visit reviewed. Allergies and medications reviewed and updated.  Review of Systems  Per HPI unless specifically indicated above     Objective:    Wt 135 lb 8 oz (61.5 kg)   LMP 10/19/2021 (Exact Date)   BMI 21.02 kg/m   Wt Readings from Last 3 Encounters:  12/07/21 135 lb 8 oz (61.5 kg)  11/09/21 137 lb 1.6 oz (62.2 kg)  10/17/21 137 lb 6.4 oz (62.3 kg)    Physical Exam  Results for orders placed or performed in visit on 11/09/21  Urinalysis, Routine w reflex microscopic  Result Value Ref Range   Specific Gravity, UA 1.020 1.005 - 1.030   pH, UA 5.5 5.0 - 7.5   Color, UA Yellow Yellow   Appearance Ur Clear Clear   Leukocytes,UA Negative Negative   Protein,UA Negative Negative/Trace   Glucose, UA Negative Negative   Ketones, UA Negative Negative   RBC, UA Negative Negative   Bilirubin, UA Negative Negative   Urobilinogen, Ur 0.2 0.2 - 1.0 mg/dL   Nitrite, UA Negative Negative      Assessment & Plan:   Problem List Items Addressed This Visit   None    Follow up plan: No follow-ups on file.

## 2021-12-08 ENCOUNTER — Encounter: Payer: Self-pay | Admitting: Nurse Practitioner

## 2021-12-08 NOTE — Assessment & Plan Note (Signed)
Chronic.  Improved  Continue with current medication regimen with Wellbutrin and Buspar PRN.  Refills sent today.  Return to clinic in 3 months for reevaluation.  Call sooner if concerns arise.

## 2021-12-08 NOTE — Assessment & Plan Note (Signed)
Chronic.  Improved  Continue with current medication regimen with Wellbutrin and Buspar PRN.  Refills sent today.  Return to clinic in 3 months for reevaluation.  Call sooner if concerns arise.   

## 2021-12-11 MED ORDER — DROSPIRENONE-ETHINYL ESTRADIOL 3-0.03 MG PO TABS: 1.0000 | ORAL_TABLET | Freq: Every day | ORAL | 3 refills | Status: DC

## 2021-12-25 MED ORDER — DROSPIRENONE-ETHINYL ESTRADIOL 3-0.03 MG PO TABS: 1.0000 | ORAL_TABLET | Freq: Every day | ORAL | 3 refills | Status: DC

## 2021-12-27 MED ORDER — DROSPIRENONE-ETHINYL ESTRADIOL 3-0.03 MG PO TABS: 1.0000 | ORAL_TABLET | Freq: Every day | ORAL | 3 refills | Status: DC

## 2021-12-28 ENCOUNTER — Encounter: Payer: Self-pay | Admitting: Nurse Practitioner

## 2021-12-29 MED ORDER — DROSPIRENONE-ETHINYL ESTRADIOL 3-0.02 MG PO TABS: 1.0000 | ORAL_TABLET | Freq: Every day | ORAL | 3 refills | Status: DC

## 2022-01-05 ENCOUNTER — Encounter: Payer: Self-pay | Admitting: Physician Assistant

## 2022-01-05 ENCOUNTER — Ambulatory Visit: Payer: Managed Care, Other (non HMO) | Admitting: Physician Assistant

## 2022-01-05 VITALS — BP 109/74 | HR 70 | Temp 98.3°F | Wt 137.6 lb

## 2022-01-05 DIAGNOSIS — J029 Acute pharyngitis, unspecified: Secondary | ICD-10-CM | POA: Diagnosis not present

## 2022-01-05 DIAGNOSIS — J069 Acute upper respiratory infection, unspecified: Secondary | ICD-10-CM

## 2022-01-05 NOTE — Progress Notes (Signed)
Acute Office Visit   Patient: Monica Long   DOB: 1971-02-27   51 y.o. Female  MRN: [id number] Visit Date: 01/05/2022  Today's healthcare provider: Oswaldo Conroy Cliffard Hair, PA-C  Introduced myself to the patient as a Secondary school teacher and provided education on APPs in clinical practice.    Chief Complaint  Patient presents with   Sore Throat    Pt states she has been having a sore throat, headache, sinus pressure, and a headache for the last 2 days    Subjective    Sore Throat  Associated symptoms include congestion, coughing, headaches and trouble swallowing. Pertinent negatives include no diarrhea, ear pain, shortness of breath or vomiting.   HPI     Sore Throat    Additional comments: Pt states she has been having a sore throat, headache, sinus pressure, and a headache for the last 2 days       Last edited by Pablo Ledger, CMA on 01/05/2022  4:08 PM.       Reports around Wed she started having an itchy throat She states she has had a lot of sinus congestion for the past 2 weeks She states she has had body aches and headaches  Interventions: Nyquil and Ibuprofen Reports some relief with Nyquil  Sick contacts: none  Reports mild dry cough along with some intermittent chest tightness    Medications: Outpatient Medications Prior to Visit  Medication Sig   azelastine (ASTELIN) 0.1 % nasal spray PLACE 1 SPRAY INTO BOTH NOSTRILS 2 (TWO) TIMES DAILY. USE IN EACH NOSTRIL AS DIRECTED   buPROPion (WELLBUTRIN XL) 300 MG 24 hr tablet Take 1 tablet (300 mg total) by mouth daily.   busPIRone (BUSPAR) 5 MG tablet Take 1 tablet (5 mg total) by mouth 2 (two) times daily.   butalbital-acetaminophen-caffeine (FIORICET) 50-325-40 MG tablet Take 1 tablet by mouth every 6 (six) hours as needed for headache.   drospirenone-ethinyl estradiol (NIKKI) 3-0.02 MG tablet Take 1 tablet by mouth daily.   fluticasone (FLONASE) 50 MCG/ACT nasal spray SPRAY 2 SPRAYS INTO EACH NOSTRIL EVERY DAY    Omega-3 1000 MG CAPS Take 1,000 mg by mouth daily.   topiramate (TOPAMAX) 25 MG tablet Take 1 tablet (25 mg total) by mouth 3 (three) times daily.   No facility-administered medications prior to visit.    Review of Systems  Constitutional:  Negative for chills, fatigue and fever.  HENT:  Positive for congestion, sore throat and trouble swallowing. Negative for ear pain, postnasal drip, rhinorrhea, sinus pressure and sinus pain.   Respiratory:  Positive for cough and chest tightness. Negative for shortness of breath and wheezing.   Gastrointestinal:  Negative for diarrhea, nausea and vomiting.  Musculoskeletal:  Positive for myalgias.  Neurological:  Positive for headaches. Negative for dizziness and light-headedness.       Objective    BP 109/74   Pulse 70   Temp 98.3 F (36.8 C) (Oral)   Wt 137 lb 9.6 oz (62.4 kg)   SpO2 97%   BMI 21.34 kg/m    Physical Exam Vitals reviewed.  Constitutional:      General: She is awake.     Appearance: Normal appearance. She is well-developed, well-groomed and normal weight.  HENT:     Head: Normocephalic and atraumatic.     Right Ear: Tympanic membrane, ear canal and external ear normal.     Left Ear: Tympanic membrane, ear canal and external ear normal.  Mouth/Throat:     Lips: Pink.     Pharynx: Oropharynx is clear. Uvula midline. Posterior oropharyngeal erythema present. No pharyngeal swelling, oropharyngeal exudate or uvula swelling.  Eyes:     General: Lids are normal. Gaze aligned appropriately.     Extraocular Movements: Extraocular movements intact.     Conjunctiva/sclera: Conjunctivae normal.  Cardiovascular:     Rate and Rhythm: Regular rhythm.     Pulses: Normal pulses.     Heart sounds: Normal heart sounds. No murmur heard.    No friction rub. No gallop.  Pulmonary:     Effort: Pulmonary effort is normal.     Breath sounds: Normal breath sounds. No decreased air movement. No decreased breath sounds, wheezing, rhonchi  or rales.  Musculoskeletal:     Cervical back: Normal range of motion and neck supple.     Right lower leg: No edema.     Left lower leg: No edema.  Lymphadenopathy:     Head:     Right side of head: No submental, submandibular or preauricular adenopathy.     Left side of head: No submental, submandibular or preauricular adenopathy.     Cervical:     Right cervical: No superficial or posterior cervical adenopathy.    Left cervical: No superficial or posterior cervical adenopathy.     Upper Body:     Right upper body: No supraclavicular adenopathy.     Left upper body: No supraclavicular adenopathy.  Neurological:     Mental Status: She is alert.  Psychiatric:        Attention and Perception: Attention and perception normal.        Mood and Affect: Mood and affect normal.        Speech: Speech normal.        Behavior: Behavior normal. Behavior is cooperative.       No results found for any visits on 01/05/22.  Assessment & Plan      No follow-ups on file.     Problem List Items Addressed This Visit   None Visit Diagnoses     Viral upper respiratory tract infection    -  Primary Acute, new concern Visit with patient indicates symptoms comprised of sore throat, mild coughing, chest tightness and body aches  since Wed congruent with acute URI that is likely viral in nature  Will test for COVID, Flu and Strep today - negative for flu and rapid strep negative  Results from COVID and strep culture to dictate further management once they return Due to nature and duration of symptoms recommended treatment regimen is symptomatic relief and follow up if needed Discussed with patient the various viral and bacterial etiologies of current illness and appropriate course of treatment Discussed OTC medication options for multisymptom relief such as Dayquil/Nyquil, Theraflu, AlkaSeltzer, etc. Discussed return precautions if symptoms are not improving or worsen over next 5-7 days.      Sore throat     Acute, new concern See URI A&P for management plan   Relevant Orders   Rapid Strep screen([facility])   Veritor Flu A/B Waived   Novel Coronavirus, NAA ([facility])        No follow-ups on file.   I, Keneth Borg E Tailynn Armetta, PA-C, have reviewed all documentation for this visit. The documentation on 01/05/22 for the exam, diagnosis, procedures, and orders are all accurate and complete.   Talitha Givens, MHS, PA-C [facility]

## 2022-01-05 NOTE — Patient Instructions (Signed)
Based on your described symptoms and the duration of symptoms it is likely that you have a viral upper respiratory infection (often called a "cold")  Symptoms can last for 3-10 days with lingering cough and intermittent symptoms lasting weeks after that.  The goal of treatment at this time is to reduce your symptoms and discomfort   You can add Zyrtec to your daily regimen to help with nasal congestion and allergies   We will keep you updated on the results of your testing and call in antivirals as indicated by the results   You can use over the counter medications such as Dayquil/Nyquil, AlkaSeltzer formulations, etc to provide further relief of symptoms according to the manufacturer's instructions  If preferred you can use Coricidin to manage your symptoms rather than those medications mentioned above.    If your symptoms do not improve or become worse in the next 5-7 days please make an apt at the office so we can see you  Go to the ER if you begin to have more serious symptoms such as shortness of breath, trouble breathing, loss of consciousness, swelling around the eyes, high fever, severe lasting headaches, vision changes or neck pain/stiffness.

## 2022-01-06 LAB — NOVEL CORONAVIRUS, NAA: SARS-CoV-2, NAA: NOT DETECTED

## 2022-01-09 LAB — VERITOR FLU A/B WAIVED
Influenza A: NEGATIVE
Influenza B: NEGATIVE

## 2022-01-09 LAB — CULTURE, GROUP A STREP: Strep A Culture: NEGATIVE

## 2022-01-09 LAB — RAPID STREP SCREEN (MED CTR MEBANE ONLY): Strep Gp A Ag, IA W/Reflex: NEGATIVE

## 2022-01-12 ENCOUNTER — Encounter: Payer: Self-pay | Admitting: Physician Assistant

## 2022-01-12 DIAGNOSIS — J019 Acute sinusitis, unspecified: Secondary | ICD-10-CM

## 2022-01-12 MED ORDER — DOXYCYCLINE HYCLATE 100 MG PO TABS: 100.0000 mg | ORAL_TABLET | Freq: Two times a day (BID) | ORAL | 0 refills | Status: AC

## 2022-01-15 ENCOUNTER — Encounter (INDEPENDENT_AMBULATORY_CARE_PROVIDER_SITE_OTHER): Payer: Self-pay

## 2022-01-25 ENCOUNTER — Other Ambulatory Visit: Payer: Self-pay | Admitting: Family Medicine

## 2022-01-25 DIAGNOSIS — K2960 Other gastritis without bleeding: Secondary | ICD-10-CM

## 2022-02-26 ENCOUNTER — Encounter: Payer: Self-pay | Admitting: Nurse Practitioner

## 2022-03-08 ENCOUNTER — Ambulatory Visit: Payer: Managed Care, Other (non HMO) | Admitting: Nurse Practitioner

## 2022-04-05 ENCOUNTER — Other Ambulatory Visit: Payer: Self-pay | Admitting: Nurse Practitioner

## 2022-04-05 DIAGNOSIS — F32A Depression, unspecified: Secondary | ICD-10-CM

## 2022-04-05 NOTE — Telephone Encounter (Signed)
Unable to refill per protocol, Rx request is too soon. Last refill 12/07/21 for 90 and 1 refill.  Requested Prescriptions  Pending Prescriptions Disp Refills   topiramate (TOPAMAX) 25 MG tablet [Pharmacy Med Name: Topiramate 25 MG Oral Tablet] 270 tablet 3    Sig: TAKE 1 TABLET BY MOUTH 3 TIMES  DAILY     Neurology: Anticonvulsants - topiramate & zonisamide Passed - 04/05/2022  2:14 AM      Passed - Cr in normal range and within 360 days    Creatinine, Ser  Date Value Ref Range Status  08/10/2021 0.88 0.57 - 1.00 mg/dL Final         Passed - CO2 in normal range and within 360 days    CO2  Date Value Ref Range Status  08/10/2021 22 20 - 29 mmol/L Final         Passed - ALT in normal range and within 360 days    ALT  Date Value Ref Range Status  08/10/2021 8 0 - 32 IU/L Final         Passed - AST in normal range and within 360 days    AST  Date Value Ref Range Status  08/10/2021 11 0 - 40 IU/L Final         Passed - Completed PHQ-2 or PHQ-9 in the last 360 days      Passed - Valid encounter within last 12 months    Recent Outpatient Visits           3 months ago Viral upper respiratory tract infection   [facility] Mecum, Erin E, PA-C   3 months ago Depression, unspecified depression type   [facility] Jon Billings, NP   4 months ago Acute cystitis without hematuria   [facility] Jon Billings, NP   5 months ago [place], Karen, NP   7 months ago Annual physical exam   [place], NP       Future Appointments             In 1 week Jon Billings, NP [place]             buPROPion (WELLBUTRIN XL) 300 MG 24 hr tablet [Pharmacy Med Name: buPROPion HCl ER (XL) 300 MG Oral Tablet Extended Release 24 Hour] 90 tablet 3    Sig: TAKE 1 TABLET BY MOUTH DAILY     Psychiatry: Antidepressants - bupropion Passed - 04/05/2022  2:14  AM      Passed - Cr in normal range and within 360 days    Creatinine, Ser  Date Value Ref Range Status  08/10/2021 0.88 0.57 - 1.00 mg/dL Final         Passed - AST in normal range and within 360 days    AST  Date Value Ref Range Status  08/10/2021 11 0 - 40 IU/L Final         Passed - ALT in normal range and within 360 days    ALT  Date Value Ref Range Status  08/10/2021 8 0 - 32 IU/L Final         Passed - Completed PHQ-2 or PHQ-9 in the last 360 days      Passed - Last BP in normal range    BP Readings from Last 1 Encounters:  01/05/22 109/74         Passed - Valid encounter within last  6 months    Recent Outpatient Visits           3 months ago Viral upper respiratory tract infection   [facility] Mecum, Erin E, PA-C   3 months ago Depression, unspecified depression type   [facility] Jon Billings, NP   4 months ago Acute cystitis without hematuria   [place], NP   5 months ago [place], Karen, NP   7 months ago Annual physical exam   [facility] Jon Billings, NP       Future Appointments             In 1 week Jon Billings, NP [facility] [place]

## 2022-04-17 ENCOUNTER — Ambulatory Visit: Payer: Managed Care, Other (non HMO) | Admitting: Nurse Practitioner

## 2022-04-24 ENCOUNTER — Ambulatory Visit: Payer: Managed Care, Other (non HMO) | Admitting: Nurse Practitioner

## 2022-05-12 ENCOUNTER — Encounter: Payer: Self-pay | Admitting: Nurse Practitioner

## 2022-05-15 ENCOUNTER — Ambulatory Visit: Payer: Managed Care, Other (non HMO) | Admitting: Nurse Practitioner

## 2022-05-15 ENCOUNTER — Encounter: Payer: Self-pay | Admitting: Nurse Practitioner

## 2022-05-15 VITALS — BP 103/70 | HR 75 | Temp 98.0°F | Wt 140.7 lb

## 2022-05-15 DIAGNOSIS — J019 Acute sinusitis, unspecified: Secondary | ICD-10-CM

## 2022-05-15 DIAGNOSIS — G43719 Chronic migraine without aura, intractable, without status migrainosus: Secondary | ICD-10-CM

## 2022-05-15 DIAGNOSIS — F32A Depression, unspecified: Secondary | ICD-10-CM | POA: Diagnosis not present

## 2022-05-15 DIAGNOSIS — F419 Anxiety disorder, unspecified: Secondary | ICD-10-CM

## 2022-05-15 DIAGNOSIS — J00 Acute nasopharyngitis [common cold]: Secondary | ICD-10-CM | POA: Diagnosis not present

## 2022-05-15 MED ORDER — TOPIRAMATE 100 MG PO TABS: 100.0000 mg | ORAL_TABLET | Freq: Every day | ORAL | 1 refills | Status: DC

## 2022-05-15 MED ORDER — FLUTICASONE PROPIONATE 50 MCG/ACT NA SUSP: NASAL | 1 refills | Status: DC

## 2022-05-15 MED ORDER — DOXYCYCLINE HYCLATE 100 MG PO TABS: 100.0000 mg | ORAL_TABLET | Freq: Two times a day (BID) | ORAL | 0 refills | Status: DC

## 2022-05-15 NOTE — Assessment & Plan Note (Signed)
Chronic.  Controlled.  Continue with current medication regimen of Wellbutrin '300mg'$  daily and PRN Buspar.  Return to clinic in 3 months for reevaluation.  Call sooner if concerns arise.

## 2022-05-15 NOTE — Progress Notes (Signed)
BP 103/70   Pulse 75   Temp 98 F (36.7 C) (Oral)   Wt 140 lb 11.2 oz (63.8 kg)   SpO2 98%   BMI 21.83 kg/m    Subjective:    Patient ID: Monica Long, female    DOB: 29-Oct-1970, 52 y.o.   MRN: [id number]  HPI: Monica Long is a 52 y.o. female  Chief Complaint  Patient presents with   Ear Pain    Pt states she has been having bilateral ear pain and drainage for the last week. States the L is worse than the R.    EAR PAIN Duration:  1 week Involved ear(s): bilateral Severity:  5/10  Quality:  aching Fever: no Otorrhea: yes Upper respiratory infection symptoms: yes Pruritus: no Hearing loss: no Water immersion no Using Q-tips: yes Recurrent otitis media: no Status: stable Treatments attempted: none  MOOD Patient states she feels like her mood is better.  Still taking Wellbutrin everyday.  Rarely takes the Buspar.  Feels like if her migraines could be better controlled she would feel better overall.  Denies SI.   [place] Office Visit from 05/15/2022 in [place]  PHQ-9 Total Score 3         05/15/2022    1:44 PM 01/05/2022    4:11 PM 12/07/2021    4:08 PM 11/09/2021    2:41 PM  GAD 7 : Generalized Anxiety Score  Nervous, Anxious, on Edge 0 0 1 0  Control/stop worrying 0 0 0 1  Worry too much - different things 0 0 0 0  Trouble relaxing 0 0 0 0  Restless 0 0 0 0  Easily annoyed or irritable 0 0 0 0  Afraid - awful might happen 0 0 0 0  Total GAD 7 Score 0 0 1 1  Anxiety Difficulty Not difficult at all Not difficult at all Somewhat difficult Somewhat difficult      Relevant past medical, surgical, family and social history reviewed and updated as indicated. Interim medical history since our last visit reviewed. Allergies and medications reviewed and updated.  Review of Systems  Constitutional:  Negative for fatigue and fever.  HENT:  Positive for congestion, ear pain, sinus pressure and sinus pain. Negative for  dental problem, postnasal drip, rhinorrhea, sneezing and sore throat.   Respiratory:  Negative for cough, shortness of breath and wheezing.   Cardiovascular:  Negative for chest pain.  Gastrointestinal:  Negative for vomiting.  Skin:  Negative for rash.  Neurological:  Positive for headaches.    Per HPI unless specifically indicated above     Objective:    BP 103/70   Pulse 75   Temp 98 F (36.7 C) (Oral)   Wt 140 lb 11.2 oz (63.8 kg)   SpO2 98%   BMI 21.83 kg/m   Wt Readings from Last 3 Encounters:  05/15/22 140 lb 11.2 oz (63.8 kg)  01/05/22 137 lb 9.6 oz (62.4 kg)  12/07/21 135 lb 8 oz (61.5 kg)    Physical Exam Vitals and nursing note reviewed.  Constitutional:      General: She is not in acute distress.    Appearance: Normal appearance. She is normal weight. She is not ill-appearing, toxic-appearing or diaphoretic.  HENT:     Head: Normocephalic.     Right Ear: External ear normal. A middle ear effusion is present.     Left Ear: External ear normal. A middle ear effusion is present.  Nose:     Right Sinus: Frontal sinus tenderness present. No maxillary sinus tenderness.     Left Sinus: Frontal sinus tenderness present. No maxillary sinus tenderness.     Mouth/Throat:     Mouth: Mucous membranes are moist.     Pharynx: Oropharynx is clear.  Eyes:     General:        Right eye: No discharge.        Left eye: No discharge.     Extraocular Movements: Extraocular movements intact.     Conjunctiva/sclera: Conjunctivae normal.     Pupils: Pupils are equal, round, and reactive to light.  Cardiovascular:     Rate and Rhythm: Normal rate and regular rhythm.     Heart sounds: No murmur heard. Pulmonary:     Effort: Pulmonary effort is normal. No respiratory distress.     Breath sounds: Normal breath sounds. No wheezing or rales.  Musculoskeletal:     Cervical back: Normal range of motion and neck supple.  Skin:    General: Skin is warm and dry.     Capillary  Refill: Capillary refill takes less than 2 seconds.  Neurological:     General: No focal deficit present.     Mental Status: She is alert and oriented to person, place, and time. Mental status is at baseline.  Psychiatric:        Mood and Affect: Mood normal.        Behavior: Behavior normal.        Thought Content: Thought content normal.        Judgment: Judgment normal.     Results for orders placed or performed in visit on 01/05/22  Rapid Strep screen([facility])   Specimen: Other   Other  Result Value Ref Range   Strep Gp A Ag, IA W/Reflex Negative Negative  Novel Coronavirus, NAA ([facility])   Specimen: Nasopharyngeal(NP) swabs in vial transport medium  Result Value Ref Range   SARS-CoV-2, NAA Not Detected Not Detected  Culture, Group A Strep   Other  Result Value Ref Range   Strep A Culture Negative   Veritor Flu A/B Waived  Result Value Ref Range   Influenza A Negative Negative   Influenza B Negative Negative      Assessment & Plan:   Problem List Items Addressed This Visit       Cardiovascular and Mediastinum   Intractable chronic migraine without aura and without status migrainosus    Chronic. Exacerbated.  Will increase Topamax to '100mg'$  BID.  Increase from '50mg'$  to '100mg'$  at bedtime for two weeks then increase morning dose from '50mg'$  to '100mg'$ .  Follow up in 1 month.  Call sooner if concerns arise.       Relevant Medications   topiramate (TOPAMAX) 100 MG tablet     Other   Anxiety    Chronic.  Controlled.  Continue with current medication regimen of Wellbutrin '300mg'$  daily and PRN Buspar.  Return to clinic in 3 months for reevaluation.  Call sooner if concerns arise.       Depression    Chronic.  Controlled.  Continue with current medication regimen of Wellbutrin '300mg'$  daily and PRN Buspar.  Return to clinic in 3 months for reevaluation.  Call sooner if concerns arise.        Other Visit Diagnoses     Acute sinusitis, recurrence not specified,  unspecified location    -  Primary   Will treat with Doxycyline. Complete course of antibiotics.  Follow up if symptoms not improved.   Relevant Medications   doxycycline (VIBRA-TABS) 100 MG tablet   fluticasone (FLONASE) 50 MCG/ACT nasal spray   Acute rhinitis       Relevant Medications   fluticasone (FLONASE) 50 MCG/ACT nasal spray        Follow up plan: Return in about 1 month (around 06/13/2022) for Migraines (virtual).

## 2022-05-15 NOTE — Assessment & Plan Note (Signed)
Chronic. Exacerbated.  Will increase Topamax to '100mg'$  BID.  Increase from '50mg'$  to '100mg'$  at bedtime for two weeks then increase morning dose from '50mg'$  to '100mg'$ .  Follow up in 1 month.  Call sooner if concerns arise.

## 2022-05-16 ENCOUNTER — Ambulatory Visit: Payer: Managed Care, Other (non HMO) | Admitting: Nurse Practitioner

## 2022-05-17 ENCOUNTER — Encounter: Payer: Self-pay | Admitting: Nurse Practitioner

## 2022-05-22 ENCOUNTER — Ambulatory Visit: Payer: Managed Care, Other (non HMO) | Admitting: Nurse Practitioner

## 2022-05-24 ENCOUNTER — Ambulatory Visit: Payer: Managed Care, Other (non HMO) | Admitting: Nurse Practitioner

## 2022-06-13 ENCOUNTER — Ambulatory Visit: Payer: Managed Care, Other (non HMO) | Admitting: Nurse Practitioner

## 2022-06-29 ENCOUNTER — Encounter: Payer: Self-pay | Admitting: Nurse Practitioner

## 2022-07-03 ENCOUNTER — Ambulatory Visit: Payer: Managed Care, Other (non HMO) | Admitting: Nurse Practitioner

## 2022-07-03 ENCOUNTER — Encounter: Payer: Self-pay | Admitting: Nurse Practitioner

## 2022-07-03 VITALS — BP 115/72 | HR 81 | Temp 98.3°F | Wt 133.2 lb

## 2022-07-03 DIAGNOSIS — F419 Anxiety disorder, unspecified: Secondary | ICD-10-CM

## 2022-07-03 DIAGNOSIS — G43719 Chronic migraine without aura, intractable, without status migrainosus: Secondary | ICD-10-CM | POA: Diagnosis not present

## 2022-07-03 MED ORDER — TOPIRAMATE 50 MG PO TABS: 50.0000 mg | ORAL_TABLET | Freq: Every day | ORAL | 1 refills | Status: DC

## 2022-07-03 MED ORDER — BUSPIRONE HCL 10 MG PO TABS: 10.0000 mg | ORAL_TABLET | Freq: Two times a day (BID) | ORAL | 1 refills | Status: DC

## 2022-07-03 NOTE — Progress Notes (Unsigned)
BP 115/72   Pulse 81   Temp 98.3 F (36.8 C) (Oral)   Wt 133 lb 3.2 oz (60.4 kg)   SpO2 98%   BMI 20.66 kg/m    Subjective:    Patient ID: Monica Long, female    DOB: [birth year], 52 y.o.   MRN: [id number]  HPI: Monica Long is a 52 y.o. female  Chief Complaint  Patient presents with   Migraine   Anxiety    Pt states she would like to discuss possibly increasing the Buspar prescription    MIGRAINES Patient states she feels like the increased dose of the Topamax was too much for her.   Duration: chronic Onset: sudden Severity: 7/10   ANXIETY Patient states she has a lot going on in her life.  She feels a heaviness in her chest.  She is not sure if this is related to anxiety or possible the increased dose of Topamax.  However, she does feel like she has been worse lately and feels like increasing the Topamax would be helpful.   Flowsheet Row Office Visit from 07/03/2022 in [facility]  PHQ-9 Total Score 1         07/03/2022    4:03 PM 05/15/2022    1:44 PM 01/05/2022    4:11 PM 12/07/2021    4:08 PM  GAD 7 : Generalized Anxiety Score  Nervous, Anxious, on Edge 0 0 0 1  Control/stop worrying 0 0 0 0  Worry too much - different things 0 0 0 0  Trouble relaxing 0 0 0 0  Restless 0 0 0 0  Easily annoyed or irritable 0 0 0 0  Afraid - awful might happen 0 0 0 0  Total GAD 7 Score 0 0 0 1  Anxiety Difficulty Not difficult at all Not difficult at all Not difficult at all Somewhat difficult       Relevant past medical, surgical, family and social history reviewed and updated as indicated. Interim medical history since our last visit reviewed. Allergies and medications reviewed and updated.  Review of Systems  Neurological:  Positive for headaches.  Psychiatric/Behavioral:  Negative for dysphoric mood and suicidal ideas. The patient is nervous/anxious.     Per HPI unless specifically indicated above     Objective:    BP 115/72    Pulse 81   Temp 98.3 F (36.8 C) (Oral)   Wt 133 lb 3.2 oz (60.4 kg)   SpO2 98%   BMI 20.66 kg/m   Wt Readings from Last 3 Encounters:  07/03/22 133 lb 3.2 oz (60.4 kg)  05/15/22 140 lb 11.2 oz (63.8 kg)  01/05/22 137 lb 9.6 oz (62.4 kg)    Physical Exam Vitals and nursing note reviewed.  Constitutional:      General: She is not in acute distress.    Appearance: Normal appearance. She is normal weight. She is not ill-appearing, toxic-appearing or diaphoretic.  HENT:     Head: Normocephalic.     Right Ear: External ear normal.     Left Ear: External ear normal.     Nose: Nose normal.     Mouth/Throat:     Mouth: Mucous membranes are moist.     Pharynx: Oropharynx is clear.  Eyes:     General:        Right eye: No discharge.        Left eye: No discharge.     Extraocular Movements: Extraocular movements intact.  Conjunctiva/sclera: Conjunctivae normal.     Pupils: Pupils are equal, round, and reactive to light.  Cardiovascular:     Rate and Rhythm: Normal rate and regular rhythm.     Heart sounds: No murmur heard. Pulmonary:     Effort: Pulmonary effort is normal. No respiratory distress.     Breath sounds: Normal breath sounds. No wheezing or rales.  Musculoskeletal:     Cervical back: Normal range of motion and neck supple.  Skin:    General: Skin is warm and dry.     Capillary Refill: Capillary refill takes less than 2 seconds.  Neurological:     General: No focal deficit present.     Mental Status: She is alert and oriented to person, place, and time. Mental status is at baseline.  Psychiatric:        Mood and Affect: Mood normal.        Behavior: Behavior normal.        Thought Content: Thought content normal.        Judgment: Judgment normal.     Results for orders placed or performed in visit on 01/05/22  Rapid Strep screen([facility])   Specimen: Other   Other  Result Value Ref Range   Strep Gp A Ag, IA W/Reflex Negative Negative  Novel  Coronavirus, NAA ([facility])   Specimen: Nasopharyngeal(NP) swabs in vial transport medium  Result Value Ref Range   SARS-CoV-2, NAA Not Detected Not Detected  Culture, Group A Strep   Other  Result Value Ref Range   Strep A Culture Negative   Veritor Flu A/B Waived  Result Value Ref Range   Influenza A Negative Negative   Influenza B Negative Negative      Assessment & Plan:   Problem List Items Addressed This Visit       Cardiovascular and Mediastinum   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 Ongoing concern.  Will decrease the Topamax back to  BID.  However she did feel like it helped with her migraines.  If anxiety is better controlled and chest tightness improves can consider increasing dose of Topamax again.  Follow up in 1 month.      Relevant Medications   topiramate (TOPAMAX) 50 MG tablet     Other   Anxiety    Chronic. Ongoing.  Will increase Buspar to  BID.  Follow up in 1 month.  Call sooner if concerns arise.       Relevant Medications   busPIRone (BUSPAR) 10 MG tablet     Follow up plan: Return in about 1 month (around 08/02/2022) for Migraines .

## 2022-07-03 NOTE — Telephone Encounter (Signed)
Pt has an appointment with provider this afternoon at 4:00 pm.

## 2022-07-03 NOTE — Assessment & Plan Note (Signed)
Chronic. Ongoing concern.  Will decrease the Topamax back to  BID.  However she did feel like it helped with her migraines.  If anxiety is better controlled and chest tightness improves can consider increasing dose of Topamax again.  Follow up in 1 month.

## 2022-07-03 NOTE — Assessment & Plan Note (Signed)
Chronic. Ongoing.  Will increase Buspar to  BID.  Follow up in 1 month.  Call sooner if concerns arise.

## 2022-07-09 ENCOUNTER — Ambulatory Visit: Payer: Managed Care, Other (non HMO) | Admitting: Nurse Practitioner

## 2022-07-09 ENCOUNTER — Encounter: Payer: Self-pay | Admitting: Nurse Practitioner

## 2022-07-09 VITALS — BP 104/67 | HR 82 | Temp 98.0°F | Wt 133.0 lb

## 2022-07-09 DIAGNOSIS — R300 Dysuria: Secondary | ICD-10-CM

## 2022-07-09 DIAGNOSIS — N760 Acute vaginitis: Secondary | ICD-10-CM

## 2022-07-09 DIAGNOSIS — B9689 Other specified bacterial agents as the cause of diseases classified elsewhere: Secondary | ICD-10-CM

## 2022-07-09 DIAGNOSIS — R8290 Unspecified abnormal findings in urine: Secondary | ICD-10-CM | POA: Diagnosis not present

## 2022-07-09 LAB — URINALYSIS, ROUTINE W REFLEX MICROSCOPIC
Bilirubin, UA: NEGATIVE
Glucose, UA: NEGATIVE
Ketones, UA: NEGATIVE
Nitrite, UA: NEGATIVE
Protein,UA: NEGATIVE
RBC, UA: NEGATIVE
Specific Gravity, UA: 1.02 (ref 1.005–1.030)
Urobilinogen, Ur: 0.2 mg/dL (ref 0.2–1.0)
pH, UA: 5.5 (ref 5.0–7.5)

## 2022-07-09 LAB — MICROSCOPIC EXAMINATION: Bacteria, UA: NONE SEEN

## 2022-07-09 LAB — WET PREP FOR TRICH, YEAST, CLUE
Clue Cell Exam: POSITIVE — AB
Trichomonas Exam: NEGATIVE
Yeast Exam: NEGATIVE

## 2022-07-09 MED ORDER — METRONIDAZOLE 500 MG PO TABS: 500.0000 mg | ORAL_TABLET | Freq: Two times a day (BID) | ORAL | 0 refills | Status: DC

## 2022-07-09 NOTE — Progress Notes (Signed)
BP 104/67 (BP Location: Left Arm, Patient Position: Sitting)   Pulse 82   Temp 98 F (36.7 C)   Wt 133 lb (60.3 kg)   SpO2 99%   BMI 20.63 kg/m    Subjective:    Patient ID: Monica Long, female    DOB: 12/31/1970, 52 y.o.   MRN: [id number]  HPI: Monica Long is a 52 y.o. female  Chief Complaint  Patient presents with   Urinary Tract Infection   URINARY SYMPTOMS Symptoms started yesterday.  Dysuria: yes Urinary frequency: yes Urgency: yes Small volume voids: yes Symptom severity: no Urinary incontinence: no Foul odor: no Hematuria: no Abdominal pain: no Back pain: no Suprapubic pain/pressure: yes Flank pain: no Fever:  no Vomiting: no Relief with cranberry juice: no Relief with pyridium: no Status: better/worse/stable Treatments attempted: increasing fluids   Relevant past medical, surgical, family and social history reviewed and updated as indicated. Interim medical history since our last visit reviewed. Allergies and medications reviewed and updated.  Review of Systems  Constitutional:  Negative for fever.  Gastrointestinal:  Negative for abdominal pain and vomiting.  Genitourinary:  Positive for decreased urine volume, dysuria, frequency and urgency. Negative for flank pain and hematuria.  Musculoskeletal:  Negative for back pain.    Per HPI unless specifically indicated above     Objective:    BP 104/67 (BP Location: Left Arm, Patient Position: Sitting)   Pulse 82   Temp 98 F (36.7 C)   Wt 133 lb (60.3 kg)   SpO2 99%   BMI 20.63 kg/m   Wt Readings from Last 3 Encounters:  07/09/22 133 lb (60.3 kg)  07/03/22 133 lb 3.2 oz (60.4 kg)  05/15/22 140 lb 11.2 oz (63.8 kg)    Physical Exam Vitals and nursing note reviewed.  Constitutional:      General: She is not in acute distress.    Appearance: Normal appearance. She is normal weight. She is not ill-appearing, toxic-appearing or diaphoretic.  HENT:     Head: Normocephalic.      Right Ear: External ear normal.     Left Ear: External ear normal.     Nose: Nose normal.     Mouth/Throat:     Mouth: Mucous membranes are moist.     Pharynx: Oropharynx is clear.  Eyes:     General:        Right eye: No discharge.        Left eye: No discharge.     Extraocular Movements: Extraocular movements intact.     Conjunctiva/sclera: Conjunctivae normal.     Pupils: Pupils are equal, round, and reactive to light.  Cardiovascular:     Rate and Rhythm: Normal rate and regular rhythm.     Heart sounds: No murmur heard. Pulmonary:     Effort: Pulmonary effort is normal. No respiratory distress.     Breath sounds: Normal breath sounds. No wheezing or rales.  Abdominal:     General: Abdomen is flat. Bowel sounds are normal. There is no distension.     Palpations: Abdomen is soft.     Tenderness: There is no abdominal tenderness. There is no right CVA tenderness, left CVA tenderness or guarding.  Musculoskeletal:     Cervical back: Normal range of motion and neck supple.  Skin:    General: Skin is warm and dry.     Capillary Refill: Capillary refill takes less than 2 seconds.  Neurological:     General: No focal  deficit present.     Mental Status: She is alert and oriented to person, place, and time. Mental status is at baseline.  Psychiatric:        Mood and Affect: Mood normal.        Behavior: Behavior normal.        Thought Content: Thought content normal.        Judgment: Judgment normal.     Results for orders placed or performed in visit on 01/05/22  Rapid Strep screen([facility])   Specimen: Other   Other  Result Value Ref Range   Strep Gp A Ag, IA W/Reflex Negative Negative  Novel Coronavirus, NAA ([facility])   Specimen: Nasopharyngeal(NP) swabs in vial transport medium  Result Value Ref Range   SARS-CoV-2, NAA Not Detected Not Detected  Culture, Group A Strep   Other  Result Value Ref Range   Strep A Culture Negative   Veritor Flu A/B Waived   Result Value Ref Range   Influenza A Negative Negative   Influenza B Negative Negative      Assessment & Plan:   Problem List Items Addressed This Visit   None Visit Diagnoses     Bacterial vaginosis    -  Primary   Clue cells seen on vaginal swab. Will treat with Flagyl twice daily x 7 days.  Will send for culture due to +1 leuks. Follow up if not improved.   Relevant Medications   metroNIDAZOLE (FLAGYL) 500 MG tablet   Dysuria       Relevant Orders   Urinalysis, Routine w reflex microscopic   WET PREP FOR TRICH, YEAST, CLUE   Abnormal urinalysis       Relevant Orders   Urine Culture        Follow up plan: Return if symptoms worsen or fail to improve.

## 2022-07-10 ENCOUNTER — Encounter: Payer: Self-pay | Admitting: Nurse Practitioner

## 2022-07-10 NOTE — Progress Notes (Signed)
Results discussed with patient during visit.

## 2022-07-11 MED ORDER — NITROFURANTOIN MONOHYD MACRO 100 MG PO CAPS: 100.0000 mg | ORAL_CAPSULE | Freq: Two times a day (BID) | ORAL | 0 refills | Status: DC

## 2022-07-12 LAB — URINE CULTURE

## 2022-07-12 MED ORDER — CIPROFLOXACIN HCL 500 MG PO TABS: 500.0000 mg | ORAL_TABLET | Freq: Two times a day (BID) | ORAL | 0 refills | Status: AC

## 2022-07-12 NOTE — Addendum Note (Signed)
Addended by: Larae Grooms on: 07/12/2022 02:54 PM   Modules accepted: Orders

## 2022-07-12 NOTE — Progress Notes (Signed)
Hi Monica Long. The final report on your urine came back showing that the macrobid I sent in for you will not kill the bacteria that is growing.  I sent in another antibiotic called ciprofloxacin to the pharmacy. Stop the macrobid and start this medication.

## 2022-08-09 ENCOUNTER — Encounter: Payer: Self-pay | Admitting: Nurse Practitioner

## 2022-08-09 ENCOUNTER — Ambulatory Visit: Payer: Managed Care, Other (non HMO) | Admitting: Nurse Practitioner

## 2022-08-09 VITALS — BP 115/76 | HR 66 | Temp 97.9°F | Wt 129.4 lb

## 2022-08-09 DIAGNOSIS — F419 Anxiety disorder, unspecified: Secondary | ICD-10-CM

## 2022-08-09 DIAGNOSIS — G43719 Chronic migraine without aura, intractable, without status migrainosus: Secondary | ICD-10-CM

## 2022-08-09 NOTE — Assessment & Plan Note (Signed)
Chronic.  Controlled.  Continue with current medication regimen of Wellbutrin and Effexor.  Return to clinic in 5 months for reevaluation.  Call sooner if concerns arise.

## 2022-08-09 NOTE — Assessment & Plan Note (Signed)
Chronic.  Better controlled with Topamax and PRN fioricet.  Continue with current medication regimen.  Follow up in 5 months.  Call sooner if concerns arise.

## 2022-08-09 NOTE — Progress Notes (Signed)
BP 115/76   Pulse 66   Temp 97.9 F (36.6 C) (Oral)   Wt 129 lb 6.4 oz (58.7 kg)   LMP 06/05/2022 (Approximate)   SpO2 99%   BMI 20.07 kg/m    Subjective:    Patient ID: Monica Long, female    DOB: 07-21-1970, 52 y.o.   MRN: [id number]  HPI: Monica Long is a 52 y.o. female  Chief Complaint  Patient presents with   Migraine   MIGRAINES Patient stateis doing well with the topamax at 50mg  BID.  Using Fioricet PRN for break through migraines.   Duration: chronic Onset: sudden Severity: 7/10   ANXIETY Patient states she is doing a lot better with the Buspar 10mg  BID.  She is also doing the wellbutrin 300mg  daily.  Denies concerns at visit today.  Feels like she is doing.    Flowsheet Row Office Visit from 08/09/2022 in [facility]  PHQ-9 Total Score 0         08/09/2022    3:04 PM 07/03/2022    4:03 PM 05/15/2022    1:44 PM 01/05/2022    4:11 PM  GAD 7 : Generalized Anxiety Score  Nervous, Anxious, on Edge 0 0 0 0  Control/stop worrying 0 0 0 0  Worry too much - different things 0 0 0 0  Trouble relaxing 0 0 0 0  Restless 0 0 0 0  Easily annoyed or irritable  0 0 0  Afraid - awful might happen 0 0 0 0  Total GAD 7 Score  0 0 0  Anxiety Difficulty  Not difficult at all Not difficult at all Not difficult at all       Relevant past medical, surgical, family and social history reviewed and updated as indicated. Interim medical history since our last visit reviewed. Allergies and medications reviewed and updated.  Review of Systems  Neurological:  Positive for headaches.  Psychiatric/Behavioral:  Negative for dysphoric mood and suicidal ideas. The patient is nervous/anxious.     Per HPI unless specifically indicated above     Objective:    BP 115/76   Pulse 66   Temp 97.9 F (36.6 C) (Oral)   Wt 129 lb 6.4 oz (58.7 kg)   LMP 06/05/2022 (Approximate)   SpO2 99%   BMI 20.07 kg/m   Wt Readings from Last 3 Encounters:   08/09/22 129 lb 6.4 oz (58.7 kg)  07/09/22 133 lb (60.3 kg)  07/03/22 133 lb 3.2 oz (60.4 kg)    Physical Exam Vitals and nursing note reviewed.  Constitutional:      General: She is not in acute distress.    Appearance: Normal appearance. She is normal weight. She is not ill-appearing, toxic-appearing or diaphoretic.  HENT:     Head: Normocephalic.     Right Ear: External ear normal.     Left Ear: External ear normal.     Nose: Nose normal.     Mouth/Throat:     Mouth: Mucous membranes are moist.     Pharynx: Oropharynx is clear.  Eyes:     General:        Right eye: No discharge.        Left eye: No discharge.     Extraocular Movements: Extraocular movements intact.     Conjunctiva/sclera: Conjunctivae normal.     Pupils: Pupils are equal, round, and reactive to light.  Cardiovascular:     Rate and Rhythm: Normal rate and  regular rhythm.     Heart sounds: No murmur heard. Pulmonary:     Effort: Pulmonary effort is normal. No respiratory distress.     Breath sounds: Normal breath sounds. No wheezing or rales.  Musculoskeletal:     Cervical back: Normal range of motion and neck supple.  Skin:    General: Skin is warm and dry.     Capillary Refill: Capillary refill takes less than 2 seconds.  Neurological:     General: No focal deficit present.     Mental Status: She is alert and oriented to person, place, and time. Mental status is at baseline.  Psychiatric:        Mood and Affect: Mood normal.        Behavior: Behavior normal.        Thought Content: Thought content normal.        Judgment: Judgment normal.     Results for orders placed or performed in visit on 07/09/22  WET PREP FOR TRICH, YEAST, CLUE   Specimen: Urine   Urine  Result Value Ref Range   Trichomonas Exam Negative Negative   Yeast Exam Negative Negative   Clue Cell Exam Positive (A) Negative  Urine Culture   Specimen: Urine   UR  Result Value Ref Range   Urine Culture, Routine Final report  (A)    Organism ID, Bacteria Proteus mirabilis (A)    Antimicrobial Susceptibility Comment   Microscopic Examination   Urine  Result Value Ref Range   WBC, UA 6-10 (A) 0 - 5 /hpf   RBC, Urine 0-2 0 - 2 /hpf   Epithelial Cells (non renal) 0-10 0 - 10 /hpf   Bacteria, UA None seen None seen/Few  Urinalysis, Routine w reflex microscopic  Result Value Ref Range   Specific Gravity, UA 1.020 1.005 - 1.030   pH, UA 5.5 5.0 - 7.5   Color, UA Yellow Yellow   Appearance Ur Clear Clear   Leukocytes,UA 1+ (A) Negative   Protein,UA Negative Negative/Trace   Glucose, UA Negative Negative   Ketones, UA Negative Negative   RBC, UA Negative Negative   Bilirubin, UA Negative Negative   Urobilinogen, Ur 0.2 0.2 - 1.0 mg/dL   Nitrite, UA Negative Negative   Microscopic Examination See below:       Assessment & Plan:   Problem List Items Addressed This Visit       Cardiovascular and Mediastinum   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  Better controlled with Topamax and PRN fioricet.  Continue with current medication regimen.  Follow up in 5 months.  Call sooner if concerns arise.         Other   Anxiety    Chronic.  Controlled.  Continue with current medication regimen of Wellbutrin and Effexor.  Return to clinic in 5 months for reevaluation.  Call sooner if concerns arise.          Follow up plan: Return in about 5 months (around 01/09/2023) for HTN, HLD, DM2 FU.

## 2022-08-13 ENCOUNTER — Encounter: Payer: Self-pay | Admitting: Nurse Practitioner

## 2022-08-13 ENCOUNTER — Ambulatory Visit: Admit: 2022-08-13 | Payer: Managed Care, Other (non HMO)

## 2022-08-14 ENCOUNTER — Ambulatory Visit: Payer: Managed Care, Other (non HMO)

## 2022-08-14 ENCOUNTER — Ambulatory Visit: Payer: Managed Care, Other (non HMO) | Admitting: Nurse Practitioner

## 2022-08-14 ENCOUNTER — Encounter: Payer: Self-pay | Admitting: Nurse Practitioner

## 2022-08-14 VITALS — BP 107/73 | HR 73 | Temp 97.9°F | Wt 126.6 lb

## 2022-08-14 DIAGNOSIS — M21371 Foot drop, right foot: Secondary | ICD-10-CM

## 2022-08-14 NOTE — Progress Notes (Signed)
BP 107/73   Pulse 73   Temp 97.9 F (36.6 C) (Oral)   Wt 126 lb 9.6 oz (57.4 kg)   LMP 06/05/2022 (Approximate)   SpO2 98%   BMI 19.64 kg/m    Subjective:    Patient ID: Monica Long, female    DOB: 07-Jul-1970, 52 y.o.   MRN: [id number]  HPI: [name] is a 52 y.o. female  Chief Complaint  Patient presents with   Numbness    Pt states started Saturday while being in the emergency room with her dad   FOOT DROP Patient was waiting in the ER with her father over the weekend.  She was sitting on her foot and when she went to stand her foot was numb and she was having difficulty walking.  Patient states she was seen in [facility] and told she has foot drop and would need physical therapy.    Relevant past medical, surgical, family and social history reviewed and updated as indicated. Interim medical history since our last visit reviewed. Allergies and medications reviewed and updated.  Review of Systems  Musculoskeletal:        Leg numbness and difficulty lifting leg.    Per HPI unless specifically indicated above     Objective:    BP 107/73   Pulse 73   Temp 97.9 F (36.6 C) (Oral)   Wt 126 lb 9.6 oz (57.4 kg)   LMP 06/05/2022 (Approximate)   SpO2 98%   BMI 19.64 kg/m   Wt Readings from Last 3 Encounters:  08/14/22 126 lb 9.6 oz (57.4 kg)  08/09/22 129 lb 6.4 oz (58.7 kg)  07/09/22 133 lb (60.3 kg)    Physical Exam Vitals and nursing note reviewed.  Constitutional:      General: She is not in acute distress.    Appearance: Normal appearance. She is normal weight. She is not ill-appearing, toxic-appearing or diaphoretic.  HENT:     Head: Normocephalic.     Right Ear: External ear normal.     Left Ear: External ear normal.     Nose: Nose normal.     Mouth/Throat:     Mouth: Mucous membranes are moist.     Pharynx: Oropharynx is clear.  Eyes:     General:        Right eye: No discharge.        Left eye: No discharge.     Extraocular Movements:  Extraocular movements intact.     Conjunctiva/sclera: Conjunctivae normal.     Pupils: Pupils are equal, round, and reactive to light.  Cardiovascular:     Rate and Rhythm: Normal rate and regular rhythm.     Heart sounds: No murmur heard. Pulmonary:     Effort: Pulmonary effort is normal. No respiratory distress.     Breath sounds: Normal breath sounds. No wheezing or rales.  Musculoskeletal:     Cervical back: Normal range of motion and neck supple.  Skin:    General: Skin is warm and dry.     Capillary Refill: Capillary refill takes less than 2 seconds.  Neurological:     General: No focal deficit present.     Mental Status: She is alert and oriented to person, place, and time. Mental status is at baseline.  Psychiatric:        Mood and Affect: Mood normal.        Behavior: Behavior normal.        Thought Content: Thought content normal.  Judgment: Judgment normal.     Results for orders placed or performed in visit on 07/09/22  WET PREP FOR TRICH, YEAST, CLUE   Specimen: Urine   Urine  Result Value Ref Range   Trichomonas Exam Negative Negative   Yeast Exam Negative Negative   Clue Cell Exam Positive (A) Negative  Urine Culture   Specimen: Urine   UR  Result Value Ref Range   Urine Culture, Routine Final report (A)    Organism ID, Bacteria Proteus mirabilis (A)    Antimicrobial Susceptibility Comment   Microscopic Examination   Urine  Result Value Ref Range   WBC, UA 6-10 (A) 0 - 5 /hpf   RBC, Urine 0-2 0 - 2 /hpf   Epithelial Cells (non renal) 0-10 0 - 10 /hpf   Bacteria, UA None seen None seen/Few  Urinalysis, Routine w reflex microscopic  Result Value Ref Range   Specific Gravity, UA 1.020 1.005 - 1.030   pH, UA 5.5 5.0 - 7.5   Color, UA Yellow Yellow   Appearance Ur Clear Clear   Leukocytes,UA 1+ (A) Negative   Protein,UA Negative Negative/Trace   Glucose, UA Negative Negative   Ketones, UA Negative Negative   RBC, UA Negative Negative    Bilirubin, UA Negative Negative   Urobilinogen, Ur 0.2 0.2 - 1.0 mg/dL   Nitrite, UA Negative Negative   Microscopic Examination See below:       Assessment & Plan:   Problem List Items Addressed This Visit   None Visit Diagnoses     Right foot drop    -  Primary   New referral placed for physical therapy.  Letter written for work. If not improved after physical therapy will order MRI.   Relevant Orders   Ambulatory referral to Physical Therapy        Follow up plan: Return if symptoms worsen or fail to improve.

## 2022-08-15 ENCOUNTER — Encounter: Payer: Self-pay | Admitting: Nurse Practitioner

## 2022-08-15 DIAGNOSIS — M21371 Foot drop, right foot: Secondary | ICD-10-CM

## 2022-08-16 ENCOUNTER — Encounter: Payer: Self-pay | Admitting: Nurse Practitioner

## 2022-08-21 ENCOUNTER — Ambulatory Visit: Payer: Managed Care, Other (non HMO)

## 2022-08-27 ENCOUNTER — Ambulatory Visit: Payer: Self-pay | Admitting: *Deleted

## 2022-08-27 ENCOUNTER — Encounter: Payer: Self-pay | Admitting: Nurse Practitioner

## 2022-08-27 ENCOUNTER — Ambulatory Visit
Admission: RE | Admit: 2022-08-27 | Discharge: 2022-08-27 | Disposition: A | Discharge status: Home / Self Care | Payer: Managed Care, Other (non HMO) | Source: Ambulatory Visit | Attending: Nurse Practitioner | Admitting: Nurse Practitioner

## 2022-08-27 DIAGNOSIS — M21371 Foot drop, right foot: Secondary | ICD-10-CM

## 2022-08-27 NOTE — Telephone Encounter (Signed)
Reason for Disposition  [1] MILD swelling of both ankles (i.e., pedal edema) AND [2] new-onset or worsening  Answer Assessment - Initial Assessment Questions 1. ONSET: "When did the swelling start?" (e.g., minutes, hours, days)     Thursday started swelling and are red splotchy.   They are a little tight.   I went to a concert at the beach.    They started swelling.    2. LOCATION: "What part of the leg is swollen?"  "Are both legs swollen or just one leg?"     Both legs at the ankles and foot 3. SEVERITY: "How bad is the swelling?" (e.g., localized; mild, moderate, severe)   - Localized: Small area of swelling localized to one leg.   - MILD pedal edema: Swelling limited to foot and ankle, pitting edema < 1/4 inch (6 mm) deep, rest and elevation eliminate most or all swelling.   - MODERATE edema: Swelling of lower leg to knee, pitting edema > 1/4 inch (6 mm) deep, rest and elevation only partially reduce swelling.   - SEVERE edema: Swelling extends above knee, facial or hand swelling present.      Moderate 4. REDNESS: "Does the swelling look red or infected?"     Splotchy  5. PAIN: "Is the swelling painful to touch?" If Yes, ask: "How painful is it?"   (Scale 1-10; mild, moderate or severe)     No pain   Itch a little 6. FEVER: "Do you have a fever?" If Yes, ask: "What is it, how was it measured, and when did it start?"      Not asked 7. CAUSE: "What do you think is causing the leg swelling?"     I don't know 8. MEDICAL HISTORY: "Do you have a history of blood clots (e.g., DVT), cancer, heart failure, kidney disease, or liver failure?"     No 9. RECURRENT SYMPTOM: "Have you had leg swelling before?" If Yes, ask: "When was the last time?" "What happened that time?"     No 10. OTHER SYMPTOMS: "Do you have any other symptoms?" (e.g., chest pain, difficulty breathing)       I've been dealing with foot drop.   I had an MRI for that this morning. 11. PREGNANCY: "Is there any chance you are  pregnant?" "When was your last menstrual period?"       Not asked  Protocols used: Leg Swelling and Edema-A-AH

## 2022-08-27 NOTE — Telephone Encounter (Signed)
Attempted to reach patient, LVM to call office back to schedule her appointment with another provider.

## 2022-08-27 NOTE — Telephone Encounter (Signed)
  Chief Complaint: Both ankles and feet are swollen and splotchy red. Symptoms: above Frequency: Since Thursday when she attended an outdoor concert at the beach. Pertinent Negatives: Patient denies pain.  Very mild itching.   Never had a problem with swollen legs before Disposition: [] ED /[] Urgent Care (no appt availability in office) / [x] Appointment(In office/virtual)/ []  [facility]/ [] Home Care/ [] Refused Recommended Disposition /[] [facility] [place] [facility]/ []  Follow-up with PCP Additional Notes: Appt. Made with Prescott Gum, NP for 08/28/2022 at 2:40.

## 2022-08-28 ENCOUNTER — Ambulatory Visit: Payer: Managed Care, Other (non HMO) | Admitting: Family Medicine

## 2022-08-29 ENCOUNTER — Encounter: Payer: Self-pay | Admitting: Nurse Practitioner

## 2022-08-30 ENCOUNTER — Encounter: Payer: Self-pay | Admitting: Nurse Practitioner

## 2022-09-03 ENCOUNTER — Other Ambulatory Visit: Payer: Managed Care, Other (non HMO)

## 2022-09-03 NOTE — Telephone Encounter (Signed)
Please see result note with recommendations

## 2022-09-03 NOTE — Progress Notes (Signed)
Please let patient know that her Lumbar MRI shows that she has a disc bulge at L4-L5.  I recommend working with physical therapy and seeing the vascular specialist for further evaluation and treatment of symptoms.

## 2022-09-05 ENCOUNTER — Encounter: Payer: Self-pay | Admitting: Nurse Practitioner

## 2022-09-06 NOTE — Telephone Encounter (Signed)
Responded to by other [employer].

## 2022-09-10 ENCOUNTER — Other Ambulatory Visit (INDEPENDENT_AMBULATORY_CARE_PROVIDER_SITE_OTHER): Payer: Self-pay | Admitting: Nurse Practitioner

## 2022-09-10 ENCOUNTER — Ambulatory Visit (INDEPENDENT_AMBULATORY_CARE_PROVIDER_SITE_OTHER): Payer: Managed Care, Other (non HMO)

## 2022-09-10 ENCOUNTER — Ambulatory Visit (INDEPENDENT_AMBULATORY_CARE_PROVIDER_SITE_OTHER): Payer: Managed Care, Other (non HMO) | Admitting: Nurse Practitioner

## 2022-09-10 ENCOUNTER — Encounter (INDEPENDENT_AMBULATORY_CARE_PROVIDER_SITE_OTHER): Payer: Self-pay | Admitting: Nurse Practitioner

## 2022-09-10 VITALS — BP 114/76 | HR 90 | Resp 17 | Ht 68.0 in | Wt 127.2 lb

## 2022-09-10 DIAGNOSIS — M21371 Foot drop, right foot: Secondary | ICD-10-CM

## 2022-09-10 DIAGNOSIS — M21379 Foot drop, unspecified foot: Secondary | ICD-10-CM

## 2022-09-10 DIAGNOSIS — M79669 Pain in unspecified lower leg: Secondary | ICD-10-CM

## 2022-09-10 DIAGNOSIS — I83813 Varicose veins of bilateral lower extremities with pain: Secondary | ICD-10-CM

## 2022-09-10 NOTE — Progress Notes (Unsigned)
Subjective:    Patient ID: Monica Long, female    DOB: 01-03-1971, 52 y.o.   MRN: [id number] Chief Complaint  Patient presents with   Establish Care    Monica Long is a 52 year old female who presents today as a referral from Monica Grooms, NP in regards to right foot drop.  The foot drop occurred after sitting in the emergency room for several hours with her father.  She notes that when she got up to go she had the feeling as if her leg fell asleep.  It was numb from below her knee down to her foot.  Since that time the patient has been working with physical therapy and it has improved and now the numbness is more so in her ankle to her foot area.  She denies any claudication-like symptoms.  She denies any pain.  Overall she is progressing well with treatment.  There is concern the patient may have an arterial component as there was previously some notation of possible claudication-like symptoms however the patient denies those today.  Today patient has ABI of 1.10 on the right and 1.13 on the left.  She has normal TBI's bilaterally.  Strong triphasic toe waveforms bilaterally with normal toe waveforms bilaterally.    Review of Systems  Neurological:  Positive for numbness.  All other systems reviewed and are negative.      Objective:   Physical Exam Vitals reviewed.  HENT:     Head: Normocephalic.  Cardiovascular:     Rate and Rhythm: Normal rate.  Pulmonary:     Effort: Pulmonary effort is normal.  Skin:    General: Skin is warm and dry.  Neurological:     Mental Status: She is alert and oriented to person, place, and time.  Psychiatric:        Mood and Affect: Mood normal.        Behavior: Behavior normal.        Thought Content: Thought content normal.        Judgment: Judgment normal.     BP 114/76 (BP Location: Left Arm)   Pulse 90   Resp 17   Ht 5\' 8"  (1.727 m)   Wt 127 lb 3.2 oz (57.7 kg)   BMI 19.34 kg/m   Past Medical History:  Diagnosis Date    Anxiety    panic attack   Depression 02/08/2014   Hepatic steatosis    History of mammogram 01/07/2014; 05-09-15   birad 2; neg   History of Papanicolaou smear of cervix 01/07/2014   -/-   Hypercholesteremia    Hypertriglyceridemia    Kidney stone 02/2015   Peripheral vascular disease (HCC)     Social History   Socioeconomic History   Marital status: Single    Spouse name: Not on file   Number of children: 1   Years of education: 12   Highest education level: Not on file  Occupational History    Employer: [employer]  Tobacco Use   Smoking status: Never   Smokeless tobacco: Never  Vaping Use   Vaping Use: Never used  Substance and Sexual Activity   Alcohol use: No    Alcohol/week: 0.0 standard drinks of alcohol   Drug use: No   Sexual activity: Yes    Birth control/protection: Pill  Other Topics Concern   Not on file  Social History Narrative   Not on file   Social Determinants of Health   Financial Resource Strain: Not on file  Food Insecurity: Not on file  Transportation Needs: Not on file  Physical Activity: Not on file  Stress: Not on file  Social Connections: Not on file  Intimate Partner Violence: Not on file    Past Surgical History:  Procedure Laterality Date   CESAREAN SECTION  05/08/2001   x1   COLONOSCOPY WITH PROPOFOL N/A 02/24/2021   Procedure: COLONOSCOPY WITH PROPOFOL;  Surgeon: Midge Minium, MD;  Location[facility];  Service: Endoscopy;  Laterality: N/A;    Family History  Problem Relation Age of Onset   Healthy Mother    Hypertension Father    Colon cancer Maternal Grandmother 8    Allergies  Allergen Reactions   Amoxicillin     Severe vomiting        Latest Ref Rng & Units 08/10/2021    8:49 AM 06/08/2021    2:36 PM 02/27/2021    3:02 PM  CBC  WBC 3.4 - 10.8 x10E3/uL 7.8  10.8  8.1   Hemoglobin 11.1 - 15.9 g/dL 25.3  66.4  40.3   Hematocrit 34.0 - 46.6 % 38.2  38.6  40.6   Platelets 150 - 450 x10E3/uL 273  312   275       CMP     Component Value Date/Time   NA 139 08/10/2021 0849   K 4.5 08/10/2021 0849   CL 103 08/10/2021 0849   CO2 22 08/10/2021 0849   GLUCOSE 94 08/10/2021 0849   GLUCOSE 105 (H) 02/21/2015 2102   BUN 17 08/10/2021 0849   CREATININE 0.88 08/10/2021 0849   CALCIUM 9.2 08/10/2021 0849   PROT 7.1 08/10/2021 0849   ALBUMIN 4.3 08/10/2021 0849   AST 11 08/10/2021 0849   ALT 8 08/10/2021 0849   ALKPHOS 66 08/10/2021 0849   BILITOT <0.2 08/10/2021 0849   EGFR 80 08/10/2021 0849   GFRNONAA 93 07/03/2019 1502     No results found.     Assessment & Plan:   1. Right foot drop The patient is progressing fairly well with her foot drop.  It is improving without significant issue she does not have significant pain.  Her noninvasive studies did not reveal significant peripheral arterial disease.  Patient will follow-up with [place] on an as-needed basis.  2. Varicose veins of leg with pain, bilateral Patient was previously seen by [place] in 2018 for varicose veins.  Treated by [facility].  She denies any further issues.   Current Outpatient Medications on File Prior to Visit  Medication Sig Dispense Refill   azelastine (ASTELIN) 0.1 % nasal spray PLACE 1 SPRAY INTO BOTH NOSTRILS 2 (TWO) TIMES DAILY. USE IN EACH NOSTRIL AS DIRECTED 30 mL 6   buPROPion (WELLBUTRIN XL) 300 MG 24 hr tablet Take 1 tablet (300 mg total) by mouth daily. 90 tablet 1   busPIRone (BUSPAR) 10 MG tablet Take 1 tablet (10 mg total) by mouth 2 (two) times daily. 180 tablet 1   butalbital-acetaminophen-caffeine (FIORICET) 50-325-40 MG tablet Take 1 tablet by mouth every 6 (six) hours as needed for headache. 14 tablet 0   drospirenone-ethinyl estradiol (NIKKI) 3-0.02 MG tablet Take 1 tablet by mouth daily. 84 tablet 3   fluticasone (FLONASE) 50 MCG/ACT nasal spray SPRAY 2 SPRAYS INTO EACH NOSTRIL EVERY DAY 48 mL 1   Omega-3 1000 MG CAPS Take 1,000 mg by mouth daily.     topiramate (TOPAMAX) 50 MG tablet Take 1  tablet (50 mg total) by mouth daily. 180 tablet 1   [  DISCONTINUED] Azelastine-Fluticasone 137-50 MCG/ACT SUSP Place 1 spray into the nose 2 (two) times daily. 23 g 3   [DISCONTINUED] hydrochlorothiazide (MICROZIDE) 12.5 MG capsule Take 1 capsule (12.5 mg total) by mouth daily. 90 capsule 1   [DISCONTINUED] sertraline (ZOLOFT) 50 MG tablet TAKE 1 TABLET BY MOUTH EVERY DAY 60 tablet 1   No current facility-administered medications on file prior to visit.    There are no Patient Instructions on file for this visit. No follow-ups on file.   Georgiana Spinner, NP

## 2022-09-11 ENCOUNTER — Encounter: Payer: Self-pay | Admitting: Nurse Practitioner

## 2022-09-11 LAB — VAS US ABI WITH/WO TBI
Left ABI: 1.13
Right ABI: 1.1

## 2022-09-12 ENCOUNTER — Encounter: Payer: Self-pay | Admitting: Nurse Practitioner

## 2022-09-12 ENCOUNTER — Telehealth: Payer: Managed Care, Other (non HMO) | Admitting: Nurse Practitioner

## 2022-09-12 DIAGNOSIS — F419 Anxiety disorder, unspecified: Secondary | ICD-10-CM | POA: Diagnosis not present

## 2022-09-12 MED ORDER — BUSPIRONE HCL 15 MG PO TABS: 15.0000 mg | ORAL_TABLET | Freq: Three times a day (TID) | ORAL | 1 refills | Status: DC

## 2022-09-12 NOTE — Assessment & Plan Note (Signed)
Chronic. Not well controlled due to recent diagnosis of foot drop.  Will increase dose of Buspar to 15mg  TID.  Okay to take it BID if anxiety is better controlled.  Continue with Wellbutrin.  Follow up if symptoms not improved.

## 2022-09-12 NOTE — Progress Notes (Signed)
There were no vitals taken for this visit.   Subjective:    Patient ID: Monica Long, female    DOB: 03-23-1970, 52 y.o.   MRN: [id number]  HPI: Monica Long is a 52 y.o. female  Chief Complaint  Patient presents with   Anxiety   ANXIETY Patient states she is having a lot more anxiety due to her recent issue with foot drop.  She is currently taking the Wellbutrin daily and Buspar BID.  She does feel like the buspar was helping with her anxiety prior to the foot drop issue that started 1 month ago.  Denies SI.   Flowsheet Row Video Visit from 09/12/2022 in [facility]  PHQ-9 Total Score 6         09/12/2022    2:30 PM 08/14/2022   11:19 AM 08/09/2022    3:04 PM 07/03/2022    4:03 PM  GAD 7 : Generalized Anxiety Score  Nervous, Anxious, on Edge 2 0 0 0  Control/stop worrying 1 0 0 0  Worry too much - different things 2 0 0 0  Trouble relaxing 2 0 0 0  Restless 0 0 0 0  Easily annoyed or irritable 1 0  0  Afraid - awful might happen 1 0 0 0  Total GAD 7 Score 9 0  0  Anxiety Difficulty Extremely difficult Not difficult at all  Not difficult at all      Relevant past medical, surgical, family and social history reviewed and updated as indicated. Interim medical history since our last visit reviewed. Allergies and medications reviewed and updated.  Review of Systems  Psychiatric/Behavioral:  Negative for suicidal ideas. The patient is nervous/anxious.     Per HPI unless specifically indicated above     Objective:    There were no vitals taken for this visit.  Wt Readings from Last 3 Encounters:  09/10/22 127 lb 3.2 oz (57.7 kg)  08/14/22 126 lb 9.6 oz (57.4 kg)  08/09/22 129 lb 6.4 oz (58.7 kg)    Physical Exam Vitals and nursing note reviewed.  HENT:     Head: Normocephalic.     Right Ear: Hearing normal.     Left Ear: Hearing normal.     Nose: Nose normal.  Eyes:     Pupils: Pupils are equal, round, and reactive to light.   Pulmonary:     Effort: Pulmonary effort is normal. No respiratory distress.  Neurological:     Mental Status: She is alert.  Psychiatric:        Mood and Affect: Mood normal.        Behavior: Behavior normal.        Thought Content: Thought content normal.        Judgment: Judgment normal.     Results for orders placed or performed in visit on 09/10/22  VAS [place] ABI WITH/WO TBI  Result Value Ref Range   Right ABI 1.10    Left ABI 1.13       Assessment & Plan:   Problem List Items Addressed This Visit       Other   Anxiety - Primary    Chronic. Not well controlled due to recent diagnosis of foot drop.  Will increase dose of Buspar to 15mg  TID.  Okay to take it BID if anxiety is better controlled.  Continue with Wellbutrin.  Follow up if symptoms not improved.      Relevant Medications   busPIRone (  BUSPAR) 15 MG tablet     Follow up plan: Return if symptoms worsen or fail to improve.   This visit was completed via MyChart due to the restrictions of the COVID-19 pandemic. All issues as above were discussed and addressed. Physical exam was done as above through visual confirmation on MyChart. If it was felt that the patient should be evaluated in the office, they were directed there. The patient verbally consented to this visit. Location of the patient: Home Location of the provider: Office Those involved with this call:  Provider: Larae Grooms, NP CMA: Wilhemena Durie, CMA Front Desk/Registration: Servando Snare This encounter was conducted via video.  I spent 30 dedicated to the care of this patient on the date of this encounter to include previsit review of symptoms, plan of care and follow up, face to face time with the patient, and post visit ordering of testing.

## 2022-09-16 ENCOUNTER — Encounter: Payer: Self-pay | Admitting: Nurse Practitioner

## 2022-09-17 ENCOUNTER — Telehealth: Payer: Self-pay | Admitting: Nurse Practitioner

## 2022-09-17 NOTE — Telephone Encounter (Signed)
Patient dropped off FMLA Paperwork to be completed by the provider patient also stated that this FMLA is only for appointments. Paperwork was placed in the providers folder for completion.

## 2022-09-18 NOTE — Telephone Encounter (Signed)
Paperwork was completed and faxed in for the patient. Patient notified that this was completed for her via a mychart message. Copy placed up front for pick up and copy placed in bin to be scanned.

## 2022-10-23 IMAGING — CR DG CHEST 2V
2 series · 2 of 2 positions shown · non-contrast
Comparison: Prior radiograph from 01/02/2017.

CLINICAL DATA: Initial evaluation for acute fever and cough status
post surgical procedure, evaluate for aspiration.

EXAM:
CHEST - 2 VIEW

[chest pa]
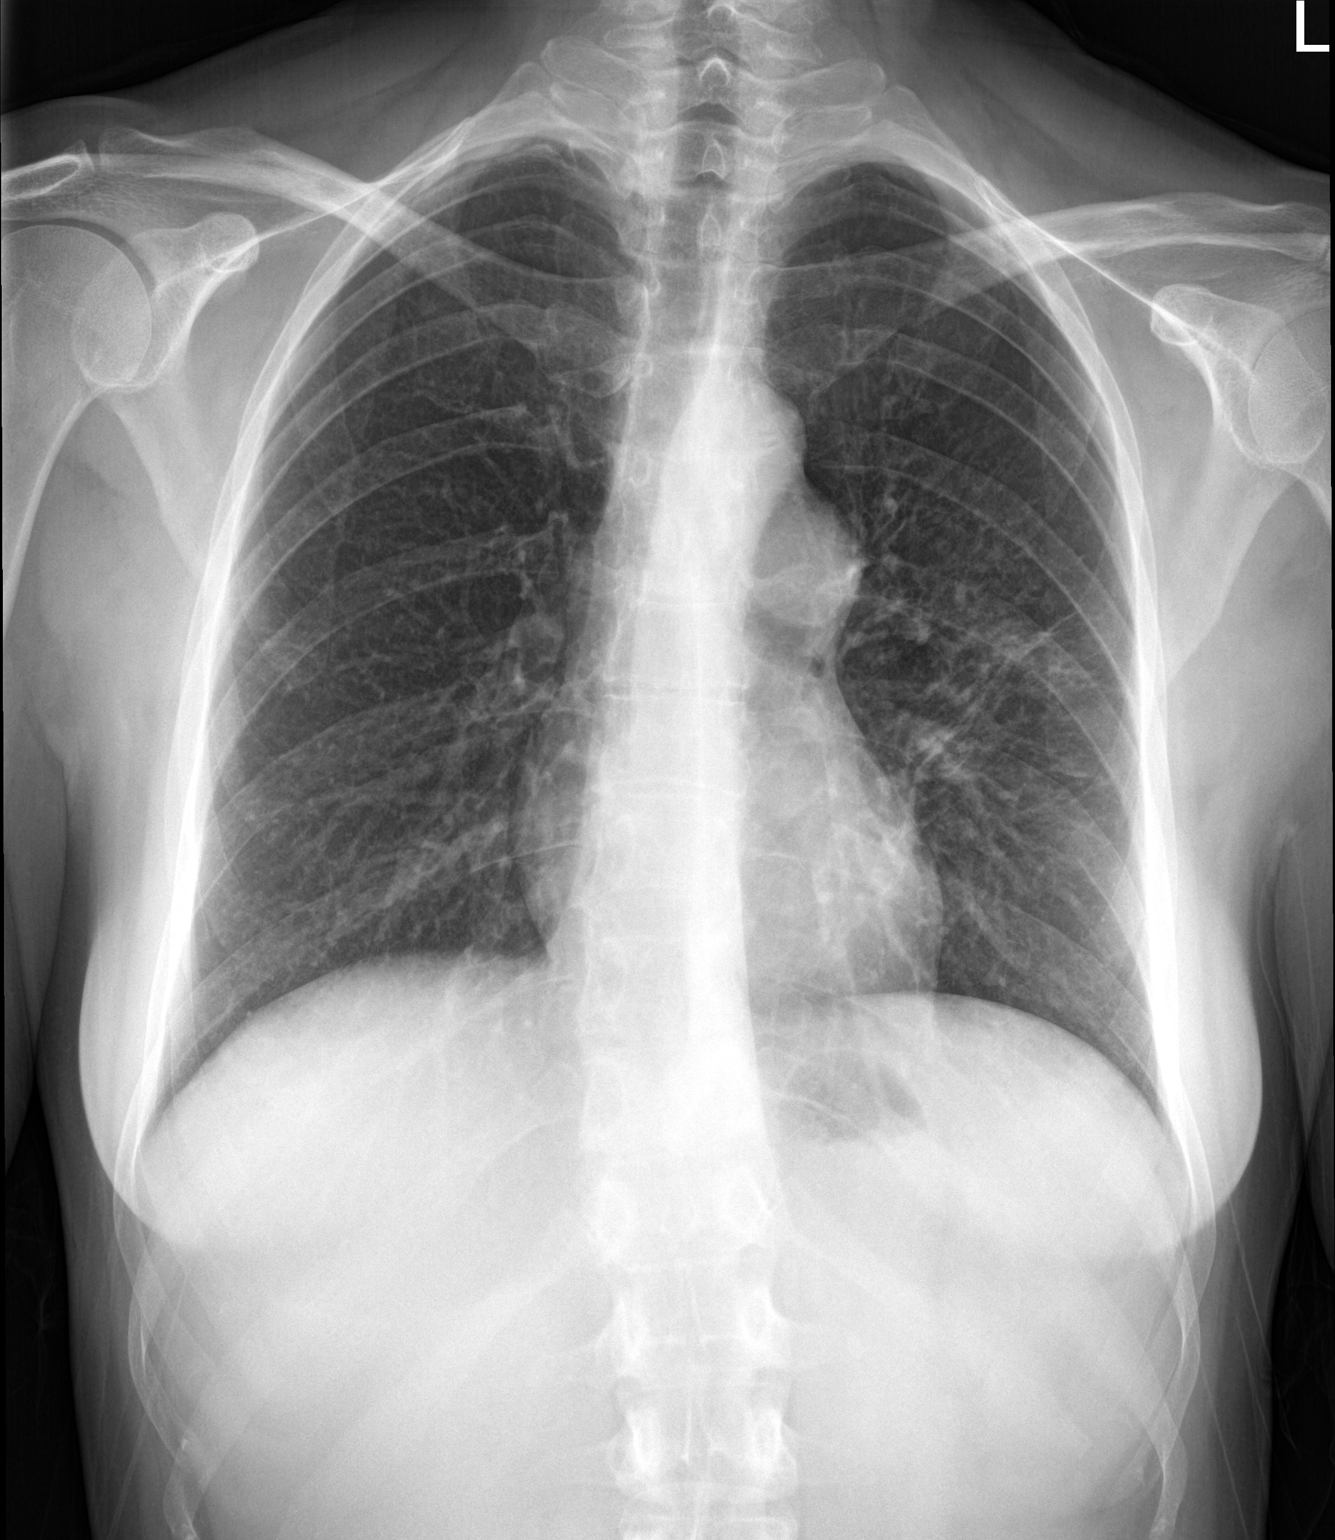

[chest lat]
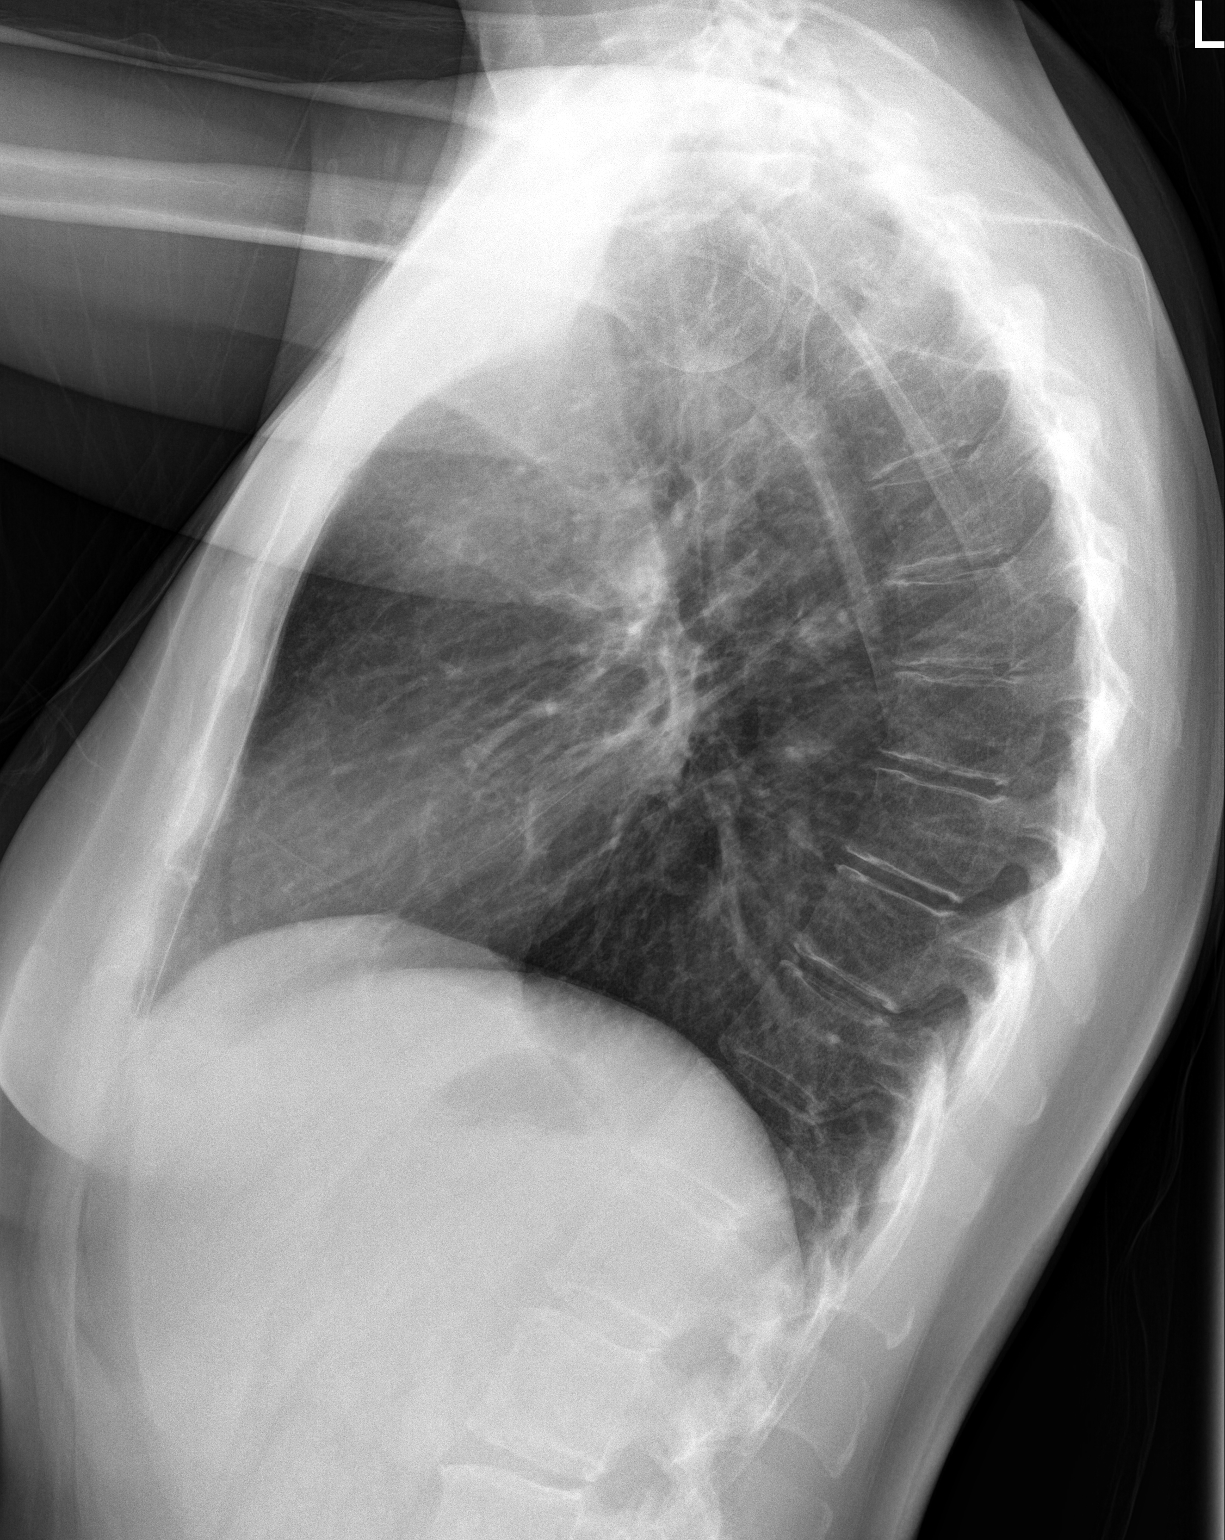

[2 of 2 positions shown; findings below may reference images not displayed]

FINDINGS: Cardiac and mediastinal silhouettes within normal limits.

Lungs normally inflated. Subtle patchy opacity seen within the left
mid lung/perihilar region, which could reflect changes of acute
infectious or aspiration pneumonitis. Lungs are otherwise clear. No
edema or effusion. No pneumothorax.

No acute osseous finding.  Thoracolumbar scoliosis noted.
IMPRESSION: 1. Subtle patchy opacity within the left mid lung/perihilar region,
which could reflect changes of acute infectious or aspiration
pneumonitis.
2. No other active cardiopulmonary disease.

## 2023-01-09 ENCOUNTER — Ambulatory Visit: Payer: Managed Care, Other (non HMO) | Admitting: Nurse Practitioner

## 2023-01-15 ENCOUNTER — Other Ambulatory Visit: Payer: Self-pay | Admitting: Nurse Practitioner

## 2023-01-15 NOTE — Telephone Encounter (Signed)
Requested Prescriptions  Pending Prescriptions Disp Refills   drospirenone-ethinyl estradiol (LO-ZUMANDIMINE) 3-0.02 MG tablet [Pharmacy Med Name: LO  3MG  0.02MG   TAB  ZUMANDIMINE] 84 tablet 0    Sig: TAKE 1 TABLET BY MOUTH DAILY     OB/GYN:  Contraceptives Passed - 01/15/2023  4:20 AM      Passed - Last BP in normal range    BP Readings from Last 1 Encounters:  09/10/22 114/76         Passed - Valid encounter within last 12 months    Recent Outpatient Visits           4 months ago Anxiety   [place] [facility] Larae Grooms, NP   5 months ago Right foot drop   [place] [facility] [place], Clydie Braun, NP   5 months ago Intractable chronic migraine without aura and without status migrainosus   [place] [facility] Larae Grooms, NP   6 months ago Bacterial vaginosis   [place] [facility] Larae Grooms, NP   6 months ago Intractable chronic migraine without aura and without status migrainosus   [place] [facility] Larae Grooms, NP       Future Appointments             In 3 weeks Larae Grooms, NP [place] [facility]            Passed - Patient is not a smoker

## 2023-02-11 ENCOUNTER — Ambulatory Visit: Payer: Managed Care, Other (non HMO) | Admitting: Nurse Practitioner

## 2023-02-11 ENCOUNTER — Encounter: Payer: Self-pay | Admitting: Nurse Practitioner

## 2023-02-11 VITALS — BP 104/69 | HR 74 | Temp 97.9°F | Ht 68.0 in | Wt 135.0 lb

## 2023-02-11 DIAGNOSIS — F32A Depression, unspecified: Secondary | ICD-10-CM

## 2023-02-11 DIAGNOSIS — G43719 Chronic migraine without aura, intractable, without status migrainosus: Secondary | ICD-10-CM

## 2023-02-11 MED ORDER — TOPIRAMATE 100 MG PO TABS: 100.0000 mg | ORAL_TABLET | Freq: Two times a day (BID) | ORAL | 1 refills | Status: DC

## 2023-02-11 MED ORDER — BUPROPION HCL ER (XL) 300 MG PO TB24: 300.0000 mg | ORAL_TABLET | Freq: Every day | ORAL | 1 refills | Status: DC

## 2023-02-11 MED ORDER — BUTALBITAL-APAP-CAFFEINE 50-325-40 MG PO TABS: 1.0000 | ORAL_TABLET | Freq: Four times a day (QID) | ORAL | 2 refills | Status: DC | PRN

## 2023-02-11 MED ORDER — BUPROPION HCL ER (XL) 150 MG PO TB24
150.0000 mg | ORAL_TABLET | Freq: Every day | ORAL | 1 refills | Status: DC
Start: 2023-02-11 — End: 2023-07-08

## 2023-02-11 NOTE — Patient Instructions (Signed)
Contrave- med for [employer]

## 2023-02-11 NOTE — Progress Notes (Unsigned)
BP 104/69 (BP Location: Left Arm, Patient Position: Sitting, Cuff Size: Normal)   Pulse 74   Temp 97.9 F (36.6 C) (Oral)   Ht 5\' 8"  (1.727 m)   Wt 135 lb (61.2 kg)   SpO2 98%   BMI 20.53 kg/m    Subjective:    Patient ID: Monica Long, female    DOB: [birth year], 52 y.o.   MRN: [id number]  HPI: Monica Long is a 52 y.o. female  Chief Complaint  Patient presents with   5 month follow up    Possible menopause    Migraine   Fatigue   Fussy   MIGRAINES Patient state she is taking the Topamax but still having at least 1 migraine per week.  Having to use more Fioricet but it does work to control her migraines.  Using Fioricet PRN for break through migraines.   Duration: chronic Onset: sudden Severity: 7/10   ANXIETY Patient states she is doing a lot better with the Buspar 15mg  BID.  She is also doing the wellbutrin 300mg  daily.  Denies concerns at visit today.  Feels like she is doing okay but does feel like her mood can be improved.   Flowsheet Row Office Visit from 02/11/2023 in [facility]  PHQ-9 Total Score 3         02/11/2023    3:55 PM 09/12/2022    2:30 PM 08/14/2022   11:19 AM 08/09/2022    3:04 PM  GAD 7 : Generalized Anxiety Score  Nervous, Anxious, on Edge 0 2 0 0  Control/stop worrying 0 1 0 0  Worry too much - different things 0 2 0 0  Trouble relaxing 0 2 0 0  Restless 0 0 0 0  Easily annoyed or irritable 0 1 0   Afraid - awful might happen 0 1 0 0  Total GAD 7 Score 0 9 0   Anxiety Difficulty  Extremely difficult Not difficult at all        Relevant past medical, surgical, family and social history reviewed and updated as indicated. Interim medical history since our last visit reviewed. Allergies and medications reviewed and updated.  Review of Systems  Neurological:  Positive for headaches.  Psychiatric/Behavioral:  Negative for dysphoric mood and suicidal ideas. The patient is nervous/anxious.     Per  HPI unless specifically indicated above     Objective:    BP 104/69 (BP Location: Left Arm, Patient Position: Sitting, Cuff Size: Normal)   Pulse 74   Temp 97.9 F (36.6 C) (Oral)   Ht 5\' 8"  (1.727 m)   Wt 135 lb (61.2 kg)   SpO2 98%   BMI 20.53 kg/m   Wt Readings from Last 3 Encounters:  02/11/23 135 lb (61.2 kg)  09/10/22 127 lb 3.2 oz (57.7 kg)  08/14/22 126 lb 9.6 oz (57.4 kg)    Physical Exam Vitals and nursing note reviewed.  Constitutional:      General: She is not in acute distress.    Appearance: Normal appearance. She is normal weight. She is not ill-appearing, toxic-appearing or diaphoretic.  HENT:     Head: Normocephalic.     Right Ear: External ear normal.     Left Ear: External ear normal.     Nose: Nose normal.     Mouth/Throat:     Mouth: Mucous membranes are moist.     Pharynx: Oropharynx is clear.  Eyes:     General:  Right eye: No discharge.        Left eye: No discharge.     Extraocular Movements: Extraocular movements intact.     Conjunctiva/sclera: Conjunctivae normal.     Pupils: Pupils are equal, round, and reactive to light.  Cardiovascular:     Rate and Rhythm: Normal rate and regular rhythm.     Heart sounds: No murmur heard. Pulmonary:     Effort: Pulmonary effort is normal. No respiratory distress.     Breath sounds: Normal breath sounds. No wheezing or rales.  Musculoskeletal:     Cervical back: Normal range of motion and neck supple.  Skin:    General: Skin is warm and dry.     Capillary Refill: Capillary refill takes less than 2 seconds.  Neurological:     General: No focal deficit present.     Mental Status: She is alert and oriented to person, place, and time. Mental status is at baseline.  Psychiatric:        Mood and Affect: Mood normal.        Behavior: Behavior normal.        Thought Content: Thought content normal.        Judgment: Judgment normal.     Results for orders placed or performed in visit on 09/10/22   VAS [place] ABI WITH/WO TBI  Result Value Ref Range   Right ABI 1.10    Left ABI 1.13       Assessment & Plan:   Problem List Items Addressed This Visit   None     Follow up plan: No follow-ups on file.

## 2023-02-12 NOTE — Assessment & Plan Note (Signed)
Chronic.  Ongoing concern.  Will increase Topamax to 100mg  BID.  If not improved, can consider adding maintenance medication such as Aimovig.  Fioticet still working well for break through pain.  Follow up in 1 month.  Call sooner if concerns arise.

## 2023-02-12 NOTE — Assessment & Plan Note (Signed)
Chronic.  Ongoing concern.  Will increase Wellbutrin to 450mg  daily.  Continue with PRN use of Buspar.  If still not well controlled can consider adding SSRI- has tried Zoloft and Lexapro in the past which she did not tolerate.  Can consider Celexa.   Follow up in 1 month.  Call sooner if concerns arise.

## 2023-02-13 ENCOUNTER — Telehealth: Payer: Self-pay | Admitting: Nurse Practitioner

## 2023-02-13 ENCOUNTER — Encounter: Payer: Self-pay | Admitting: Nurse Practitioner

## 2023-02-13 NOTE — Telephone Encounter (Signed)
Patient dropped off document FMLA, to be filled out by provider. Patient requested to send it back via Fax and call patient within 5-days. Document is located in providers tray at front office.Please advise at Mobile [phone] (mobile)

## 2023-02-18 NOTE — Telephone Encounter (Signed)
Paperwork started. Placed in providers folder for completion and signature.

## 2023-02-19 NOTE — Telephone Encounter (Signed)
Paperwork signed.  Please let me know when it is completed.

## 2023-02-19 NOTE — Telephone Encounter (Signed)
Paperwork completed and faxed in for the patient. Copy placed in the scan bin and original placed in the bin for pick up.   Called and LVM notifying patient that paperwork was completed and faxed in for her.

## 2023-03-14 ENCOUNTER — Telehealth: Payer: Managed Care, Other (non HMO) | Admitting: Nurse Practitioner

## 2023-03-21 ENCOUNTER — Encounter: Payer: Self-pay | Admitting: Nurse Practitioner

## 2023-03-21 ENCOUNTER — Ambulatory Visit: Payer: Managed Care, Other (non HMO) | Admitting: Nurse Practitioner

## 2023-03-21 VITALS — BP 113/78 | HR 82 | Temp 97.4°F | Ht 67.5 in | Wt 135.6 lb

## 2023-03-21 DIAGNOSIS — B9689 Other specified bacterial agents as the cause of diseases classified elsewhere: Secondary | ICD-10-CM

## 2023-03-21 DIAGNOSIS — R300 Dysuria: Secondary | ICD-10-CM

## 2023-03-21 DIAGNOSIS — N760 Acute vaginitis: Secondary | ICD-10-CM

## 2023-03-21 LAB — MICROSCOPIC EXAMINATION
Bacteria, UA: NONE SEEN
WBC, UA: NONE SEEN /[HPF] (ref 0–5)

## 2023-03-21 LAB — URINALYSIS, ROUTINE W REFLEX MICROSCOPIC
Bilirubin, UA: NEGATIVE
Glucose, UA: NEGATIVE
Ketones, UA: NEGATIVE
Leukocytes,UA: NEGATIVE
Nitrite, UA: NEGATIVE
Protein,UA: NEGATIVE
Specific Gravity, UA: 1.02 (ref 1.005–1.030)
Urobilinogen, Ur: 0.2 mg/dL (ref 0.2–1.0)
pH, UA: 7 (ref 5.0–7.5)

## 2023-03-21 LAB — WET PREP FOR TRICH, YEAST, CLUE
Clue Cell Exam: POSITIVE — AB
Trichomonas Exam: NEGATIVE
Yeast Exam: NEGATIVE

## 2023-03-21 MED ORDER — METRONIDAZOLE 500 MG PO TABS: 500.0000 mg | ORAL_TABLET | Freq: Two times a day (BID) | ORAL | 0 refills | Status: AC

## 2023-03-21 NOTE — Progress Notes (Signed)
 BP 113/78 (BP Location: Right Arm, Patient Position: Sitting, Cuff Size: Normal)   Pulse 82   Temp (!) 97.4 F (36.3 C) (Oral)   Ht 5' 7.5 (1.715 m)   Wt 135 lb 9.6 oz (61.5 kg)   SpO2 98%   BMI 20.92 kg/m    Subjective:    Patient ID: [name] Drafts, female    DOB: 1970/12/29, 53 y.o.   MRN: [id number]  HPI: Bliss Corner COHICK is a 53 y.o. female  Chief Complaint  Patient presents with   Possible UTI    Symptoms started Tuesday, pain in the lower abdomen and a frequent urgency to urinate    URINARY SYMPTOMS Symptoms started on Tuesday Dysuria: no Urinary frequency: yes Urgency: yes Small volume voids: yes Symptom severity: no Urinary incontinence: no Foul odor: no Hematuria: no Abdominal pain: yes Back pain: no Suprapubic pain/pressure: yes Flank pain: no Fever:  no Vomiting: no Relief with cranberry juice: no Relief with pyridium: no Status: worse Treatments attempted: increasing fluids   Relevant past medical, surgical, family and social history reviewed and updated as indicated. Interim medical history since our last visit reviewed. Allergies and medications reviewed and updated.  Review of Systems  Constitutional:  Negative for fever.  Gastrointestinal:  Positive for abdominal pain. Negative for vomiting.  Genitourinary:  Positive for decreased urine volume, frequency and urgency. Negative for dysuria, flank pain and hematuria.  Musculoskeletal:  Negative for back pain.    Per HPI unless specifically indicated above     Objective:    BP 113/78 (BP Location: Right Arm, Patient Position: Sitting, Cuff Size: Normal)   Pulse 82   Temp (!) 97.4 F (36.3 C) (Oral)   Ht 5' 7.5 (1.715 m)   Wt 135 lb 9.6 oz (61.5 kg)   SpO2 98%   BMI 20.92 kg/m   Wt Readings from Last 3 Encounters:  03/21/23 135 lb 9.6 oz (61.5 kg)  02/11/23 135 lb (61.2 kg)  09/10/22 127 lb 3.2 oz (57.7 kg)    Physical Exam Vitals and nursing note reviewed.  Constitutional:       General: She is not in acute distress.    Appearance: Normal appearance. She is normal weight. She is not ill-appearing, toxic-appearing or diaphoretic.  HENT:     Head: Normocephalic.     Right Ear: External ear normal.     Left Ear: External ear normal.     Nose: Nose normal.     Mouth/Throat:     Mouth: Mucous membranes are moist.     Pharynx: Oropharynx is clear.  Eyes:     General:        Right eye: No discharge.        Left eye: No discharge.     Extraocular Movements: Extraocular movements intact.     Conjunctiva/sclera: Conjunctivae normal.     Pupils: Pupils are equal, round, and reactive to light.  Cardiovascular:     Rate and Rhythm: Normal rate and regular rhythm.     Heart sounds: No murmur heard. Pulmonary:     Effort: Pulmonary effort is normal. No respiratory distress.     Breath sounds: Normal breath sounds. No wheezing or rales.  Abdominal:     General: Abdomen is flat. Bowel sounds are normal. There is no distension.     Palpations: Abdomen is soft.     Tenderness: There is no abdominal tenderness. There is no right CVA tenderness, left CVA tenderness or guarding.  Musculoskeletal:  Cervical back: Normal range of motion and neck supple.  Skin:    General: Skin is warm and dry.     Capillary Refill: Capillary refill takes less than 2 seconds.  Neurological:     General: No focal deficit present.     Mental Status: She is alert and oriented to person, place, and time. Mental status is at baseline.  Psychiatric:        Mood and Affect: Mood normal.        Behavior: Behavior normal.        Thought Content: Thought content normal.        Judgment: Judgment normal.     Results for orders placed or performed in visit on 09/10/22  VAS US  ABI WITH/WO TBI   Collection Time: 09/10/22  1:05 PM  Result Value Ref Range   Right ABI 1.10    Left ABI 1.13       Assessment & Plan:   Problem List Items Addressed This Visit   None Visit Diagnoses        Dysuria    -  Primary   Relevant Orders   Urinalysis, Routine w reflex microscopic   WET PREP FOR TRICH, YEAST, CLUE        Follow up plan: No follow-ups on file.

## 2023-03-22 ENCOUNTER — Encounter: Payer: Self-pay | Admitting: Nurse Practitioner

## 2023-03-24 ENCOUNTER — Encounter: Payer: Self-pay | Admitting: Nurse Practitioner

## 2023-03-26 ENCOUNTER — Other Ambulatory Visit: Payer: Self-pay

## 2023-03-26 ENCOUNTER — Emergency Department: Payer: Managed Care, Other (non HMO)

## 2023-03-26 DIAGNOSIS — R109 Unspecified abdominal pain: Secondary | ICD-10-CM | POA: Diagnosis present

## 2023-03-26 DIAGNOSIS — N132 Hydronephrosis with renal and ureteral calculous obstruction: Secondary | ICD-10-CM | POA: Insufficient documentation

## 2023-03-26 LAB — URINALYSIS, ROUTINE W REFLEX MICROSCOPIC
Bilirubin Urine: NEGATIVE
Glucose, UA: NEGATIVE mg/dL
Ketones, ur: NEGATIVE mg/dL
Nitrite: NEGATIVE
Protein, ur: 100 mg/dL — AB
RBC / HPF: >50 RBC/hpf (ref 0–5)
Specific Gravity, Urine: 1.029 (ref 1.005–1.030)
pH: 5 (ref 5.0–8.0)

## 2023-03-26 LAB — BASIC METABOLIC PANEL
Anion gap: 11 (ref 5–15)
BUN: 20 mg/dL (ref 6–20)
CO2: 24 mmol/L (ref 22–32)
Calcium: 9 mg/dL (ref 8.9–10.3)
Chloride: 102 mmol/L (ref 98–111)
Creatinine, Ser: 1.18 mg/dL — ABNORMAL HIGH (ref 0.44–1.00)
GFR, Estimated: 56 mL/min — ABNORMAL LOW (ref >60)
Glucose, Bld: 119 mg/dL — ABNORMAL HIGH (ref 70–99)
Potassium: 4.2 mmol/L (ref 3.5–5.1)
Sodium: 137 mmol/L (ref 135–145)

## 2023-03-26 LAB — CBC WITH DIFFERENTIAL/PLATELET
Abs Immature Granulocytes: 0.02 10*3/uL (ref 0.00–0.07)
Basophils Absolute: 0.1 10*3/uL (ref 0.0–0.1)
Basophils Relative: 1 %
Eosinophils Absolute: 0.2 10*3/uL (ref 0.0–0.5)
Eosinophils Relative: 3 %
HCT: 38.2 % (ref 36.0–46.0)
Hemoglobin: 13.2 g/dL (ref 12.0–15.0)
Immature Granulocytes: 0 %
Lymphocytes Relative: 32 %
Lymphs Abs: 2.8 10*3/uL (ref 0.7–4.0)
MCH: 28.9 pg (ref 26.0–34.0)
MCHC: 34.6 g/dL (ref 30.0–36.0)
MCV: 83.6 fL (ref 80.0–100.0)
Monocytes Absolute: 0.9 10*3/uL (ref 0.1–1.0)
Monocytes Relative: 10 %
Neutro Abs: 4.7 10*3/uL (ref 1.7–7.7)
Neutrophils Relative %: 54 %
Platelets: 251 10*3/uL (ref 150–400)
RBC: 4.57 MIL/uL (ref 3.87–5.11)
RDW: 14.3 % (ref 11.5–15.5)
WBC: 8.6 10*3/uL (ref 4.0–10.5)
nRBC: 0 % (ref 0.0–0.2)

## 2023-03-26 LAB — POC URINE PREG, ED: Preg Test, Ur: NEGATIVE

## 2023-03-26 MED ORDER — KETOROLAC TROMETHAMINE 30 MG/ML IJ SOLN
15.0000 mg | Freq: Once | INTRAMUSCULAR | Status: AC
  Administered 2023-03-27: 15 mg via INTRAVENOUS
  Filled 2023-03-26: qty 1

## 2023-03-26 MED ORDER — ONDANSETRON HCL 4 MG/2ML IJ SOLN
4.0000 mg | INTRAMUSCULAR | Status: AC
  Administered 2023-03-27: 4 mg via INTRAVENOUS
  Filled 2023-03-26: qty 2

## 2023-03-26 MED ORDER — MORPHINE SULFATE (PF) 4 MG/ML IV SOLN
4.0000 mg | Freq: Once | INTRAVENOUS | Status: AC
  Administered 2023-03-27: 4 mg via INTRAVENOUS
  Filled 2023-03-26: qty 1

## 2023-03-26 MED ORDER — CIPROFLOXACIN HCL 500 MG PO TABS: 500.0000 mg | ORAL_TABLET | Freq: Two times a day (BID) | ORAL | 0 refills | Status: AC

## 2023-03-26 NOTE — ED Triage Notes (Signed)
 Pt reports testing + for BV and UTI a few days ago, pt has been taking prescribed abx. Pt reports left side flank pain that began a 2 days ago, denies blood in urine. Pt reports hx kidney stones

## 2023-03-27 ENCOUNTER — Emergency Department
Admission: EM | Admit: 2023-03-27 | Discharge: 2023-03-27 | Disposition: A | Discharge status: Home / Self Care | Payer: Managed Care, Other (non HMO) | Attending: Emergency Medicine | Admitting: Emergency Medicine

## 2023-03-27 DIAGNOSIS — R109 Unspecified abdominal pain: Secondary | ICD-10-CM

## 2023-03-27 DIAGNOSIS — N200 Calculus of kidney: Secondary | ICD-10-CM

## 2023-03-27 MED ORDER — TAMSULOSIN HCL 0.4 MG PO CAPS: ORAL_CAPSULE | ORAL | 0 refills | Status: DC

## 2023-03-27 MED ORDER — DOCUSATE SODIUM 100 MG PO CAPS: ORAL_CAPSULE | ORAL | 0 refills | Status: DC

## 2023-03-27 MED ORDER — OXYCODONE-ACETAMINOPHEN 5-325 MG PO TABS: 2.0000 | ORAL_TABLET | Freq: Four times a day (QID) | ORAL | 0 refills | Status: DC | PRN

## 2023-03-27 MED ORDER — ONDANSETRON 4 MG PO TBDP: ORAL_TABLET | ORAL | 0 refills | Status: DC

## 2023-03-27 NOTE — ED Provider Notes (Signed)
 [facility] Provider Note    Event Date/Time   First MD Initiated Contact with Patient 03/27/23 0012     (approximate)   History   Flank Pain   HPI Monica Long is a 53 y.o. female who presents for evaluation of several days of intermittent left-sided flank pain and left-sided abdominal pain.  She has had some associated nausea and vomiting as well.  Tonight the pain got very severe unlike anything she has experienced in the past so she came to the emergency department.  She notes that she was seen at her primary care office when the symptoms first started and diagnosed with bacterial vaginosis and started on Flagyl .  She then followed up at an urgent care when her symptoms persisted and was told she had a urinary tract infection so she was started on antibiotics for that.  She has had a kidney stone in the past but said it felt nothing as bad as her pain tonight.  No recent fever, no dysuria.     Physical Exam   Triage Vital Signs: ED Triage Vitals  Encounter Vitals Group     BP 03/26/23 2156 132/65     Systolic BP Percentile --      Diastolic BP Percentile --      Pulse Rate 03/26/23 2156 83     Resp 03/26/23 2156 18     Temp 03/26/23 2156 97.7 F (36.5 C)     Temp Source 03/26/23 2156 Oral     SpO2 03/26/23 2156 100 %     Weight 03/26/23 2155 61.2 kg (135 lb)     Height 03/26/23 2155 1.702 m (5' 7)     Head Circumference --      Peak Flow --      Pain Score 03/26/23 2155 8     Pain Loc --      Pain Education --      Exclude from Growth Chart --     Most recent vital signs: Vitals:   03/26/23 2156  BP: 132/65  Pulse: 83  Resp: 18  Temp: 97.7 F (36.5 C)  SpO2: 100%    General: Awake, uncomfortable but nontoxic. CV:  Good peripheral perfusion.  Regular rate and rhythm. Resp:  Normal effort. Speaking easily and comfortably, no accessory muscle usage nor intercostal retractions.   Abd:  No distention.  Mild tenderness to  palpation of the left side of the abdomen and the left flank.  No rebound or guarding.   ED Results / Procedures / Treatments   Labs (all labs ordered are listed, but only abnormal results are displayed) Labs Reviewed  BASIC METABOLIC PANEL - Abnormal; Notable for the following components:      Result Value   Glucose, Bld 119 (*)    Creatinine, Ser 1.18 (*)    GFR, Estimated 56 (*)    All other components within normal limits  URINALYSIS, ROUTINE W REFLEX MICROSCOPIC - Abnormal; Notable for the following components:   Color, Urine YELLOW (*)    APPearance HAZY (*)    Hgb urine dipstick SMALL (*)    Protein, ur 100 (*)    Leukocytes,Ua TRACE (*)    Bacteria, UA RARE (*)    All other components within normal limits  URINE CULTURE  CBC WITH DIFFERENTIAL/PLATELET  POC URINE PREG, ED     RADIOLOGY I viewed and interpreted the patient's CT of the abdomen and pelvis renal stone protocol and I can see  a stone in the left ureter.  Radiologist confirmed left UPJ stone with hydroureter and hydronephrosis.   PROCEDURES:  Critical Care performed: No  Procedures    IMPRESSION / MDM / ASSESSMENT AND PLAN / ED COURSE  I reviewed the triage vital signs and the nursing notes.                              Differential diagnosis includes, but is not limited to, kidney stone/ureteral stone, UTI/pyelonephritis, musculoskeletal strain, SBO/ileus.  Patient's presentation is most consistent with acute presentation with potential threat to life or bodily function.  Labs/studies ordered: CT renal stone study, CBC with differential, BMP, urine pregnancy test, urine culture, urinalysis  Interventions/Medications given:  Medications  morphine  (PF) 4 MG/ML injection 4 mg (4 mg Intravenous Given 03/27/23 0037)  ketorolac  (TORADOL ) 30 MG/ML injection 15 mg (15 mg Intravenous Given 03/27/23 0037)  ondansetron  (ZOFRAN ) injection 4 mg (4 mg Intravenous Given 03/27/23 0036)    (Note:  hospital course  my include additional interventions and/or labs/studies not listed above.)   Patient's pain and history are consistent with ureteral stone, confirmed by CT scan as described above.  Mild elevation of creatinine but patient is tolerating oral intake.  Urinalysis shows hematuria without evidence of infection but since she is on antibiotics I counseled her to continue the full course of treatment that she has been taking as an outpatient.  No leukocytosis which is reassuring.  I am treating with medications as listed above and had my usual and customary kidney stone discussion with the patient.  If her pain is well-controlled she agrees with the plan for discharge and outpatient follow-up.     Clinical Course as of 03/27/23 0136  Wed Mar 27, 2023  0121 Reassessed patient, she is pain-free and ready to go.  Prescriptions as listed below, usual/customary follow up recommendations and return precautions. [CF]    Clinical Course User Index [CF] Gordan Huxley, MD     FINAL CLINICAL IMPRESSION(S) / ED DIAGNOSES   Final diagnoses:  Left flank pain  Kidney stone     Rx / DC Orders   ED Discharge Orders          Ordered    oxyCODONE -acetaminophen  (PERCOCET) 5-325 MG tablet  Every 6 hours PRN        03/27/23 0124    ondansetron  (ZOFRAN -ODT) 4 MG disintegrating tablet        03/27/23 0124    tamsulosin  (FLOMAX ) 0.4 MG CAPS capsule        03/27/23 0124    docusate sodium  (COLACE) 100 MG capsule        03/27/23 0124             Note:  This document was prepared using Dragon voice recognition software and may include unintentional dictation errors.   Gordan Huxley, MD 03/27/23 [phone]

## 2023-03-27 NOTE — Discharge Instructions (Addendum)
You have been seen in the Emergency Department (ED) today for pain caused by kidney stones.  As we have discussed, please drink plenty of fluids.  Please make a follow up appointment with the physician(s) listed elsewhere in this documentation.  You may take pain medication as needed but ONLY as prescribed.  Please also take your prescribed Flomax daily.  If you are going to follow up with Urology for possible lithotripsy, please do not take any ibuprofen, naproxen, aspirin, Toradol, or other NSAID after Tuesday morning, as this may exclude you from lithotripsy.  Please see your doctor as soon as possible as stones may take 1-3 weeks to pass and you may require additional care or medications.  Do not drink alcohol, drive or participate in any other potentially dangerous activities while taking opiate pain medication as it may make you sleepy. Do not take this medication with any other sedating medications, either prescription or over-the-counter. If you were prescribed Percocet or Vicodin, do not take these with acetaminophen (Tylenol) as it is already contained within these medications.   Take Percocet as needed for severe pain.  This medication is an opiate (or narcotic) pain medication and can be habit forming.  Use it as little as possible to achieve adequate pain control.  Do not use or use it with extreme caution if you have a history of opiate abuse or dependence.  If you are on a pain contract with your primary care doctor or a pain specialist, be sure to let them know you were prescribed this medication today from the [facility].  This medication is intended for your use only - do not give any to anyone else and keep it in a secure place where nobody else, especially children, have access to it.  It will also cause or worsen constipation, so you may want to consider taking an over-the-counter stool softener while you are taking this medication.  Return to the Emergency  Department (ED) or call your doctor if you have any worsening pain, fever, painful urination, are unable to urinate, or develop other symptoms that concern you.  

## 2023-03-28 ENCOUNTER — Telehealth: Payer: Self-pay | Admitting: Urology

## 2023-03-28 LAB — URINE CULTURE: Culture: NO GROWTH

## 2023-03-28 NOTE — Telephone Encounter (Signed)
 Telephone call about setting up an appointment with Dr. Apolinar Junes. She was in the ER Tuesday night with Kidney Stones. She has not been seen here before.

## 2023-04-02 ENCOUNTER — Encounter: Payer: Self-pay | Admitting: Nurse Practitioner

## 2023-04-02 NOTE — Telephone Encounter (Signed)
 Chart reviewed.  Stone is very small and will likely pass spontaneously.  Recommend ~1-2 weeks unless she passes it, then PRN.  Any MD is fine.

## 2023-04-05 ENCOUNTER — Ambulatory Visit: Payer: Managed Care, Other (non HMO) | Admitting: Pediatrics

## 2023-04-05 ENCOUNTER — Encounter: Payer: Self-pay | Admitting: Pediatrics

## 2023-04-05 VITALS — BP 95/60 | HR 76 | Temp 98.7°F | Resp 16 | Wt 135.4 lb

## 2023-04-05 DIAGNOSIS — Z1330 Encounter for screening examination for mental health and behavioral disorders, unspecified: Secondary | ICD-10-CM

## 2023-04-05 DIAGNOSIS — Z87442 Personal history of urinary calculi: Secondary | ICD-10-CM | POA: Diagnosis not present

## 2023-04-05 DIAGNOSIS — R21 Rash and other nonspecific skin eruption: Secondary | ICD-10-CM | POA: Diagnosis not present

## 2023-04-05 MED ORDER — METHYLPREDNISOLONE 4 MG PO TBPK: ORAL_TABLET | ORAL | 0 refills | Status: DC

## 2023-04-05 MED ORDER — LIDOCAINE-PRILOCAINE 2.5-2.5 % EX CREA: 1.0000 | TOPICAL_CREAM | CUTANEOUS | 0 refills | Status: DC | PRN

## 2023-04-05 NOTE — Progress Notes (Signed)
Office Visit  BP 95/60 (BP Location: Right Arm, Patient Position: Sitting, Cuff Size: Normal)   Pulse 76   Temp 98.7 F (37.1 C) (Oral)   Resp 16   Wt 135 lb 6.4 oz (61.4 kg)   LMP 02/16/2023 (Exact Date)   SpO2 98%   BMI 21.21 kg/m    Subjective:    Patient ID: Monica Long, female    DOB: 07-28-1970, 53 y.o.   MRN: [id number]  HPI: Monica Long is a 53 y.o. female  Chief Complaint  Patient presents with   flushed    Started Tuesday. Whole body looked like she was sunburned, benadryl helped some. Feels hot but no fever. Only thing she could think of was a new collagen powder to drink    Discussed the use of AI scribe software for clinical note transcription with the patient, who gave verbal consent to proceed.  History of Present Illness   The patient, with a recent history of a kidney stone, presents with a generalized rash that started on Wednesday morning. She describes the rash as resembling a severe sunburn, initially non-painful but later becoming itchy. The rash was most severe on the stomach and chest, but also involved the face. The patient took Benadryl, which helped with facial flushing and burning. The rash has been gradually improving, but the stomach area remains significantly affected.  The patient denies any new medications or exposures. She was treated in the ER for a kidney stone the previous Tuesday, where she received Flomax, Percocet, and Zofran. The patient took Flomax for a few days following the ER visit. She has taken Percocet and Zofran in the past without any adverse reactions.  The only new substance the patient had ingested was a collagen drink on Tuesday morning, the day before the rash started. This was a different brand than she had taken before. The patient denies any systemic symptoms such as shortness of breath, nausea, changes in bowel function, or recent illnesses. She also denies any new sun exposure, bug bites, or swelling. The rash  has not started to peel and is not painful, but is significantly itchy.      Relevant past medical, surgical, family and social history reviewed and updated as indicated. Interim medical history since our last visit reviewed. Allergies and medications reviewed and updated.  ROS per HPI unless specifically indicated above     Objective:    BP 95/60 (BP Location: Right Arm, Patient Position: Sitting, Cuff Size: Normal)   Pulse 76   Temp 98.7 F (37.1 C) (Oral)   Resp 16   Wt 135 lb 6.4 oz (61.4 kg)   LMP 02/16/2023 (Exact Date)   SpO2 98%   BMI 21.21 kg/m   Wt Readings from Last 3 Encounters:  04/05/23 135 lb 6.4 oz (61.4 kg)  03/26/23 135 lb (61.2 kg)  03/21/23 135 lb 9.6 oz (61.5 kg)     Physical Exam Constitutional:      Appearance: Normal appearance.  Pulmonary:     Effort: Pulmonary effort is normal.  Musculoskeletal:        General: Normal range of motion.  Skin:    Findings: Rash present.     Comments: See image below  Neurological:     General: No focal deficit present.     Mental Status: She is alert. Mental status is at baseline.  Psychiatric:        Mood and Affect: Mood normal.  Behavior: Behavior normal.        Thought Content: Thought content normal.               03/21/2023    2:38 PM 02/11/2023    3:55 PM 09/12/2022    2:28 PM 08/14/2022   11:19 AM 08/09/2022    3:04 PM  Depression screen PHQ 2/9  Decreased Interest 0 0 2 0 0  Down, Depressed, Hopeless 0 0 3 0 0  PHQ - 2 Score 0 0 5 0 0  Altered sleeping 0 0 0 0 0  Tired, decreased energy 3 3 1  0 0  Change in appetite 0 0 0 0 0  Feeling bad or failure about yourself  0 0 0 0 0  Trouble concentrating 0 0 0 0 0  Moving slowly or fidgety/restless 0 0 0 0 0  Suicidal thoughts 0 0 0 0 0  PHQ-9 Score 3 3 6  0 0  Difficult doing work/chores   Extremely dIfficult Not difficult at all        03/21/2023    2:38 PM 02/11/2023    3:55 PM 09/12/2022    2:30 PM 08/14/2022   11:19 AM  GAD 7  : Generalized Anxiety Score  Nervous, Anxious, on Edge 0 0 2 0  Control/stop worrying 0 0 1 0  Worry too much - different things 0 0 2 0  Trouble relaxing 0 0 2 0  Restless 0 0 0 0  Easily annoyed or irritable 0 0 1 0  Afraid - awful might happen 0 0 1 0  Total GAD 7 Score 0 0 9 0  Anxiety Difficulty   Extremely difficult Not difficult at all       Assessment & Plan:  Assessment & Plan   Rash History of kidney stones Recent episode of kidney stone with associated pain managed with Flomax, Percocet, and Zofran. No new stones reported at this visit. New onset, diffuse, non-painful, pruritic rash with no associated systemic symptoms. No new medications, but recent exposure to a new collagen drink. No history of similar rash. No mucosal involvement or desquamation. Unclear etiology, do not suspect drug eruption rash. Will bring ingredients from her protein shake as this is the only exposure. Consider toxic shock if develops repeat kidney stone pain, no other exposures. Noted elevated creatinine on recent labs, possibly secondary to recent kidney stone episode. -Repeat renal function tests today to assess for any changes. -Start Medrol pack to reduce inflammation and itching.  -Apply Emla cream for symptomatic relief of itching. -Return for follow-up in 1 week to assess response to treatment. Consider biopsy if rash is not improving. -     methylPREDNISolone; Take as directed in packaging  Dispense: 21 each; Refill: 0 -     Lidocaine-Prilocaine; Apply 1 Application topically as needed.  Dispense: 30 g; Refill: 0 -     Basic metabolic panel -     CBC with Differential/Platelet  Encounter for behavioral health screen As part of their intake evaluation, the patient was screened for depression, anxiety.  PHQ9 SCORE 3, GAD7 SCORE 0. Screening results negative for tested conditions. CTM.  Follow up plan: Return in about 1 week (around 04/12/2023) for rash.  Darcee Dekker Howell Pringle, MD   Approximately  30 minutes spent on patient encounter today including assessment, counseling, diagnosing, treatment plan development, and charting.

## 2023-04-05 NOTE — Patient Instructions (Addendum)
Medrol  EMLA - cream for body

## 2023-04-06 LAB — CBC WITH DIFFERENTIAL/PLATELET
Basophils Absolute: 0 10*3/uL (ref 0.0–0.2)
Basos: 0 %
EOS (ABSOLUTE): 0.5 10*3/uL — ABNORMAL HIGH (ref 0.0–0.4)
Eos: 9 %
Hematocrit: 38.5 % (ref 34.0–46.6)
Hemoglobin: 12.4 g/dL (ref 11.1–15.9)
Immature Grans (Abs): 0 10*3/uL (ref 0.0–0.1)
Immature Granulocytes: 1 %
Lymphocytes Absolute: 1.5 10*3/uL (ref 0.7–3.1)
Lymphs: 25 %
MCH: 28.4 pg (ref 26.6–33.0)
MCHC: 32.2 g/dL (ref 31.5–35.7)
MCV: 88 fL (ref 79–97)
Monocytes Absolute: 0.4 10*3/uL (ref 0.1–0.9)
Monocytes: 7 %
Neutrophils Absolute: 3.4 10*3/uL (ref 1.4–7.0)
Neutrophils: 58 %
Platelets: 231 10*3/uL (ref 150–450)
RBC: 4.37 x10E6/uL (ref 3.77–5.28)
RDW: 13.3 % (ref 11.7–15.4)
WBC: 5.9 10*3/uL (ref 3.4–10.8)

## 2023-04-06 LAB — BASIC METABOLIC PANEL
BUN/Creatinine Ratio: 15 (ref 9–23)
BUN: 15 mg/dL (ref 6–24)
CO2: 23 mmol/L (ref 20–29)
Calcium: 9.5 mg/dL (ref 8.7–10.2)
Chloride: 101 mmol/L (ref 96–106)
Creatinine, Ser: 1.01 mg/dL — ABNORMAL HIGH (ref 0.57–1.00)
Glucose: 92 mg/dL (ref 70–99)
Potassium: 4.1 mmol/L (ref 3.5–5.2)
Sodium: 140 mmol/L (ref 134–144)
eGFR: 67 mL/min/{1.73_m2} (ref >59)

## 2023-04-10 ENCOUNTER — Telehealth: Payer: Managed Care, Other (non HMO) | Admitting: Nurse Practitioner

## 2023-04-16 ENCOUNTER — Ambulatory Visit: Payer: Managed Care, Other (non HMO) | Admitting: Nurse Practitioner

## 2023-04-16 NOTE — Progress Notes (Deleted)
   LMP 02/16/2023 (Exact Date)    Subjective:    Patient ID: Monica Long, female    DOB: [birth year], 53 y.o.   MRN: [id number]  HPI: Monica Long is a 53 y.o. female  No chief complaint on file.   Relevant past medical, surgical, family and social history reviewed and updated as indicated. Interim medical history since our last visit reviewed. Allergies and medications reviewed and updated.  Review of Systems  Per HPI unless specifically indicated above     Objective:    LMP 02/16/2023 (Exact Date)   Wt Readings from Last 3 Encounters:  04/05/23 135 lb 6.4 oz (61.4 kg)  03/26/23 135 lb (61.2 kg)  03/21/23 135 lb 9.6 oz (61.5 kg)    Physical Exam  Results for orders placed or performed in visit on 04/05/23  Basic Metabolic Panel (BMET)   Collection Time: 04/05/23  9:58 AM  Result Value Ref Range   Glucose 92 70 - 99 mg/dL   BUN 15 6 - 24 mg/dL   Creatinine, Ser 4.13 (H) 0.57 - 1.00 mg/dL   eGFR 67 >24 MW/NUU/7.25   BUN/Creatinine Ratio 15 9 - 23   Sodium 140 134 - 144 mmol/L   Potassium 4.1 3.5 - 5.2 mmol/L   Chloride 101 96 - 106 mmol/L   CO2 23 20 - 29 mmol/L   Calcium 9.5 8.7 - 10.2 mg/dL  CBC w/Diff   Collection Time: 04/05/23  9:58 AM  Result Value Ref Range   WBC 5.9 3.4 - 10.8 x10E3/uL   RBC 4.37 3.77 - 5.28 x10E6/uL   Hemoglobin 12.4 11.1 - 15.9 g/dL   Hematocrit 36.6 44.0 - 46.6 %   MCV 88 79 - 97 fL   MCH 28.4 26.6 - 33.0 pg   MCHC 32.2 31.5 - 35.7 g/dL   RDW 34.7 42.5 - 95.6 %   Platelets 231 150 - 450 x10E3/uL   Neutrophils 58 Not Estab. %   Lymphs 25 Not Estab. %   Monocytes 7 Not Estab. %   Eos 9 Not Estab. %   Basos 0 Not Estab. %   Neutrophils Absolute 3.4 1.4 - 7.0 x10E3/uL   Lymphocytes Absolute 1.5 0.7 - 3.1 x10E3/uL   Monocytes Absolute 0.4 0.1 - 0.9 x10E3/uL   EOS (ABSOLUTE) 0.5 (H) 0.0 - 0.4 x10E3/uL   Basophils Absolute 0.0 0.0 - 0.2 x10E3/uL   Immature Granulocytes 1 Not Estab. %   Immature Grans (Abs) 0.0 0.0 - 0.1  x10E3/uL      Assessment & Plan:   Problem List Items Addressed This Visit   None    Follow up plan: No follow-ups on file.

## 2023-04-17 ENCOUNTER — Encounter: Payer: Self-pay | Admitting: Nurse Practitioner

## 2023-04-17 ENCOUNTER — Other Ambulatory Visit: Payer: Self-pay | Admitting: Nurse Practitioner

## 2023-04-18 NOTE — Telephone Encounter (Signed)
Requested Prescriptions  Pending Prescriptions Disp Refills   drospirenone-ethinyl estradiol (LO-ZUMANDIMINE) 3-0.02 MG tablet [Pharmacy Med Name: LO  3MG  0.02MG   TAB  ZUMANDIMINE] 84 tablet 0    Sig: TAKE 1 TABLET BY MOUTH DAILY     OB/GYN:  Contraceptives Passed - 04/18/2023  2:49 PM      Passed - Last BP in normal range    BP Readings from Last 1 Encounters:  04/05/23 95/60         Passed - Valid encounter within last 12 months    Recent Outpatient Visits           1 week ago Rash   [place] [facility] Jackolyn Confer, MD   4 weeks ago Bacterial vaginosis   [place] [facility] Larae Grooms, NP   2 months ago Intractable 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

## 2023-04-22 ENCOUNTER — Ambulatory Visit: Payer: Self-pay | Admitting: Nurse Practitioner

## 2023-04-29 ENCOUNTER — Ambulatory Visit: Payer: Managed Care, Other (non HMO) | Admitting: Nurse Practitioner

## 2023-04-29 NOTE — Progress Notes (Deleted)
 LMP 02/16/2023 (Exact Date)    Subjective:    Patient ID: Monica Long, female    DOB: [birth year], 53 y.o.   MRN: [id number]  HPI: Monica Long is a 53 y.o. female  No chief complaint on file.  MIGRAINES Patient state she is taking the Topamax but still having at least 1 migraine per week.  Having to use more Fioricet but it does work to control her migraines.  Using Fioricet PRN for break through migraines.   Duration: chronic Onset: sudden Severity: 7/10   ANXIETY Patient states she is doing a lot better with the Buspar 15mg  BID.  She is also doing the wellbutrin 300mg  daily.  Denies concerns at visit today.  Feels like she is doing okay but does feel like her mood can be improved.   Flowsheet Row Office Visit from 03/21/2023 in [facility]  PHQ-9 Total Score 3         03/21/2023    2:38 PM 02/11/2023    3:55 PM 09/12/2022    2:30 PM 08/14/2022   11:19 AM  GAD 7 : Generalized Anxiety Score  Nervous, Anxious, on Edge 0 0 2 0  Control/stop worrying 0 0 1 0  Worry too much - different things 0 0 2 0  Trouble relaxing 0 0 2 0  Restless 0 0 0 0  Easily annoyed or irritable 0 0 1 0  Afraid - awful might happen 0 0 1 0  Total GAD 7 Score 0 0 9 0  Anxiety Difficulty   Extremely difficult Not difficult at all       Relevant past medical, surgical, family and social history reviewed and updated as indicated. Interim medical history since our last visit reviewed. Allergies and medications reviewed and updated.  Review of Systems  Neurological:  Positive for headaches.  Psychiatric/Behavioral:  Negative for dysphoric mood and suicidal ideas. The patient is nervous/anxious.     Per HPI unless specifically indicated above     Objective:    LMP 02/16/2023 (Exact Date)   Wt Readings from Last 3 Encounters:  04/05/23 135 lb 6.4 oz (61.4 kg)  03/26/23 135 lb (61.2 kg)  03/21/23 135 lb 9.6 oz (61.5 kg)    Physical Exam Vitals and nursing  note reviewed.  Constitutional:      General: She is not in acute distress.    Appearance: Normal appearance. She is normal weight. She is not ill-appearing, toxic-appearing or diaphoretic.  HENT:     Head: Normocephalic.     Right Ear: External ear normal.     Left Ear: External ear normal.     Nose: Nose normal.     Mouth/Throat:     Mouth: Mucous membranes are moist.     Pharynx: Oropharynx is clear.  Eyes:     General:        Right eye: No discharge.        Left eye: No discharge.     Extraocular Movements: Extraocular movements intact.     Conjunctiva/sclera: Conjunctivae normal.     Pupils: Pupils are equal, round, and reactive to light.  Cardiovascular:     Rate and Rhythm: Normal rate and regular rhythm.     Heart sounds: No murmur heard. Pulmonary:     Effort: Pulmonary effort is normal. No respiratory distress.     Breath sounds: Normal breath sounds. No wheezing or rales.  Musculoskeletal:     Cervical back: Normal range of  motion and neck supple.  Skin:    General: Skin is warm and dry.     Capillary Refill: Capillary refill takes less than 2 seconds.  Neurological:     General: No focal deficit present.     Mental Status: She is alert and oriented to person, place, and time. Mental status is at baseline.  Psychiatric:        Mood and Affect: Mood normal.        Behavior: Behavior normal.        Thought Content: Thought content normal.        Judgment: Judgment normal.    Results for orders placed or performed in visit on 04/05/23  Basic Metabolic Panel (BMET)   Collection Time: 04/05/23  9:58 AM  Result Value Ref Range   Glucose 92 70 - 99 mg/dL   BUN 15 6 - 24 mg/dL   Creatinine, Ser 1.61 (H) 0.57 - 1.00 mg/dL   eGFR 67 >09 UE/AVW/0.98   BUN/Creatinine Ratio 15 9 - 23   Sodium 140 134 - 144 mmol/L   Potassium 4.1 3.5 - 5.2 mmol/L   Chloride 101 96 - 106 mmol/L   CO2 23 20 - 29 mmol/L   Calcium 9.5 8.7 - 10.2 mg/dL  CBC w/Diff   Collection Time:  04/05/23  9:58 AM  Result Value Ref Range   WBC 5.9 3.4 - 10.8 x10E3/uL   RBC 4.37 3.77 - 5.28 x10E6/uL   Hemoglobin 12.4 11.1 - 15.9 g/dL   Hematocrit 11.9 14.7 - 46.6 %   MCV 88 79 - 97 fL   MCH 28.4 26.6 - 33.0 pg   MCHC 32.2 31.5 - 35.7 g/dL   RDW 82.9 56.2 - 13.0 %   Platelets 231 150 - 450 x10E3/uL   Neutrophils 58 Not Estab. %   Lymphs 25 Not Estab. %   Monocytes 7 Not Estab. %   Eos 9 Not Estab. %   Basos 0 Not Estab. %   Neutrophils Absolute 3.4 1.4 - 7.0 x10E3/uL   Lymphocytes Absolute 1.5 0.7 - 3.1 x10E3/uL   Monocytes Absolute 0.4 0.1 - 0.9 x10E3/uL   EOS (ABSOLUTE) 0.5 (H) 0.0 - 0.4 x10E3/uL   Basophils Absolute 0.0 0.0 - 0.2 x10E3/uL   Immature Granulocytes 1 Not Estab. %   Immature Grans (Abs) 0.0 0.0 - 0.1 x10E3/uL      Assessment & Plan:   Problem List Items Addressed This Visit   None      Follow up plan: No follow-ups on file.

## 2023-05-27 ENCOUNTER — Ambulatory Visit: Admitting: Nurse Practitioner

## 2023-05-27 ENCOUNTER — Encounter: Payer: Self-pay | Admitting: Nurse Practitioner

## 2023-05-27 NOTE — Progress Notes (Deleted)
   There were no vitals taken for this visit.   Subjective:    Patient ID: Monica Long, female    DOB: [birth year], 53 y.o.   MRN: [id number]  HPI: Monica Long is a 53 y.o. female  No chief complaint on file.  URINARY SYMPTOMS  Dysuria: {Blank single:19197::"yes","no","burning"} Urinary frequency: {Blank single:19197::"yes","no"} Urgency: {Blank single:19197::"yes","no"} Small volume voids: {Blank single:19197::"yes","no"} Symptom severity: {Blank single:19197::"yes","no"} Urinary incontinence: {Blank single:19197::"yes","no"} Foul odor: {Blank single:19197::"yes","no"} Hematuria: {Blank single:19197::"yes","no"} Abdominal pain: {Blank single:19197::"yes","no"} Back pain: {Blank single:19197::"yes","no"} Suprapubic pain/pressure: {Blank single:19197::"yes","no"} Flank pain: {Blank single:19197::"yes","no"} Fever:  {Blank multiple:19196::"yes","no","subjective","low grade"} Vomiting: {Blank single:19197::"yes","no"} Relief with cranberry juice: {Blank single:19197::"yes","no"} Relief with pyridium: {Blank single:19197::"yes","no"} Status: better/worse/stable Previous urinary tract infection: {Blank single:19197::"yes","no"} Recurrent urinary tract infection: {Blank single:19197::"yes","no"} Sexual activity: No sexually active/monogomous/practicing safe sex History of sexually transmitted disease: {Blank single:19197::"yes","no"} Penile discharge: {Blank single:19197::"yes","no"} Treatments attempted: {Blank multiple:19196::"none","antibiotics","pyridium","cranberry","increasing fluids"}   Relevant past medical, surgical, family and social history reviewed and updated as indicated. Interim medical history since our last visit reviewed. Allergies and medications reviewed and updated.  Review of Systems  Per HPI unless specifically indicated above     Objective:    There were no vitals taken for this visit.  Wt Readings from Last 3 Encounters:  04/05/23 135 lb 6.4  oz (61.4 kg)  03/26/23 135 lb (61.2 kg)  03/21/23 135 lb 9.6 oz (61.5 kg)    Physical Exam  Results for orders placed or performed in visit on 04/05/23  Basic Metabolic Panel (BMET)   Collection Time: 04/05/23  9:58 AM  Result Value Ref Range   Glucose 92 70 - 99 mg/dL   BUN 15 6 - 24 mg/dL   Creatinine, Ser 5.28 (H) 0.57 - 1.00 mg/dL   eGFR 67 >41 LK/GMW/1.02   BUN/Creatinine Ratio 15 9 - 23   Sodium 140 134 - 144 mmol/L   Potassium 4.1 3.5 - 5.2 mmol/L   Chloride 101 96 - 106 mmol/L   CO2 23 20 - 29 mmol/L   Calcium 9.5 8.7 - 10.2 mg/dL  CBC w/Diff   Collection Time: 04/05/23  9:58 AM  Result Value Ref Range   WBC 5.9 3.4 - 10.8 x10E3/uL   RBC 4.37 3.77 - 5.28 x10E6/uL   Hemoglobin 12.4 11.1 - 15.9 g/dL   Hematocrit 72.5 36.6 - 46.6 %   MCV 88 79 - 97 fL   MCH 28.4 26.6 - 33.0 pg   MCHC 32.2 31.5 - 35.7 g/dL   RDW 44.0 34.7 - 42.5 %   Platelets 231 150 - 450 x10E3/uL   Neutrophils 58 Not Estab. %   Lymphs 25 Not Estab. %   Monocytes 7 Not Estab. %   Eos 9 Not Estab. %   Basos 0 Not Estab. %   Neutrophils Absolute 3.4 1.4 - 7.0 x10E3/uL   Lymphocytes Absolute 1.5 0.7 - 3.1 x10E3/uL   Monocytes Absolute 0.4 0.1 - 0.9 x10E3/uL   EOS (ABSOLUTE) 0.5 (H) 0.0 - 0.4 x10E3/uL   Basophils Absolute 0.0 0.0 - 0.2 x10E3/uL   Immature Granulocytes 1 Not Estab. %   Immature Grans (Abs) 0.0 0.0 - 0.1 x10E3/uL      Assessment & Plan:   Problem List Items Addressed This Visit   None    Follow up plan: No follow-ups on file.

## 2023-06-24 ENCOUNTER — Other Ambulatory Visit: Payer: Self-pay | Admitting: Nurse Practitioner

## 2023-06-24 DIAGNOSIS — F32A Depression, unspecified: Secondary | ICD-10-CM

## 2023-06-25 NOTE — Telephone Encounter (Signed)
 Requested medications are due for refill today.  yes  Requested medications are on the active medications list.  yes  Last refill. 02/11/2023 #90 1 rf for both  Future visit scheduled.   yes  Notes to clinic.  Labs are expired.    Requested Prescriptions  Pending Prescriptions Disp Refills   buPROPion (WELLBUTRIN XL) 150 MG 24 hr tablet [Pharmacy Med Name: buPROPion HCl ER (XL) 150 MG Oral Tablet Extended Release 24 Hour] 90 tablet 3    Sig: TAKE 1 TABLET BY MOUTH DAILY  WITH 300MG  TABLET     Psychiatry: Antidepressants - bupropion Failed - 06/25/2023  3:53 PM      Failed - Cr in normal range and within 360 days    Creatinine, Ser  Date Value Ref Range Status  04/05/2023 1.01 (H) 0.57 - 1.00 mg/dL Final         Failed - AST in normal range and within 360 days    AST  Date Value Ref Range Status  08/10/2021 11 0 - 40 IU/L Final         Failed - ALT in normal range and within 360 days    ALT  Date Value Ref Range Status  08/10/2021 8 0 - 32 IU/L Final         Failed - Valid encounter within last 6 months    Recent Outpatient Visits   None            Passed - Completed PHQ-2 or PHQ-9 in the last 360 days      Passed - Last BP in normal range    BP Readings from Last 1 Encounters:  04/05/23 95/60          buPROPion (WELLBUTRIN XL) 300 MG 24 hr tablet [Pharmacy Med Name: buPROPion HCl ER (XL) 300 MG Oral Tablet Extended Release 24 Hour] 90 tablet 3    Sig: TAKE 1 TABLET BY MOUTH DAILY     Psychiatry: Antidepressants - bupropion Failed - 06/25/2023  3:53 PM      Failed - Cr in normal range and within 360 days    Creatinine, Ser  Date Value Ref Range Status  04/05/2023 1.01 (H) 0.57 - 1.00 mg/dL Final         Failed - AST in normal range and within 360 days    AST  Date Value Ref Range Status  08/10/2021 11 0 - 40 IU/L Final         Failed - ALT in normal range and within 360 days    ALT  Date Value Ref Range Status  08/10/2021 8 0 - 32 IU/L Final          Failed - Valid encounter within last 6 months    Recent Outpatient Visits   None            Passed - Completed PHQ-2 or PHQ-9 in the last 360 days      Passed - Last BP in normal range    BP Readings from Last 1 Encounters:  04/05/23 95/60

## 2023-07-08 ENCOUNTER — Encounter: Payer: Self-pay | Admitting: Nurse Practitioner

## 2023-07-08 ENCOUNTER — Other Ambulatory Visit (HOSPITAL_COMMUNITY)
Admission: RE | Admit: 2023-07-08 | Discharge: 2023-07-08 | Disposition: A | Discharge status: Home / Self Care | Source: Ambulatory Visit | Attending: Nurse Practitioner | Admitting: Nurse Practitioner

## 2023-07-08 ENCOUNTER — Ambulatory Visit: Admitting: Nurse Practitioner

## 2023-07-08 VITALS — BP 109/70 | HR 75 | Temp 98.5°F | Resp 17 | Ht 67.68 in | Wt 140.4 lb

## 2023-07-08 DIAGNOSIS — Z0000 Encounter for general adult medical examination without abnormal findings: Secondary | ICD-10-CM | POA: Insufficient documentation

## 2023-07-08 DIAGNOSIS — F32A Depression, unspecified: Secondary | ICD-10-CM

## 2023-07-08 DIAGNOSIS — Z1231 Encounter for screening mammogram for malignant neoplasm of breast: Secondary | ICD-10-CM | POA: Diagnosis not present

## 2023-07-08 DIAGNOSIS — J00 Acute nasopharyngitis [common cold]: Secondary | ICD-10-CM | POA: Diagnosis not present

## 2023-07-08 DIAGNOSIS — G43719 Chronic migraine without aura, intractable, without status migrainosus: Secondary | ICD-10-CM

## 2023-07-08 DIAGNOSIS — Z23 Encounter for immunization: Secondary | ICD-10-CM | POA: Diagnosis not present

## 2023-07-08 MED ORDER — BUSPIRONE HCL 15 MG PO TABS: 15.0000 mg | ORAL_TABLET | Freq: Three times a day (TID) | ORAL | 1 refills | Status: DC

## 2023-07-08 MED ORDER — BUPROPION HCL ER (XL) 150 MG PO TB24: 150.0000 mg | ORAL_TABLET | Freq: Every day | ORAL | 1 refills | Status: DC

## 2023-07-08 MED ORDER — FLUTICASONE PROPIONATE 50 MCG/ACT NA SUSP: NASAL | 1 refills | Status: AC

## 2023-07-08 MED ORDER — BUPROPION HCL ER (XL) 300 MG PO TB24: 300.0000 mg | ORAL_TABLET | Freq: Every day | ORAL | 1 refills | Status: DC

## 2023-07-08 NOTE — Assessment & Plan Note (Signed)
 Chronic.  Controlled.  Will increase Wellbutrin  to 450mg  daily.  Continue with PRN use of Buspar .  If still not well controlled can consider adding SSRI- has tried Zoloft  and Lexapro  in the past which she did not tolerate.  Can consider Celexa.   Follow up in 6 months.  Call sooner if concerns arise.

## 2023-07-08 NOTE — Progress Notes (Signed)
 BP 109/70 (BP Location: Left Arm, Patient Position: Sitting, Cuff Size: Normal)   Pulse 75   Temp 98.5 F (36.9 C) (Oral)   Resp 17   Ht 5' 7.68" (1.719 m)   Wt 140 lb 6.4 oz (63.7 kg)   SpO2 98%   BMI 21.55 kg/m    Subjective:    Patient ID: Monica Long, female    DOB: 09/03/1970, 53 y.o.   MRN: [id number]  HPI: Monica Long is a 53 y.o. female presenting on 07/08/2023 for comprehensive medical examination. Current medical complaints include: allergy symptoms  She currently lives with: Menopausal Symptoms: no  MIGRAINES Patient states she feels like her migraines have gotten better.  She is using the Fioricet PRN and Topamax  100mg  BID.  MOOD Patient states her mood has been better.  She is doing well with the Wellbutrin  450mg  daily.  Buspar  is used PRN and feels like this is working well.    Depression Screen done today and results listed below:     03/21/2023    2:38 PM 02/11/2023    3:55 PM 09/12/2022    2:28 PM 08/14/2022   11:19 AM 08/09/2022    3:04 PM  Depression screen PHQ 2/9  Decreased Interest 0 0 2 0 0  Down, Depressed, Hopeless 0 0 3 0 0  PHQ - 2 Score 0 0 5 0 0  Altered sleeping 0 0 0 0 0  Tired, decreased energy 3 3 1  0 0  Change in appetite 0 0 0 0 0  Feeling bad or failure about yourself  0 0 0 0 0  Trouble concentrating 0 0 0 0 0  Moving slowly or fidgety/restless 0 0 0 0 0  Suicidal thoughts 0 0 0 0 0  PHQ-9 Score 3 3 6  0 0  Difficult doing work/chores   Extremely dIfficult Not difficult at all     The patient does not have a history of falls. I did complete a risk assessment for falls. A plan of care for falls was documented.   Past Medical History:  Past Medical History:  Diagnosis Date   Anxiety    panic attack   Depression 02/08/2014   Hepatic steatosis    History of mammogram 01/07/2014; 05-09-15   birad 2; neg   History of Papanicolaou smear of cervix 01/07/2014   -/-   Hypercholesteremia    Hypertriglyceridemia    Kidney  stone 02/2015   Peripheral vascular disease [facility])     Surgical History:  Past Surgical History:  Procedure Laterality Date   CESAREAN SECTION  05/08/2001   x1   COLONOSCOPY WITH PROPOFOL  N/A 02/24/2021   Procedure: COLONOSCOPY WITH PROPOFOL ;  Surgeon: Marnee Sink, MD;  Location: [facility];  Service: Endoscopy;  Laterality: N/A;    Medications:  Current Outpatient Medications on File Prior to Visit  Medication Sig   butalbital -acetaminophen -caffeine  (FIORICET) 50-325-40 MG tablet Take 1 tablet by mouth every 6 (six) hours as needed for headache.   drospirenone -ethinyl estradiol  (LO-ZUMANDIMINE ) 3-0.02 MG tablet TAKE 1 TABLET BY MOUTH DAILY   Omega-3 1000 MG CAPS Take 1,000 mg by mouth daily.   topiramate  (TOPAMAX ) 100 MG tablet Take 1 tablet (100 mg total) by mouth 2 (two) times daily.   azelastine  (ASTELIN ) 0.1 % nasal spray PLACE 1 SPRAY INTO BOTH NOSTRILS 2 (TWO) TIMES DAILY. USE IN EACH NOSTRIL AS DIRECTED   [DISCONTINUED] Azelastine -Fluticasone  137-50 MCG/ACT SUSP Place 1 spray into the nose 2 (two) times daily. (  Patient not taking: Reported on 04/05/2023)   [DISCONTINUED] hydrochlorothiazide  (MICROZIDE ) 12.5 MG capsule Take 1 capsule (12.5 mg total) by mouth daily.   [DISCONTINUED] sertraline  (ZOLOFT ) 50 MG tablet TAKE 1 TABLET BY MOUTH EVERY DAY   No current facility-administered medications on file prior to visit.    Allergies:  Allergies  Allergen Reactions   Amoxicillin      Severe vomiting     Social History:  Social History   Socioeconomic History   Marital status: Single    Spouse name: Not on file   Number of children: 1   Years of education: 12   Highest education level: Some college, no degree  Occupational History    Employer: [employer]  Tobacco Use   Smoking status: Never   Smokeless tobacco: Never  Vaping Use   Vaping status: Never Used  Substance and Sexual Activity   Alcohol use: No    Alcohol/week: 0.0 standard drinks of alcohol    Drug use: No   Sexual activity: Yes    Birth control/protection: Pill  Other Topics Concern   Not on file  Social History Narrative   Not on file   Social Drivers of Health   Financial Resource Strain: Low Risk  (03/20/2023)   Overall Financial Resource Strain (CARDIA)    Difficulty of Paying Living Expenses: Not very hard  Recent Concern: Financial Resource Strain - Medium Risk (02/08/2023)   Overall Financial Resource Strain (CARDIA)    Difficulty of Paying Living Expenses: Somewhat hard  Food Insecurity: Food Insecurity Present (03/20/2023)   Hunger Vital Sign    Worried About Running Out of Food in the Last Year: Sometimes true    Ran Out of Food in the Last Year: Never true  Transportation Needs: No Transportation Needs (03/20/2023)   PRAPARE - Administrator, Civil Service (Medical): No    Lack of Transportation (Non-Medical): No  Recent Concern: Transportation Needs - Unmet Transportation Needs (02/08/2023)   PRAPARE - Transportation    Lack of Transportation (Medical): Yes    Lack of Transportation (Non-Medical): Yes  Physical Activity: Insufficiently Active (03/20/2023)   Exercise Vital Sign    Days of Exercise per Week: 2 days    Minutes of Exercise per Session: 20 min  Stress: No Stress Concern Present (03/20/2023)   [employer] of Occupational Health - Occupational Stress Questionnaire    Feeling of Stress : Not at all  Recent Concern: Stress - Stress Concern Present (02/08/2023)   [employer] of Occupational Health - Occupational Stress Questionnaire    Feeling of Stress : Rather much  Social Connections: Moderately Isolated (03/20/2023)   Social Connection and Isolation Panel [NHANES]    Frequency of Communication with Friends and Family: Three times a week    Frequency of Social Gatherings with Friends and Family: Twice a week    Attends Religious Services: 1 to 4 times per year    Active Member of [employer] or Organizations: No    Attends Museum/gallery exhibitions officer: Not on file    Marital Status: Never married  Catering manager Violence: Not on file   Social History   Tobacco Use  Smoking Status Never  Smokeless Tobacco Never   Social History   Substance and Sexual Activity  Alcohol Use No   Alcohol/week: 0.0 standard drinks of alcohol    Family History:  Family History  Problem Relation Age of Onset   Healthy Mother    Hypertension Father  Colon cancer Maternal Grandmother 3    Past medical history, surgical history, medications, allergies, family history and social history reviewed with patient today and changes made to appropriate areas of the chart.   Review of Systems  Neurological:  Positive for headaches.  Psychiatric/Behavioral:  Positive for depression. The patient is nervous/anxious.    All other ROS negative except what is listed above and in the HPI.      Objective:    BP 109/70 (BP Location: Left Arm, Patient Position: Sitting, Cuff Size: Normal)   Pulse 75   Temp 98.5 F (36.9 C) (Oral)   Resp 17   Ht 5' 7.68" (1.719 m)   Wt 140 lb 6.4 oz (63.7 kg)   SpO2 98%   BMI 21.55 kg/m   Wt Readings from Last 3 Encounters:  07/08/23 140 lb 6.4 oz (63.7 kg)  04/05/23 135 lb 6.4 oz (61.4 kg)  03/26/23 135 lb (61.2 kg)    Physical Exam Vitals and nursing note reviewed. Exam conducted with a chaperone present (Katie Lumpkins, CMA).  Constitutional:      General: She is awake. She is not in acute distress.    Appearance: Normal appearance. She is well-developed. She is not ill-appearing.  HENT:     Head: Normocephalic and atraumatic.     Right Ear: Hearing, tympanic membrane, ear canal and external ear normal. No drainage.     Left Ear: Hearing, tympanic membrane, ear canal and external ear normal. No drainage.     Nose: Nose normal.     Right Sinus: No maxillary sinus tenderness or frontal sinus tenderness.     Left Sinus: No maxillary sinus tenderness or frontal sinus tenderness.      Mouth/Throat:     Mouth: Mucous membranes are moist.     Pharynx: Oropharynx is clear. Uvula midline. No pharyngeal swelling, oropharyngeal exudate or posterior oropharyngeal erythema.  Eyes:     General: Lids are normal.        Right eye: No discharge.        Left eye: No discharge.     Extraocular Movements: Extraocular movements intact.     Conjunctiva/sclera: Conjunctivae normal.     Pupils: Pupils are equal, round, and reactive to light.     Visual Fields: Right eye visual fields normal and left eye visual fields normal.  Neck:     Thyroid: No thyromegaly.     Vascular: No carotid bruit.     Trachea: Trachea normal.  Cardiovascular:     Rate and Rhythm: Normal rate and regular rhythm.     Heart sounds: Normal heart sounds. No murmur heard.    No gallop.  Pulmonary:     Effort: Pulmonary effort is normal. No accessory muscle usage or respiratory distress.     Breath sounds: Normal breath sounds.  Chest:  Breasts:    Right: Normal.     Left: Normal.  Abdominal:     General: Bowel sounds are normal.     Palpations: Abdomen is soft. There is no hepatomegaly or splenomegaly.     Tenderness: There is no abdominal tenderness.  Genitourinary:    Vagina: Normal.     Cervix: Normal.     Adnexa: Right adnexa normal and left adnexa normal.  Musculoskeletal:        General: Normal range of motion.     Cervical back: Normal range of motion and neck supple.     Right lower leg: No edema.     Left  lower leg: No edema.  Lymphadenopathy:     Head:     Right side of head: No submental, submandibular, tonsillar, preauricular or posterior auricular adenopathy.     Left side of head: No submental, submandibular, tonsillar, preauricular or posterior auricular adenopathy.     Cervical: No cervical adenopathy.     Upper Body:     Right upper body: No supraclavicular, axillary or pectoral adenopathy.     Left upper body: No supraclavicular, axillary or pectoral adenopathy.  Skin:     General: Skin is warm and dry.     Capillary Refill: Capillary refill takes less than 2 seconds.     Findings: No rash.  Neurological:     Mental Status: She is alert and oriented to person, place, and time.     Gait: Gait is intact.  Psychiatric:        Attention and Perception: Attention normal.        Mood and Affect: Mood normal.        Speech: Speech normal.        Behavior: Behavior normal. Behavior is cooperative.        Thought Content: Thought content normal.        Judgment: Judgment normal.     Results for orders placed or performed in visit on 04/05/23  Basic Metabolic Panel (BMET)   Collection Time: 04/05/23  9:58 AM  Result Value Ref Range   Glucose 92 70 - 99 mg/dL   BUN 15 6 - 24 mg/dL   Creatinine, Ser 1.61 (H) 0.57 - 1.00 mg/dL   eGFR 67 >09 UE/AVW/0.98   BUN/Creatinine Ratio 15 9 - 23   Sodium 140 134 - 144 mmol/L   Potassium 4.1 3.5 - 5.2 mmol/L   Chloride 101 96 - 106 mmol/L   CO2 23 20 - 29 mmol/L   Calcium 9.5 8.7 - 10.2 mg/dL  CBC w/Diff   Collection Time: 04/05/23  9:58 AM  Result Value Ref Range   WBC 5.9 3.4 - 10.8 x10E3/uL   RBC 4.37 3.77 - 5.28 x10E6/uL   Hemoglobin 12.4 11.1 - 15.9 g/dL   Hematocrit 11.9 14.7 - 46.6 %   MCV 88 79 - 97 fL   MCH 28.4 26.6 - 33.0 pg   MCHC 32.2 31.5 - 35.7 g/dL   RDW 82.9 56.2 - 13.0 %   Platelets 231 150 - 450 x10E3/uL   Neutrophils 58 Not Estab. %   Lymphs 25 Not Estab. %   Monocytes 7 Not Estab. %   Eos 9 Not Estab. %   Basos 0 Not Estab. %   Neutrophils Absolute 3.4 1.4 - 7.0 x10E3/uL   Lymphocytes Absolute 1.5 0.7 - 3.1 x10E3/uL   Monocytes Absolute 0.4 0.1 - 0.9 x10E3/uL   EOS (ABSOLUTE) 0.5 (H) 0.0 - 0.4 x10E3/uL   Basophils Absolute 0.0 0.0 - 0.2 x10E3/uL   Immature Granulocytes 1 Not Estab. %   Immature Grans (Abs) 0.0 0.0 - 0.1 x10E3/uL      Assessment & Plan:   Problem List Items Addressed This Visit       Cardiovascular and Mediastinum   Intractable chronic migraine without aura and  without status migrainosus   Chronic.  Controlled.  Continue with Topamax  to 100mg  BID.  If not improved, can consider adding maintenance medication such as Aimovig.  Fioticet still working well for break through pain.  Follow up in 6 months.  Call sooner if concerns arise.  Relevant Medications   buPROPion  (WELLBUTRIN  XL) 150 MG 24 hr tablet   buPROPion  (WELLBUTRIN  XL) 300 MG 24 hr tablet     Other   Depression   Chronic.  Controlled.  Will increase Wellbutrin  to 450mg  daily.  Continue with PRN use of Buspar .  If still not well controlled can consider adding SSRI- has tried Zoloft  and Lexapro  in the past which she did not tolerate.  Can consider Celexa.   Follow up in 6 months.  Call sooner if concerns arise.       Relevant Medications   buPROPion  (WELLBUTRIN  XL) 150 MG 24 hr tablet   buPROPion  (WELLBUTRIN  XL) 300 MG 24 hr tablet   busPIRone  (BUSPAR ) 15 MG tablet   Other Visit Diagnoses       Well adult exam    -  Primary   Health maintenance reviewed during visit today.  Labs ordered.  Vaccines reviewed.  PAP repeated.  Mammogram ordered.  Shingrix  given.   Relevant Orders   CBC with Differential/Platelet   Comprehensive metabolic panel with GFR   Lipid panel   TSH   Urinalysis, Routine w reflex microscopic   Cytology - PAP     Acute rhinitis       Relevant Medications   fluticasone  (FLONASE ) 50 MCG/ACT nasal spray     Encounter for screening mammogram for malignant neoplasm of breast       Relevant Orders   MM 3D SCREENING MAMMOGRAM BILATERAL BREAST     Need for shingles vaccine       Relevant Orders   Zoster Recombinant (Shingrix  )        Follow up plan: No follow-ups on file.   LABORATORY TESTING:  - Pap smear: pap done  IMMUNIZATIONS:   - Tdap: Tetanus vaccination status reviewed: last tetanus booster within 10 years. - Influenza: Postponed to flu season - Pneumovax: Not applicable - Prevnar: Not applicable - COVID: Not applicable - HPV: Not  applicable - Shingrix  vaccine: Administered today  SCREENING: -Mammogram: Ordered today  - Colonoscopy: Not applicable  - Bone Density: Not applicable  -Hearing Test: Not applicable  -Spirometry: Not applicable   PATIENT COUNSELING:   Advised to take 1 mg of folate supplement per day if capable of pregnancy.   Sexuality: Discussed sexually transmitted diseases, partner selection, use of condoms, avoidance of unintended pregnancy  and contraceptive alternatives.   Advised to avoid cigarette smoking.  I discussed with the patient that most people either abstain from alcohol or drink within safe limits (<=14/week and <=4 drinks/occasion for males, <=7/weeks and <= 3 drinks/occasion for females) and that the risk for alcohol disorders and other health effects rises proportionally with the number of drinks per week and how often a drinker exceeds daily limits.  Discussed cessation/primary prevention of drug use and availability of treatment for abuse.   Diet: Encouraged to adjust caloric intake to maintain  or achieve ideal body weight, to reduce intake of dietary saturated fat and total fat, to limit sodium intake by avoiding high sodium foods and not adding table salt, and to maintain adequate dietary potassium and calcium preferably from fresh fruits, vegetables, and low-fat dairy products.    stressed the importance of regular exercise  Injury prevention: Discussed safety belts, safety helmets, smoke detector, smoking near bedding or upholstery.   Dental health: Discussed importance of regular tooth brushing, flossing, and dental visits.    NEXT PREVENTATIVE PHYSICAL DUE IN 1 YEAR. No follow-ups on file.

## 2023-07-08 NOTE — Assessment & Plan Note (Signed)
 Chronic.  Controlled.  Continue with Topamax  to 100mg  BID.  If not improved, can consider adding maintenance medication such as Aimovig.  Fioticet still working well for break through pain.  Follow up in 6 months.  Call sooner if concerns arise.

## 2023-07-08 NOTE — Patient Instructions (Signed)
 You have an order for:  []   2D Mammogram  [x]   3D Mammogram  []   Bone Density     Please call for appointment:  [facility]  [address] [place] [address] [phone] [facility] [address] [place], [place] [address] [phone] [facility] [address] Geanie Logan [place], [place] [address] [phone]   Make sure to wear two-piece clothing.  No lotions, powders, or deodorants the day of the appointment. Make sure to bring picture ID and insurance card.  Bring list of medications you are currently taking including any supplements.   Schedule your [facility] screening mammogram through MyChart!   Log into your MyChart account.  Go to 'Visit' (or 'Appointments' if on mobile App) --> Schedule an Appointment  Under 'Select a Reason for Visit' choose the Mammogram Screening option.  Complete the pre-visit questions and select the time and place that best fits your schedule.

## 2023-07-12 ENCOUNTER — Encounter: Payer: Self-pay | Admitting: Nurse Practitioner

## 2023-07-12 LAB — CYTOLOGY - PAP: Diagnosis: NEGATIVE

## 2023-07-15 ENCOUNTER — Encounter: Payer: Self-pay | Admitting: Nurse Practitioner

## 2023-07-16 ENCOUNTER — Other Ambulatory Visit: Payer: Self-pay | Admitting: Nurse Practitioner

## 2023-07-19 ENCOUNTER — Encounter: Payer: Self-pay | Admitting: Nurse Practitioner

## 2023-07-19 NOTE — Telephone Encounter (Signed)
 Requested medication (s) are due for refill today: yes  Requested medication (s) are on the active medication list: yes  Last refill:  02/11/23 #180 1 RF  Future visit scheduled: yes  Notes to clinic:  overdue lab work   Requested Prescriptions  Pending Prescriptions Disp Refills   topiramate  (TOPAMAX ) 100 MG tablet [Pharmacy Med Name: Topiramate  100 MG Oral Tablet] 180 tablet 3    Sig: TAKE 1 TABLET BY MOUTH TWICE  DAILY     Neurology: Anticonvulsants - topiramate  & zonisamide Failed - 07/19/2023  1:44 PM      Failed - Cr in normal range and within 360 days    Creatinine, Ser  Date Value Ref Range Status  04/05/2023 1.01 (H) 0.57 - 1.00 mg/dL Final         Failed - ALT in normal range and within 360 days    ALT  Date Value Ref Range Status  08/10/2021 8 0 - 32 IU/L Final         Failed - AST in normal range and within 360 days    AST  Date Value Ref Range Status  08/10/2021 11 0 - 40 IU/L Final         Failed - Valid encounter within last 12 months    Recent Outpatient Visits           1 week ago Well adult exam   [place] [facility] Aileen Alexanders, NP              Passed - CO2 in normal range and within 360 days    CO2  Date Value Ref Range Status  04/05/2023 23 20 - 29 mmol/L Final         Passed - Completed PHQ-2 or PHQ-9 in the last 360 days

## 2023-08-06 ENCOUNTER — Encounter (INDEPENDENT_AMBULATORY_CARE_PROVIDER_SITE_OTHER): Payer: Self-pay

## 2023-08-07 ENCOUNTER — Ambulatory Visit
Admission: RE | Admit: 2023-08-07 | Discharge: 2023-08-07 | Disposition: A | Discharge status: Home / Self Care | Source: Ambulatory Visit | Attending: Nurse Practitioner | Admitting: Nurse Practitioner

## 2023-08-07 DIAGNOSIS — Z1231 Encounter for screening mammogram for malignant neoplasm of breast: Secondary | ICD-10-CM | POA: Insufficient documentation

## 2023-08-20 ENCOUNTER — Telehealth: Payer: Self-pay | Admitting: Nurse Practitioner

## 2023-08-20 ENCOUNTER — Encounter: Payer: Self-pay | Admitting: Nurse Practitioner

## 2023-08-20 NOTE — Telephone Encounter (Signed)
 Patient came to drop off FMLA paperwork. They were placed in Karen's folder. Please call her when completed Akeyla [phone]

## 2023-08-21 NOTE — Telephone Encounter (Signed)
 See mychart messages with the patient.

## 2023-08-21 NOTE — Telephone Encounter (Signed)
 Paperwork received. Will start and then give to provider to complete and sign.

## 2023-10-16 ENCOUNTER — Encounter: Payer: Self-pay | Admitting: Nurse Practitioner

## 2023-11-06 ENCOUNTER — Ambulatory Visit: Admitting: Nurse Practitioner

## 2023-11-18 ENCOUNTER — Other Ambulatory Visit: Payer: Self-pay | Admitting: Nurse Practitioner

## 2023-11-18 DIAGNOSIS — F32A Depression, unspecified: Secondary | ICD-10-CM

## 2023-11-19 NOTE — Telephone Encounter (Signed)
 Rx- both 07/08/23 #90 1RF- 6 month RF-too soon Requested Prescriptions  Pending Prescriptions Disp Refills   buPROPion  (WELLBUTRIN  XL) 300 MG 24 hr tablet [Pharmacy Med Name: buPROPion  HCl ER (XL) 300 MG Oral Tablet Extended Release 24 Hour] 90 tablet 3    Sig: TAKE 1 TABLET BY MOUTH DAILY     Psychiatry: Antidepressants - bupropion  Failed - 11/19/2023  3:20 PM      Failed - Cr in normal range and within 360 days    Creatinine, Ser  Date Value Ref Range Status  04/05/2023 1.01 (H) 0.57 - 1.00 mg/dL Final         Failed - AST in normal range and within 360 days    AST  Date Value Ref Range Status  08/10/2021 11 0 - 40 IU/L Final         Failed - ALT in normal range and within 360 days    ALT  Date Value Ref Range Status  08/10/2021 8 0 - 32 IU/L Final         Passed - Completed PHQ-2 or PHQ-9 in the last 360 days      Passed - Last BP in normal range    BP Readings from Last 1 Encounters:  07/08/23 109/70         Passed - Valid encounter within last 6 months    Recent Outpatient Visits           4 months ago Well adult exam   [place] [facility] Melvin Pao, NP               buPROPion  (WELLBUTRIN  XL) 150 MG 24 hr tablet [Pharmacy Med Name: buPROPion  HCl ER (XL) 150 MG Oral Tablet Extended Release 24 Hour] 90 tablet 3    Sig: TAKE 1 TABLET BY MOUTH DAILY  WITH 300MG  TABLET     Psychiatry: Antidepressants - bupropion  Failed - 11/19/2023  3:20 PM      Failed - Cr in normal range and within 360 days    Creatinine, Ser  Date Value Ref Range Status  04/05/2023 1.01 (H) 0.57 - 1.00 mg/dL Final         Failed - AST in normal range and within 360 days    AST  Date Value Ref Range Status  08/10/2021 11 0 - 40 IU/L Final         Failed - ALT in normal range and within 360 days    ALT  Date Value Ref Range Status  08/10/2021 8 0 - 32 IU/L Final         Passed - Completed PHQ-2 or PHQ-9 in the last 360 days      Passed - Last BP in normal  range    BP Readings from Last 1 Encounters:  07/08/23 109/70         Passed - Valid encounter within last 6 months    Recent Outpatient Visits           4 months ago Well adult exam   [place] [facility] Melvin Pao, NP

## 2023-12-06 ENCOUNTER — Encounter: Payer: Self-pay | Admitting: Nurse Practitioner

## 2023-12-09 ENCOUNTER — Other Ambulatory Visit: Payer: Self-pay | Admitting: Nurse Practitioner

## 2023-12-10 NOTE — Telephone Encounter (Signed)
 Requested Prescriptions  Pending Prescriptions Disp Refills   topiramate  (TOPAMAX ) 100 MG tablet [Pharmacy Med Name: Topiramate  100 MG Oral Tablet] 180 tablet 3    Sig: TAKE 1 TABLET BY MOUTH TWICE  DAILY     Neurology: Anticonvulsants - topiramate  & zonisamide Failed - 12/10/2023  4:18 PM      Failed - Cr in normal range and within 360 days    Creatinine, Ser  Date Value Ref Range Status  04/05/2023 1.01 (H) 0.57 - 1.00 mg/dL Final         Failed - ALT in normal range and within 360 days    ALT  Date Value Ref Range Status  08/10/2021 8 0 - 32 IU/L Final         Failed - AST in normal range and within 360 days    AST  Date Value Ref Range Status  08/10/2021 11 0 - 40 IU/L Final         Passed - CO2 in normal range and within 360 days    CO2  Date Value Ref Range Status  04/05/2023 23 20 - 29 mmol/L Final         Passed - Completed PHQ-2 or PHQ-9 in the last 360 days      Passed - Valid encounter within last 12 months    Recent Outpatient Visits           5 months ago Well adult exam   [place] [facility] Melvin Pao, NP

## 2024-01-07 ENCOUNTER — Ambulatory Visit: Admitting: Nurse Practitioner

## 2024-01-07 ENCOUNTER — Encounter: Payer: Self-pay | Admitting: Nurse Practitioner

## 2024-01-07 VITALS — BP 108/70 | HR 77 | Temp 98.2°F | Ht 67.6 in | Wt 145.0 lb

## 2024-01-07 DIAGNOSIS — G43719 Chronic migraine without aura, intractable, without status migrainosus: Secondary | ICD-10-CM | POA: Diagnosis not present

## 2024-01-07 DIAGNOSIS — Z23 Encounter for immunization: Secondary | ICD-10-CM | POA: Diagnosis not present

## 2024-01-07 DIAGNOSIS — F419 Anxiety disorder, unspecified: Secondary | ICD-10-CM

## 2024-01-07 DIAGNOSIS — F32A Depression, unspecified: Secondary | ICD-10-CM | POA: Diagnosis not present

## 2024-01-07 DIAGNOSIS — I499 Cardiac arrhythmia, unspecified: Secondary | ICD-10-CM | POA: Diagnosis not present

## 2024-01-07 MED ORDER — BUPROPION HCL ER (XL) 300 MG PO TB24: 300.0000 mg | ORAL_TABLET | Freq: Every day | ORAL | 1 refills | Status: AC

## 2024-01-07 MED ORDER — TOPIRAMATE 100 MG PO TABS: 100.0000 mg | ORAL_TABLET | Freq: Two times a day (BID) | ORAL | 1 refills | Status: AC

## 2024-01-07 MED ORDER — DROSPIRENONE-ETHINYL ESTRADIOL 3-0.02 MG PO TABS: 1.0000 | ORAL_TABLET | Freq: Every day | ORAL | 3 refills | Status: AC

## 2024-01-07 MED ORDER — BUPROPION HCL ER (XL) 150 MG PO TB24: 150.0000 mg | ORAL_TABLET | Freq: Every day | ORAL | 1 refills | Status: AC

## 2024-01-07 MED ORDER — BUTALBITAL-APAP-CAFFEINE 50-325-40 MG PO TABS: 1.0000 | ORAL_TABLET | Freq: Four times a day (QID) | ORAL | 2 refills | Status: AC | PRN

## 2024-01-07 MED ORDER — BUSPIRONE HCL 15 MG PO TABS: 15.0000 mg | ORAL_TABLET | Freq: Three times a day (TID) | ORAL | 1 refills | Status: AC

## 2024-01-07 NOTE — Assessment & Plan Note (Addendum)
 Chronic.  Controlled.  Continue with current medication regimen of Wellbutrin  and PRN Buspar .  Refills sent today.  Labs ordered today.  Return to clinic in 6 months for reevaluation.  Call sooner if concerns arise.

## 2024-01-07 NOTE — Assessment & Plan Note (Signed)
 Chronic.  Controlled.  Continue with Topamax  to 100mg  BID.  If not improved, can consider adding maintenance medication such as Aimovig.  Fioticet still working well for break through pain.  Follow up in 6 months.  Call sooner if concerns arise.

## 2024-01-07 NOTE — Progress Notes (Signed)
 BP 108/70   Pulse 77   Temp 98.2 F (36.8 C) (Oral)   Ht 5' 7.6 (1.717 m)   Wt 145 lb (65.8 kg)   SpO2 98%   BMI 22.31 kg/m    Subjective:    Patient ID: Monica Long Drafts, female    DOB: 1970/11/21, 53 y.o.   MRN: [id number]  HPI: Monica Long is a 53 y.o. female  Chief Complaint  Patient presents with   Anxiety   Depression   Migraine   MIGRAINES Patient states she feels like her migraines have gotten better.  She is using the Fioricet PRN and Topamax  100mg  BID.  MOOD Patient states her mood has been better.  She is doing well with the Wellbutrin  450mg  daily.  Buspar  is used PRN and feels like this is working well.  She does feel like she is more moody with perimenopause.  She will need updated FMLA paperwork soon.     Relevant past medical, surgical, family and social history reviewed and updated as indicated. Interim medical history since our last visit reviewed. Allergies and medications reviewed and updated.  Review of Systems  Neurological:  Positive for headaches.  Psychiatric/Behavioral:  Positive for dysphoric mood. Negative for suicidal ideas. The patient is nervous/anxious.     Per HPI unless specifically indicated above     Objective:    BP 108/70   Pulse 77   Temp 98.2 F (36.8 C) (Oral)   Ht 5' 7.6 (1.717 m)   Wt 145 lb (65.8 kg)   SpO2 98%   BMI 22.31 kg/m   Wt Readings from Last 3 Encounters:  01/07/24 145 lb (65.8 kg)  07/08/23 140 lb 6.4 oz (63.7 kg)  04/05/23 135 lb 6.4 oz (61.4 kg)    Physical Exam Vitals and nursing note reviewed.  Constitutional:      General: She is not in acute distress.    Appearance: Normal appearance. She is normal weight. She is not ill-appearing, toxic-appearing or diaphoretic.  HENT:     Head: Normocephalic.     Right Ear: External ear normal.     Left Ear: External ear normal.     Nose: Nose normal.     Mouth/Throat:     Mouth: Mucous membranes are moist.     Pharynx: Oropharynx is clear.   Eyes:     General:        Right eye: No discharge.        Left eye: No discharge.     Extraocular Movements: Extraocular movements intact.     Conjunctiva/sclera: Conjunctivae normal.     Pupils: Pupils are equal, round, and reactive to light.  Cardiovascular:     Rate and Rhythm: Normal rate and regular rhythm.     Heart sounds: No murmur heard. Pulmonary:     Effort: Pulmonary effort is normal. No respiratory distress.     Breath sounds: Normal breath sounds. No wheezing or rales.  Musculoskeletal:     Cervical back: Normal range of motion and neck supple.  Skin:    General: Skin is warm and dry.     Capillary Refill: Capillary refill takes less than 2 seconds.  Neurological:     General: No focal deficit present.     Mental Status: She is alert and oriented to person, place, and time. Mental status is at baseline.  Psychiatric:        Mood and Affect: Mood normal.        Behavior:  Behavior normal.        Thought Content: Thought content normal.        Judgment: Judgment normal.     Results for orders placed or performed in visit on 07/08/23  Cytology - PAP   Collection Time: 07/08/23  3:15 PM  Result Value Ref Range   Adequacy      Satisfactory for evaluation; transformation zone component PRESENT.   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  Controlled.  Continue with Topamax  to 100mg  BID.  If not improved, can consider adding maintenance medication such as Aimovig.  Fioticet still working well for break through pain.  Follow up in 6 months.  Call sooner if concerns arise.       Relevant Medications   topiramate  (TOPAMAX ) 100 MG tablet   buPROPion  (WELLBUTRIN  XL) 150 MG 24 hr tablet   buPROPion  (WELLBUTRIN  XL) 300 MG 24 hr tablet    butalbital -acetaminophen -caffeine  (FIORICET) 50-325-40 MG tablet     Other   Anxiety   Chronic.  Controlled.  Continue with current medication regimen of Wellbutrin  and PRN Buspar .  Refills sent today.  Labs ordered today.  Return to clinic in 6 months for reevaluation.  Call sooner if concerns arise.       Relevant Medications   buPROPion  (WELLBUTRIN  XL) 150 MG 24 hr tablet   buPROPion  (WELLBUTRIN  XL) 300 MG 24 hr tablet   busPIRone  (BUSPAR ) 15 MG tablet   Depression   Chronic.  Controlled.  Continue with current medication regimen of Wellbutrin  and PRN Buspar .  Refills sent today.  Labs ordered today.  Return to clinic in 6 months for reevaluation.  Call sooner if concerns arise.        Relevant Medications   buPROPion  (WELLBUTRIN  XL) 150 MG 24 hr tablet   buPROPion  (WELLBUTRIN  XL) 300 MG 24 hr tablet   busPIRone  (BUSPAR ) 15 MG tablet   Other Visit Diagnoses       Irregular cardiac rhythm       EKG showed NSR.   Relevant Orders   EKG 12-Lead     Need for shingles vaccine       Relevant Orders   Zoster Recombinant (Shingrix  ) (Completed)        Follow up plan: No follow-ups on file.

## 2024-01-07 NOTE — Assessment & Plan Note (Signed)
 Chronic.  Controlled.  Continue with current medication regimen of Wellbutrin  and PRN Buspar .  Refills sent today.  Labs ordered today.  Return to clinic in 6 months for reevaluation.  Call sooner if concerns arise.

## 2024-01-07 NOTE — Patient Instructions (Signed)
 Ashwaganda

## 2024-02-19 ENCOUNTER — Encounter: Payer: Self-pay | Admitting: Nurse Practitioner

## 2024-02-19 ENCOUNTER — Telehealth: Payer: Self-pay | Admitting: Nurse Practitioner

## 2024-02-19 NOTE — Telephone Encounter (Signed)
 On this date 02/19/24  paperwork has been received on patient's behalf. The paperwork has been received patient brought into the office.. The paperwork that has been received is FMLA.   Paperwork to be returned via Fax.   Paperwork to be placed in providers incoming folder to be worked by their team. Jud is expected to be completed and returned 5-7 business days.

## 2024-02-20 NOTE — Telephone Encounter (Signed)
 Paperwork received. Will start and then give to provider to complete and sign.

## 2024-02-24 NOTE — Telephone Encounter (Signed)
 Forms placed in providers folder for completion and sign.

## 2024-02-26 ENCOUNTER — Telehealth: Payer: Self-pay | Admitting: Nurse Practitioner

## 2024-02-26 NOTE — Telephone Encounter (Signed)
 On this date 02/26/24  paperwork has been received on patient's behalf. The paperwork has been received patient brought into the office.. The paperwork that has been received is Short Term Disability (STD).   Paperwork to be returned via Fax.   Paperwork to be placed in providers incoming folder to be worked by their team. Jud is expected to be completed and returned 5-7 business days.

## 2024-02-27 NOTE — Telephone Encounter (Signed)
 Paperwork faxed in for the patient. Patient notified that this was done via [employer].

## 2024-02-27 NOTE — Telephone Encounter (Signed)
 Paperwork has been corrected and will be faxed back for the patient.

## 2024-03-23 ENCOUNTER — Encounter: Payer: Self-pay | Admitting: Nurse Practitioner

## 2024-03-23 ENCOUNTER — Ambulatory Visit: Payer: Self-pay | Admitting: Nurse Practitioner

## 2024-03-23 VITALS — BP 125/79 | HR 77 | Temp 98.4°F | Ht 67.6 in | Wt 147.0 lb

## 2024-03-23 DIAGNOSIS — R102 Pelvic and perineal pain unspecified side: Secondary | ICD-10-CM

## 2024-03-23 DIAGNOSIS — N760 Acute vaginitis: Secondary | ICD-10-CM

## 2024-03-23 DIAGNOSIS — R300 Dysuria: Secondary | ICD-10-CM | POA: Diagnosis not present

## 2024-03-23 DIAGNOSIS — B9689 Other specified bacterial agents as the cause of diseases classified elsewhere: Secondary | ICD-10-CM | POA: Diagnosis not present

## 2024-03-23 LAB — URINALYSIS, ROUTINE W REFLEX MICROSCOPIC
Bilirubin, UA: NEGATIVE
Glucose, UA: NEGATIVE
Ketones, UA: NEGATIVE
Leukocytes,UA: NEGATIVE
Nitrite, UA: NEGATIVE
Protein,UA: NEGATIVE
Specific Gravity, UA: 1.015 (ref 1.005–1.030)
Urobilinogen, Ur: 0.2 mg/dL (ref 0.2–1.0)
pH, UA: 7 (ref 5.0–7.5)

## 2024-03-23 LAB — WET PREP FOR TRICH, YEAST, CLUE
Clue Cell Exam: POSITIVE — AB
Trichomonas Exam: NEGATIVE
Yeast Exam: NEGATIVE

## 2024-03-23 LAB — MICROSCOPIC EXAMINATION: WBC, UA: NONE SEEN /HPF (ref 0–5)

## 2024-03-23 MED ORDER — METRONIDAZOLE 500 MG PO TABS: 500.0000 mg | ORAL_TABLET | Freq: Two times a day (BID) | ORAL | 0 refills | Status: AC

## 2024-03-23 MED ORDER — ONDANSETRON HCL 4 MG PO TABS: 4.0000 mg | ORAL_TABLET | Freq: Three times a day (TID) | ORAL | 0 refills | Status: AC | PRN

## 2024-03-23 NOTE — Progress Notes (Signed)
 "  BP 125/79 (BP Location: Left Arm, Patient Position: Sitting, Cuff Size: Normal)   Pulse 77   Temp 98.4 F (36.9 C) (Oral)   Ht 5' 7.6 (1.717 m)   Wt 147 lb (66.7 kg)   SpO2 100%   BMI 22.62 kg/m    Subjective:    Patient ID: Monica Long, female    DOB: [birth year], 54 y.o.   MRN: [id number]  HPI: Monica Long is a 54 y.o. female  Chief Complaint  Patient presents with   Urinary Tract Infection    Patient stated she went to urgent care and they tested her, so she doesn't have a UTI. There is pressure, and she is nauseous, she is urinating frequently, and back/side pain on the left.   URINARY SYMPTOMS Patient's symptoms have been going on for about 5 days. She was seen in UC yesterday and UTI was ruled out. Hasn't had a period since may.  Feels crampy and bloated.  Having some nausea with the symptoms. Dysuria: no Urinary frequency: yes Urgency: no Small volume voids: no Symptom severity: no Urinary incontinence: no Foul odor: yes Hematuria: no Abdominal pain: no Back pain: no Suprapubic pain/pressure: yes Flank pain: no Fever:  no Vomiting: no Relief with cranberry juice: yes Relief with pyridium: no Status: worse Previous urinary tract infection: no Recurrent urinary tract infection: no Sexual activity: No sexually active/monogomous/practicing safe sex History of sexually transmitted disease: no Penile discharge: no Treatments attempted: cranberry and increasing fluids   Relevant past medical, surgical, family and social history reviewed and updated as indicated. Interim medical history since our last visit reviewed. Allergies and medications reviewed and updated.  Review of Systems  Constitutional:  Negative for fever.  Gastrointestinal:  Positive for abdominal pain. Negative for vomiting.  Genitourinary:  Positive for frequency. Negative for decreased urine volume, dysuria, flank pain, hematuria and urgency.  Musculoskeletal:  Negative for back  pain.    Per HPI unless specifically indicated above     Objective:    BP 125/79 (BP Location: Left Arm, Patient Position: Sitting, Cuff Size: Normal)   Pulse 77   Temp 98.4 F (36.9 C) (Oral)   Ht 5' 7.6 (1.717 m)   Wt 147 lb (66.7 kg)   SpO2 100%   BMI 22.62 kg/m   Wt Readings from Last 3 Encounters:  03/23/24 147 lb (66.7 kg)  01/07/24 145 lb (65.8 kg)  07/08/23 140 lb 6.4 oz (63.7 kg)    Physical Exam Vitals and nursing note reviewed.  Constitutional:      General: She is not in acute distress.    Appearance: Normal appearance. She is normal weight. She is not ill-appearing, toxic-appearing or diaphoretic.  HENT:     Head: Normocephalic.     Right Ear: External ear normal.     Left Ear: External ear normal.     Nose: Nose normal.     Mouth/Throat:     Mouth: Mucous membranes are moist.     Pharynx: Oropharynx is clear.  Eyes:     General:        Right eye: No discharge.        Left eye: No discharge.     Extraocular Movements: Extraocular movements intact.     Conjunctiva/sclera: Conjunctivae normal.     Pupils: Pupils are equal, round, and reactive to light.  Cardiovascular:     Rate and Rhythm: Normal rate and regular rhythm.     Heart sounds: No murmur heard.  Pulmonary:     Effort: Pulmonary effort is normal. No respiratory distress.     Breath sounds: Normal breath sounds. No wheezing or rales.  Abdominal:     General: Abdomen is flat. Bowel sounds are normal. There is no distension.     Palpations: Abdomen is soft. There is no mass.     Tenderness: There is abdominal tenderness in the right lower quadrant and left lower quadrant. There is no right CVA tenderness, left CVA tenderness, guarding or rebound.     Hernia: No hernia is present.     Comments: Very small amount of tenderness on palpation of RLQ and LLQ   Musculoskeletal:     Cervical back: Normal range of motion and neck supple.  Skin:    General: Skin is warm and dry.     Capillary Refill:  Capillary refill takes less than 2 seconds.  Neurological:     General: No focal deficit present.     Mental Status: She is alert and oriented to person, place, and time. Mental status is at baseline.  Psychiatric:        Mood and Affect: Mood normal.        Behavior: Behavior normal.        Thought Content: Thought content normal.        Judgment: Judgment normal.     Results for orders placed or performed in visit on 07/08/23  Cytology - PAP   Collection Time: 07/08/23  3:15 PM  Result Value Ref Range   Adequacy      Satisfactory for evaluation; transformation zone component PRESENT.   Diagnosis      - Negative for intraepithelial lesion or malignancy (NILM)      Assessment & Plan:   Problem List Items Addressed This Visit   None Visit Diagnoses       Bacterial vaginosis    -  Primary   Will treat with Flagyl .  Complete course of medication.  If not improved, can order US  to evaluate symptoms further.   Relevant Medications   metroNIDAZOLE  (FLAGYL ) 500 MG tablet     Dysuria       Relevant Orders   Urinalysis, Routine w reflex microscopic   WET PREP FOR TRICH, YEAST, CLUE        Follow up plan: No follow-ups on file.      "

## 2024-03-24 ENCOUNTER — Ambulatory Visit: Payer: Self-pay | Admitting: Nurse Practitioner

## 2024-04-03 ENCOUNTER — Ambulatory Visit
Admission: RE | Admit: 2024-04-03 | Discharge: 2024-04-03 | Disposition: A | Discharge status: Home / Self Care | Source: Ambulatory Visit | Attending: Nurse Practitioner | Admitting: Nurse Practitioner

## 2024-04-03 DIAGNOSIS — R102 Pelvic and perineal pain unspecified side: Secondary | ICD-10-CM | POA: Insufficient documentation

## 2024-04-06 ENCOUNTER — Encounter: Payer: Self-pay | Admitting: Nurse Practitioner

## 2024-04-06 ENCOUNTER — Ambulatory Visit: Payer: Self-pay | Admitting: Nurse Practitioner

## 2024-04-06 DIAGNOSIS — N8500 Endometrial hyperplasia, unspecified: Secondary | ICD-10-CM

## 2024-04-15 ENCOUNTER — Ambulatory Visit
Admission: RE | Admit: 2024-04-15 | Discharge: 2024-04-15 | Disposition: A | Discharge status: Home / Self Care | Source: Ambulatory Visit | Attending: Nurse Practitioner | Admitting: Nurse Practitioner

## 2024-04-15 DIAGNOSIS — N8500 Endometrial hyperplasia, unspecified: Secondary | ICD-10-CM | POA: Insufficient documentation

## 2024-04-15 MED ORDER — GADOBUTROL 1 MMOL/ML IV SOLN
7.0000 mL | Freq: Once | INTRAVENOUS | Status: AC | PRN
  Administered 2024-04-15: 7 mL via INTRAVENOUS

## 2024-04-16 ENCOUNTER — Ambulatory Visit: Payer: Self-pay | Admitting: Nurse Practitioner

## 2024-04-16 DIAGNOSIS — R102 Pelvic and perineal pain unspecified side: Secondary | ICD-10-CM

## 2024-04-18 ENCOUNTER — Encounter: Payer: Self-pay | Admitting: Nurse Practitioner
